# Patient Record
Sex: Male | Born: 1980 | Race: White | Hispanic: Refuse to answer | Marital: Married | Smoking: Light tobacco smoker
Health system: Southern US, Community
[De-identification: ages and names within clinical notes are randomized; demographics above are authoritative.]

## PROBLEM LIST (undated history)

## (undated) DIAGNOSIS — M545 Low back pain, unspecified: Secondary | ICD-10-CM

## (undated) DIAGNOSIS — R42 Dizziness and giddiness: Secondary | ICD-10-CM

## (undated) DIAGNOSIS — G4733 Obstructive sleep apnea (adult) (pediatric): Secondary | ICD-10-CM

## (undated) DIAGNOSIS — G8929 Other chronic pain: Secondary | ICD-10-CM

## (undated) DIAGNOSIS — R270 Ataxia, unspecified: Secondary | ICD-10-CM

## (undated) DIAGNOSIS — Z87442 Personal history of urinary calculi: Secondary | ICD-10-CM

## (undated) HISTORY — PX: KNEE ARTHROSCOPY: SHX127

## (undated) HISTORY — DX: Ataxia, unspecified: R27.0

---

## 2001-05-17 ENCOUNTER — Emergency Department (HOSPITAL_COMMUNITY): Admission: EM | Admit: 2001-05-17 | Discharge: 2001-05-17 | Payer: Self-pay

## 2001-05-17 ENCOUNTER — Encounter: Payer: Self-pay | Admitting: *Deleted

## 2001-11-02 ENCOUNTER — Encounter: Payer: Self-pay | Admitting: Emergency Medicine

## 2001-11-02 ENCOUNTER — Emergency Department (HOSPITAL_COMMUNITY): Admission: EM | Admit: 2001-11-02 | Discharge: 2001-11-02 | Payer: Self-pay | Admitting: Emergency Medicine

## 2005-03-06 ENCOUNTER — Emergency Department (HOSPITAL_COMMUNITY): Admission: EM | Admit: 2005-03-06 | Discharge: 2005-03-07 | Payer: Self-pay | Admitting: Emergency Medicine

## 2006-06-16 ENCOUNTER — Emergency Department (HOSPITAL_COMMUNITY): Admission: EM | Admit: 2006-06-16 | Discharge: 2006-06-17 | Payer: Self-pay | Admitting: Emergency Medicine

## 2010-05-26 ENCOUNTER — Ambulatory Visit (HOSPITAL_COMMUNITY)
Admission: RE | Admit: 2010-05-26 | Discharge: 2010-05-26 | Disposition: A | Discharge status: Home / Self Care | Payer: Worker's Compensation | Source: Ambulatory Visit | Attending: Emergency Medicine | Admitting: Emergency Medicine

## 2010-05-26 DIAGNOSIS — M545 Low back pain, unspecified: Secondary | ICD-10-CM | POA: Insufficient documentation

## 2010-05-26 DIAGNOSIS — M6281 Muscle weakness (generalized): Secondary | ICD-10-CM | POA: Insufficient documentation

## 2010-05-26 DIAGNOSIS — IMO0001 Reserved for inherently not codable concepts without codable children: Secondary | ICD-10-CM | POA: Insufficient documentation

## 2010-05-28 ENCOUNTER — Ambulatory Visit (HOSPITAL_COMMUNITY)
Admission: RE | Admit: 2010-05-28 | Discharge: 2010-05-28 | Disposition: A | Discharge status: Home / Self Care | Payer: Worker's Compensation | Source: Ambulatory Visit | Attending: *Deleted | Admitting: *Deleted

## 2010-06-02 ENCOUNTER — Ambulatory Visit (HOSPITAL_COMMUNITY)
Admission: RE | Admit: 2010-06-02 | Discharge: 2010-06-02 | Disposition: A | Discharge status: Home / Self Care | Payer: Worker's Compensation | Source: Ambulatory Visit | Attending: Emergency Medicine | Admitting: Emergency Medicine

## 2010-06-02 DIAGNOSIS — IMO0001 Reserved for inherently not codable concepts without codable children: Secondary | ICD-10-CM | POA: Insufficient documentation

## 2010-06-02 DIAGNOSIS — M545 Low back pain, unspecified: Secondary | ICD-10-CM | POA: Insufficient documentation

## 2010-06-02 DIAGNOSIS — M6281 Muscle weakness (generalized): Secondary | ICD-10-CM | POA: Insufficient documentation

## 2010-06-04 ENCOUNTER — Ambulatory Visit (HOSPITAL_COMMUNITY)
Admission: RE | Admit: 2010-06-04 | Discharge: 2010-06-04 | Disposition: A | Discharge status: Home / Self Care | Payer: Worker's Compensation | Source: Ambulatory Visit | Attending: Emergency Medicine | Admitting: Emergency Medicine

## 2010-06-04 DIAGNOSIS — M545 Low back pain, unspecified: Secondary | ICD-10-CM | POA: Insufficient documentation

## 2010-06-04 DIAGNOSIS — M6281 Muscle weakness (generalized): Secondary | ICD-10-CM | POA: Insufficient documentation

## 2010-06-04 DIAGNOSIS — IMO0001 Reserved for inherently not codable concepts without codable children: Secondary | ICD-10-CM | POA: Insufficient documentation

## 2010-06-09 ENCOUNTER — Ambulatory Visit (HOSPITAL_COMMUNITY)
Admission: RE | Admit: 2010-06-09 | Discharge: 2010-06-09 | Disposition: A | Discharge status: Home / Self Care | Payer: Worker's Compensation | Source: Ambulatory Visit | Attending: Emergency Medicine | Admitting: Emergency Medicine

## 2010-06-09 DIAGNOSIS — IMO0001 Reserved for inherently not codable concepts without codable children: Secondary | ICD-10-CM | POA: Insufficient documentation

## 2010-06-09 DIAGNOSIS — M6281 Muscle weakness (generalized): Secondary | ICD-10-CM | POA: Insufficient documentation

## 2010-06-09 DIAGNOSIS — M545 Low back pain, unspecified: Secondary | ICD-10-CM | POA: Insufficient documentation

## 2010-06-11 ENCOUNTER — Ambulatory Visit (HOSPITAL_COMMUNITY)
Admission: RE | Admit: 2010-06-11 | Discharge: 2010-06-11 | Disposition: A | Discharge status: Home / Self Care | Payer: Worker's Compensation | Source: Ambulatory Visit | Attending: *Deleted | Admitting: *Deleted

## 2010-06-17 ENCOUNTER — Ambulatory Visit (HOSPITAL_COMMUNITY)
Admission: RE | Admit: 2010-06-17 | Discharge: 2010-06-17 | Disposition: A | Discharge status: Home / Self Care | Payer: Worker's Compensation | Source: Ambulatory Visit | Attending: *Deleted | Admitting: *Deleted

## 2010-06-18 ENCOUNTER — Ambulatory Visit (HOSPITAL_COMMUNITY)
Admission: RE | Admit: 2010-06-18 | Discharge: 2010-06-18 | Disposition: A | Discharge status: Home / Self Care | Payer: Worker's Compensation | Source: Ambulatory Visit | Attending: *Deleted | Admitting: *Deleted

## 2010-06-23 ENCOUNTER — Ambulatory Visit (HOSPITAL_COMMUNITY)
Admission: RE | Admit: 2010-06-23 | Discharge: 2010-06-23 | Disposition: A | Discharge status: Home / Self Care | Payer: Worker's Compensation | Source: Ambulatory Visit | Attending: *Deleted | Admitting: *Deleted

## 2010-06-25 ENCOUNTER — Ambulatory Visit (HOSPITAL_COMMUNITY)
Admission: RE | Admit: 2010-06-25 | Discharge: 2010-06-25 | Disposition: A | Discharge status: Home / Self Care | Payer: Worker's Compensation | Source: Ambulatory Visit | Attending: *Deleted | Admitting: *Deleted

## 2010-06-30 ENCOUNTER — Ambulatory Visit (HOSPITAL_COMMUNITY)
Admission: RE | Admit: 2010-06-30 | Discharge: 2010-06-30 | Disposition: A | Discharge status: Home / Self Care | Payer: Worker's Compensation | Source: Ambulatory Visit | Attending: *Deleted | Admitting: *Deleted

## 2010-07-02 ENCOUNTER — Ambulatory Visit (HOSPITAL_COMMUNITY)
Admission: RE | Admit: 2010-07-02 | Discharge: 2010-07-02 | Disposition: A | Discharge status: Home / Self Care | Payer: Worker's Compensation | Source: Ambulatory Visit | Attending: *Deleted | Admitting: *Deleted

## 2010-07-07 ENCOUNTER — Ambulatory Visit (HOSPITAL_COMMUNITY)
Admission: RE | Admit: 2010-07-07 | Discharge: 2010-07-07 | Disposition: A | Discharge status: Home / Self Care | Payer: Worker's Compensation | Source: Ambulatory Visit | Attending: Physical Therapy | Admitting: Physical Therapy

## 2010-07-07 DIAGNOSIS — M545 Low back pain, unspecified: Secondary | ICD-10-CM | POA: Insufficient documentation

## 2010-07-07 DIAGNOSIS — IMO0001 Reserved for inherently not codable concepts without codable children: Secondary | ICD-10-CM | POA: Insufficient documentation

## 2010-07-07 DIAGNOSIS — M6281 Muscle weakness (generalized): Secondary | ICD-10-CM | POA: Insufficient documentation

## 2010-07-09 ENCOUNTER — Ambulatory Visit (HOSPITAL_COMMUNITY)
Admission: RE | Admit: 2010-07-09 | Discharge: 2010-07-09 | Disposition: A | Discharge status: Home / Self Care | Payer: Worker's Compensation | Source: Ambulatory Visit | Attending: Physical Therapy | Admitting: Physical Therapy

## 2010-07-09 DIAGNOSIS — M6281 Muscle weakness (generalized): Secondary | ICD-10-CM | POA: Insufficient documentation

## 2010-07-09 DIAGNOSIS — M545 Low back pain, unspecified: Secondary | ICD-10-CM | POA: Insufficient documentation

## 2010-07-09 DIAGNOSIS — IMO0001 Reserved for inherently not codable concepts without codable children: Secondary | ICD-10-CM | POA: Insufficient documentation

## 2010-07-14 ENCOUNTER — Ambulatory Visit (HOSPITAL_COMMUNITY): Payer: Self-pay

## 2010-07-16 ENCOUNTER — Ambulatory Visit (HOSPITAL_COMMUNITY)
Admission: RE | Admit: 2010-07-16 | Discharge: 2010-07-16 | Disposition: A | Discharge status: Home / Self Care | Payer: Worker's Compensation | Source: Ambulatory Visit | Attending: *Deleted | Admitting: *Deleted

## 2010-07-20 ENCOUNTER — Ambulatory Visit (HOSPITAL_COMMUNITY): Payer: Worker's Compensation | Admitting: Physical Therapy

## 2010-07-22 ENCOUNTER — Ambulatory Visit (HOSPITAL_COMMUNITY)
Admission: RE | Admit: 2010-07-22 | Discharge: 2010-07-22 | Disposition: A | Discharge status: Home / Self Care | Payer: Worker's Compensation | Source: Ambulatory Visit | Attending: Physical Therapy | Admitting: Physical Therapy

## 2010-07-22 DIAGNOSIS — IMO0001 Reserved for inherently not codable concepts without codable children: Secondary | ICD-10-CM | POA: Insufficient documentation

## 2010-07-22 DIAGNOSIS — M6281 Muscle weakness (generalized): Secondary | ICD-10-CM | POA: Insufficient documentation

## 2010-07-22 DIAGNOSIS — M545 Low back pain, unspecified: Secondary | ICD-10-CM | POA: Insufficient documentation

## 2010-08-21 NOTE — Consult Note (Signed)
NAMEVIRGLE, Clayton Jensen                ACCOUNT NO.:  [id number]   MEDICAL RECORD NO.:  [id number]          PATIENT TYPE:  EMS   LOCATION:  MAJO                         FACILITY:  [facility]   PHYSICIAN:  Dionne Ano. Gramig III, M.D.DATE OF BIRTH:  [birth year]   DATE OF CONSULTATION:  06/17/2006  DATE OF DISCHARGE:  06/17/2006                                 CONSULTATION   The patient is a 30 year old male who sustained an injury to his right  index finger dorsal aspect with a fine coil June 15, 2006 while at  work.  The coil was removed.  Following this, the patient then underwent  a day where he did not have significant problems but noted intermittent  pain.  Today, he was concerned about this and presented to [facility].  He was seen and noted to be afebrile, and there was a  question of possible joint space infection.  I have discussed this with  him at length and reviewed his notes.   The coil-type piece is a refrigerator coil.  He had two punctures on the  dorsal aspect of his hands, and his tetanus shot is up to date as he had  a tetanus shot one year ago.   PAST MEDICAL HISTORY:  Past medical history includes low back pain.   PAST SURGICAL HISTORY:  No major surgeries.   ALLERGIES:  DARVOCET AND PERCOCET ARE DIFFICULT FOR HIM TO TOLERATE;  HOWEVER, VICODIN DOES WELL.   SOCIAL HISTORY:  Smokes one pack per day.  He does not drink.  His has a  fiance who is with him today.   REVIEW OF SYSTEMS:  His 14-point review of systems is negative.   PHYSICAL EXAMINATION:  This patient has a weight of 295 pounds.  He had  stable vital signs.  He is afebrile today.  He has no signs of infection deeply.  He does have active range of  motion.  His flexor apparatus is completely nontender with palpation and  provocative testing.  MCP and DIP joints are nontender.  The PIP has a  slight bit of tenderness with palpation where the two dorsal punctures  occurred distal to that PIP  region of the hand.  The patient does have  some very mild pain with palpation.  Passive range of motion testing is  not particularly tender.  There is no signs of gross instability or  excessive tendon injury.  I have reviewed this at length and the  findings.  His x-rays are reviewed in detail.  These came from  [facility] and I have reviewed them.   IMPRESSION:  Puncture wound dorsal aspect right index finger with pain  and no evidence of advanced joint sepsis at the present time.   PLAN:  I discussed the patient's findings.  At the present time I would  recommend Rocephin 2 g IV which was given today.  Following this, the  patient will be started on Augmentin 875 mg 1 p.o. b.i.d.  I have given  him Vicodin for pain dispensed number 45.  I have asked the  patient to  notify me if there is any swelling, erythema, cellulitis, or worsening.  At the present time, he has no cellulitis, no Kanavel's findings, nor  does he have any puncture wound volarly.  There is no abscess to drain.  It is only a puncture wound  which we  will cover with antibiotics if  there is  no redness or evidence of advanced infectious state.  I have discussed  these issues with him at length and will proceed accordingly.  He  understands to notify me immediately if he has any problems.  Otherwise,  he will return to see me in the office early next week.           ______________________________  Dionne Ano. Everlene Other, M.D.     Nash Mantis  D:  06/17/2006  T:  06/17/2006  Job:  [id number]

## 2014-04-03 ENCOUNTER — Telehealth: Payer: Self-pay | Admitting: Family Medicine

## 2014-04-03 NOTE — Telephone Encounter (Signed)
Pt states he does not get his ear drops through us but he is running out He is currently out an wants to know if we can just send him in a new  Script to [facility],   Neomycin polynyxyn  hydrocoritizone OTIC solution (??) unsure on the name  (steroid ear solution)  Nothing listed in Epic, has NOT been here since 11/13  Advised pt he would most likely need to have an appt He states he is working an is unable to come

## 2014-04-03 NOTE — Telephone Encounter (Signed)
Patient does not have an ear doctor and was transferred to the front desk to schedule appointment.

## 2014-04-03 NOTE — Telephone Encounter (Signed)
Needs to contact his ear doc

## 2014-04-04 ENCOUNTER — Ambulatory Visit: Payer: Self-pay | Admitting: Family Medicine

## 2014-10-17 ENCOUNTER — Emergency Department (HOSPITAL_COMMUNITY): Payer: Managed Care, Other (non HMO)

## 2014-10-17 ENCOUNTER — Emergency Department (HOSPITAL_COMMUNITY)
Admission: EM | Admit: 2014-10-17 | Discharge: 2014-10-17 | Disposition: A | Discharge status: Home / Self Care | Payer: Managed Care, Other (non HMO) | Attending: Emergency Medicine | Admitting: Emergency Medicine

## 2014-10-17 ENCOUNTER — Encounter (HOSPITAL_COMMUNITY): Payer: Self-pay

## 2014-10-17 DIAGNOSIS — N508 Other specified disorders of male genital organs: Secondary | ICD-10-CM | POA: Diagnosis present

## 2014-10-17 DIAGNOSIS — Z720 Tobacco use: Secondary | ICD-10-CM | POA: Diagnosis not present

## 2014-10-17 DIAGNOSIS — N200 Calculus of kidney: Secondary | ICD-10-CM | POA: Insufficient documentation

## 2014-10-17 DIAGNOSIS — R109 Unspecified abdominal pain: Secondary | ICD-10-CM

## 2014-10-17 DIAGNOSIS — N50812 Left testicular pain: Secondary | ICD-10-CM

## 2014-10-17 LAB — BASIC METABOLIC PANEL
Anion gap: 8 (ref 5–15)
BUN: 15 mg/dL (ref 6–20)
CO2: 28 mmol/L (ref 22–32)
Calcium: 8.9 mg/dL (ref 8.9–10.3)
Chloride: 106 mmol/L (ref 101–111)
Creatinine, Ser: 1.24 mg/dL (ref 0.61–1.24)
GLUCOSE: 112 mg/dL — AB (ref 65–99)
POTASSIUM: 3.9 mmol/L (ref 3.5–5.1)
Sodium: 142 mmol/L (ref 135–145)

## 2014-10-17 LAB — URINE MICROSCOPIC-ADD ON

## 2014-10-17 LAB — URINALYSIS, ROUTINE W REFLEX MICROSCOPIC
Bilirubin Urine: NEGATIVE
GLUCOSE, UA: NEGATIVE mg/dL
Ketones, ur: NEGATIVE mg/dL
Leukocytes, UA: NEGATIVE
Nitrite: NEGATIVE
PROTEIN: 30 mg/dL — AB
Specific Gravity, Urine: >1.03 — ABNORMAL HIGH (ref 1.005–1.030)
Urobilinogen, UA: 0.2 mg/dL (ref 0.0–1.0)
pH: 6 (ref 5.0–8.0)

## 2014-10-17 LAB — CBC WITH DIFFERENTIAL/PLATELET
BASOS ABS: 0.1 10*3/uL (ref 0.0–0.1)
BASOS PCT: 0 % (ref 0–1)
EOS PCT: 3 % (ref 0–5)
Eosinophils Absolute: 0.3 10*3/uL (ref 0.0–0.7)
HCT: 41.2 % (ref 39.0–52.0)
Hemoglobin: 14.4 g/dL (ref 13.0–17.0)
LYMPHS ABS: 3.3 10*3/uL (ref 0.7–4.0)
LYMPHS PCT: 29 % (ref 12–46)
MCH: 29 pg (ref 26.0–34.0)
MCHC: 35 g/dL (ref 30.0–36.0)
MCV: 82.9 fL (ref 78.0–100.0)
MONO ABS: 0.9 10*3/uL (ref 0.1–1.0)
Monocytes Relative: 8 % (ref 3–12)
Neutro Abs: 6.9 10*3/uL (ref 1.7–7.7)
Neutrophils Relative %: 60 % (ref 43–77)
Platelets: 194 10*3/uL (ref 150–400)
RBC: 4.97 MIL/uL (ref 4.22–5.81)
RDW: 13.4 % (ref 11.5–15.5)
WBC: 11.5 10*3/uL — ABNORMAL HIGH (ref 4.0–10.5)

## 2014-10-17 MED ORDER — IBUPROFEN 800 MG PO TABS: 800.0000 mg | ORAL_TABLET | Freq: Three times a day (TID) | ORAL | Status: DC | PRN

## 2014-10-17 MED ORDER — SODIUM CHLORIDE 0.9 % IV SOLN
INTRAVENOUS | Status: DC
  Administered 2014-10-17: 03:00:00 via INTRAVENOUS

## 2014-10-17 MED ORDER — OXYCODONE-ACETAMINOPHEN 5-325 MG PO TABS: 1.0000 | ORAL_TABLET | Freq: Four times a day (QID) | ORAL | Status: DC | PRN

## 2014-10-17 MED ORDER — ONDANSETRON 4 MG PO TBDP: 4.0000 mg | ORAL_TABLET | Freq: Three times a day (TID) | ORAL | Status: DC | PRN

## 2014-10-17 MED ORDER — ONDANSETRON HCL 4 MG/2ML IJ SOLN
4.0000 mg | Freq: Once | INTRAMUSCULAR | Status: AC
  Administered 2014-10-17: 4 mg via INTRAVENOUS
  Filled 2014-10-17: qty 2

## 2014-10-17 MED ORDER — MORPHINE SULFATE 4 MG/ML IJ SOLN
4.0000 mg | Freq: Once | INTRAMUSCULAR | Status: AC
  Administered 2014-10-17: 4 mg via INTRAVENOUS
  Filled 2014-10-17: qty 1

## 2014-10-17 MED ORDER — TAMSULOSIN HCL 0.4 MG PO CAPS: 0.4000 mg | ORAL_CAPSULE | Freq: Every day | ORAL | Status: DC

## 2014-10-17 MED ORDER — HYDROMORPHONE HCL 1 MG/ML IJ SOLN
1.0000 mg | Freq: Once | INTRAMUSCULAR | Status: AC
  Administered 2014-10-17: 1 mg via INTRAVENOUS
  Filled 2014-10-17: qty 1

## 2014-10-17 MED ORDER — KETOROLAC TROMETHAMINE 30 MG/ML IJ SOLN
30.0000 mg | Freq: Once | INTRAMUSCULAR | Status: AC
  Administered 2014-10-17: 30 mg via INTRAVENOUS
  Filled 2014-10-17: qty 1

## 2014-10-17 NOTE — ED Notes (Signed)
Pt states he awoke with pain to the left testicle, states he had torsion to the right testicle last year and it self-resolved.

## 2014-10-17 NOTE — Discharge Instructions (Signed)

## 2014-10-17 NOTE — ED Provider Notes (Signed)
TIME SEEN: 2:40 AM   CHIEF COMPLAINT: Left testicle pain  HPI: Pt is a 34 y.o. male with previous history of right testicular torsion that he reports resolved during ultrasound but never followed up with a urologist who presents to the emergency department with sudden onset severe left-sided sharp testicular pain that started just prior to arrival. States this feels similar to his prior torsion. He does have some pain in his lower abdomen as well. No history of prior kidney stone. No dysuria or hematuria. States he was recently treated for urinary tract infection - was on anti-medics for 5 days and finished them yesterday. Patient and wife do not recall the name of this medication. No history of STDs. Is sexually active. Denies penile discharge. Denies gross hematuria. States when he had his urinary tract infection he had fever, elevated white blood cell count and blood in his urine.  ROS: See HPI Constitutional: no fever  Eyes: no drainage  ENT: no runny nose   Cardiovascular:  no chest pain  Resp: no SOB  GI: no vomiting GU: no dysuria Integumentary: no rash  Allergy: no hives  Musculoskeletal: no leg swelling  Neurological: no slurred speech ROS otherwise negative  PAST MEDICAL HISTORY/PAST SURGICAL HISTORY:  History reviewed. No pertinent past medical history.  MEDICATIONS:  Prior to Admission medications   Not on File    ALLERGIES:  No Known Allergies  SOCIAL HISTORY:  History  Substance Use Topics  . Smoking status: Current Every Day Smoker  . Smokeless tobacco: Not on file  . Alcohol Use: Yes    FAMILY HISTORY: No family history on file.  EXAM: BP 147/91 mmHg  Pulse 70  Temp(Src) 97.6 F (36.4 C)  Resp 20  SpO2 100% CONSTITUTIONAL: Alert and oriented and responds appropriately to questions. Appears uncomfortable, afebrile, nontoxic HEAD: Normocephalic EYES: Conjunctivae clear, PERRL ENT: normal nose; no rhinorrhea; moist mucous membranes; pharynx without  lesions noted NECK: Supple, no meningismus, no LAD  CARD: RRR; S1 and S2 appreciated; no murmurs, no clicks, no rubs, no gallops RESP: Normal chest excursion without splinting or tachypnea; breath sounds clear and equal bilaterally; no wheezes, no rhonchi, no rales, no hypoxia or respiratory distress, speaking full sentences ABD/GI: Normal bowel sounds; non-distended; soft, minimally tender in the left lower quadrant, no rebound, no guarding, no peritoneal signs GU:  Normal external genitalia, circumcised male, no blood or discharge at the urethral meatus, right testicle is nontender and appears normal, no scrotal masses, left testicle is tender and appears to be high riding with decreased cremasteric reflex bilaterally, no testicular mass appreciated BACK:  The back appears normal and is non-tender to palpation, there is no CVA tenderness EXT: Normal ROM in all joints; non-tender to palpation; no edema; normal capillary refill; no cyanosis, no calf tenderness or swelling    SKIN: Normal color for age and race; warm NEURO: Moves all extremities equally, sensation to light touch intact diffusely, cranial nerves II through XII intact PSYCH: The patient's mood and manner are appropriate. Grooming and personal hygiene are appropriate.  MEDICAL DECISION MAKING: 2:45 AM  Patient here with concern for testicular torsion. Patient seen immediately upon arrival to the emergency department. Symptoms started just prior to arrival. Patient reports he attempted to reduce a torsion at home now relief. Will give pain medicine and have a called ultrasound technician in emergently for scrotal Doppler. We'll also obtain urinalysis, urine gonorrhea and chlamydia tests. Will obtain labs, give IV pain and nausea medicine.  ED  PROGRESS: 3:40 AM  US performed. Awaiting official radiology read but he does have bilateral blood flow. Labs unremarkable. Urine pending.   4:00 AM  Pt's testicular ultrasound is normal with normal  blood flow. He is still having significant pain after morphine and Dilaudid. We'll obtain CT of his abdomen pelvis to evaluate for possible stone, pyelonephritis. Urinalysis is pending. We'll give Toradol.  4:15 AM  Pt's urine shows hematuria and many bacteria with no other sign of infection. There are also  few squamous cells. Culture pending.  5:00 AM  Pt reports pain significantly improved after Toradol. CT scan shows a distal 3.5 mm left ureteral stone with moderate proximal obstruction. We'll discharge patient home with urine strainer, pain and nausea medication and urology follow-up information. Discussed return precautions. He verbalizes understanding and is comfortable with this plan.    Layla MawKristen N Ward, DO 10/17/14 [phone]

## 2014-10-18 LAB — URINE CULTURE: CULTURE: NO GROWTH

## 2014-10-18 LAB — GC/CHLAMYDIA PROBE AMP (~~LOC~~) NOT AT ARMC
Chlamydia: NEGATIVE
Neisseria Gonorrhea: NEGATIVE

## 2014-10-23 ENCOUNTER — Other Ambulatory Visit: Payer: Self-pay | Admitting: Nurse Practitioner

## 2014-10-23 MED ORDER — SCOPOLAMINE 1 MG/3DAYS TD PT72: 1.0000 | MEDICATED_PATCH | TRANSDERMAL | Status: DC

## 2014-10-23 MED ORDER — AZITHROMYCIN 250 MG PO TABS: ORAL_TABLET | ORAL | Status: DC

## 2015-05-07 ENCOUNTER — Ambulatory Visit (INDEPENDENT_AMBULATORY_CARE_PROVIDER_SITE_OTHER): Payer: Managed Care, Other (non HMO) | Admitting: Nurse Practitioner

## 2015-05-07 ENCOUNTER — Encounter: Payer: Self-pay | Admitting: Nurse Practitioner

## 2015-05-07 VITALS — BP 130/78 | Temp 99.0°F | Wt 367.8 lb

## 2015-05-07 DIAGNOSIS — M1000 Idiopathic gout, unspecified site: Secondary | ICD-10-CM

## 2015-05-07 DIAGNOSIS — H60392 Other infective otitis externa, left ear: Secondary | ICD-10-CM | POA: Diagnosis not present

## 2015-05-07 DIAGNOSIS — M79672 Pain in left foot: Secondary | ICD-10-CM | POA: Diagnosis not present

## 2015-05-07 DIAGNOSIS — M109 Gout, unspecified: Secondary | ICD-10-CM

## 2015-05-07 MED ORDER — NEOMYCIN-POLYMYXIN-HC 3.5-10000-1 OT SUSP: 4.0000 [drp] | Freq: Three times a day (TID) | OTIC | Status: DC

## 2015-05-08 ENCOUNTER — Encounter: Payer: Self-pay | Admitting: Nurse Practitioner

## 2015-05-08 LAB — SEDIMENTATION RATE: Sed Rate: 3 mm/hr (ref 0–15)

## 2015-05-08 LAB — URIC ACID: Uric Acid: 7 mg/dL (ref 3.7–8.6)

## 2015-05-08 NOTE — Progress Notes (Signed)
Subjective:   Presents for complaints of a flareup of his left otitis externa. States this occurs about twice a year. Uses Cortisporin otic drops which helps. No fever. No cough runny nose headache or sore throat. At the very end of the visit as patient was leaving mentioned that he has been having some pain at the base of his left great toe in the "ball of his foot". No specific history of injury. Has been going on for about 9 days. No changes in his footwear. Patient concerned because he has an extreme tolerance to pain , states were some people have 10, his pain would likely be two.  Objective:   BP 130/78 mmHg  Temp(Src) 99 F (37.2 C) (Oral)  Wt 367 lb 12.8 oz (166.833 kg)  NAD. Alert, oriented. Right ear canal erythema noted about halfway down the canal, no drainage. Left ear canal nonerythematous with mild edema. TMs can be visualized bilateral , mild clear effusion, no erythema. Left foot toes warm with good capillary refill. Strong DP pulse.  Mild tenderness noted at the base of the left great toe at the bottom of the foot , mild tenderness along the lateral part , no bunion noted. Tenderness with movement of the left great toe.  Assessment: Otitis, externa, infective, left  Podagra - Plan: Uric acid, Sedimentation rate  Pain in left foot  Plan:  Meds ordered this encounter  Medications  . neomycin-polymyxin-hydrocortisone (CORTISPORIN) 3.5-10000-1 otic suspension    Sig: Place 4 drops into both ears 3 (three) times daily.    Dispense:  10 mL    Refill:  0    Order Specific Question:  Supervising Provider    Answer:  Merlyn Albert [[id number]]    as a precaution, screen for gout. If this test is negative, recommend x-ray and/or referral to podiatry. Call back if ear pain persist.

## 2015-05-12 ENCOUNTER — Other Ambulatory Visit: Payer: Self-pay | Admitting: Nurse Practitioner

## 2015-05-12 ENCOUNTER — Telehealth: Payer: Self-pay | Admitting: Nurse Practitioner

## 2015-05-12 MED ORDER — AMOXICILLIN 500 MG PO CAPS: 500.0000 mg | ORAL_CAPSULE | Freq: Three times a day (TID) | ORAL | Status: DC

## 2015-05-12 NOTE — Telephone Encounter (Signed)
Patient says he was given neomycin-polymyxin-hydrocortisone (CORTISPORIN) 3.5-10000-1 otic suspension on 05/07/15 for an ear infection.  He says he still is having a lot of pain and wants to know if there is something else we can try him on?    [facility]

## 2015-05-12 NOTE — Telephone Encounter (Signed)
I will send in oral antibiotics. Call back by end of week if no better. Also, recommend OTC Nasonex AQ daily as directed for ear pressure and fluid.

## 2015-05-12 NOTE — Telephone Encounter (Signed)
Called patient and informed her per Nathaneil Canary- will send in oral antibiotics. Call back by end of week if no better. Also, recommend OTC Nasonex AQ daily as directed for ear pressure and fluid. Patient verbalized understanding.

## 2015-10-20 ENCOUNTER — Telehealth: Payer: Self-pay

## 2015-10-20 NOTE — Telephone Encounter (Signed)
Patient's wife called and stated that patient just had a screwdriver go through his hand. He is up to date on his tetanus shot and wanted to know if antibiotics could be called in. Per Dr. Lorin PicketScott- patient needs to come in today to be seen for his injuries and antibiotic therapy. Patient stated that he could not come in today because he is working. Advised patient to go to the nearest ER for immediate treatment. Patient verbalized understanding.

## 2015-12-15 ENCOUNTER — Other Ambulatory Visit: Payer: Self-pay | Admitting: Nurse Practitioner

## 2016-09-01 ENCOUNTER — Encounter: Payer: Self-pay | Admitting: Family Medicine

## 2016-09-01 ENCOUNTER — Ambulatory Visit (INDEPENDENT_AMBULATORY_CARE_PROVIDER_SITE_OTHER): Payer: Managed Care, Other (non HMO) | Admitting: Family Medicine

## 2016-09-01 VITALS — BP 136/84 | Ht 72.0 in | Wt 380.7 lb

## 2016-09-01 DIAGNOSIS — I878 Other specified disorders of veins: Secondary | ICD-10-CM | POA: Diagnosis not present

## 2016-09-01 DIAGNOSIS — R5383 Other fatigue: Secondary | ICD-10-CM

## 2016-09-01 DIAGNOSIS — Z13220 Encounter for screening for lipoid disorders: Secondary | ICD-10-CM

## 2016-09-01 DIAGNOSIS — Z131 Encounter for screening for diabetes mellitus: Secondary | ICD-10-CM | POA: Diagnosis not present

## 2016-09-01 MED ORDER — TRIAMTERENE-HCTZ 37.5-25 MG PO CAPS
ORAL_CAPSULE | ORAL | 2 refills | Status: DC
Start: 2016-09-01 — End: 2017-04-08

## 2016-09-01 NOTE — Progress Notes (Signed)
   Subjective:    Patient ID: Clayton HarmanMichael H Jensen, male    DOB: May 04, 1980, 36 y.o.   MRN: [id number]  HPI Patient arrives with c/o swelling in legs for years- worse for 1.5 weeks.  At work currently stationary  Too much standing or sitting cuss ankles to swell  Ankles uncomfortable last wk   uncomfortable  No shortness of breath no chest pain or leg swelling tendency has increased patient's obesity has increased in recent years.  Not a big salt person   Review of Systems No headache, no major weight loss or weight gain, no chest pain no back pain abdominal pain no change in bowel habits complete ROS otherwise negative     Objective:   Physical Exam  Morbid obesity present  Alert vitals stable, NAD. Blood pressure good on repeat. HEENT normal. Lungs clear. Heart regular rate and rhythm. Bilateral ankle edema pulses strong sensation intact      Assessment & Plan:  Impression venous stasis secondary to morbid obesity symptomatic and painful at times plan Dyazide no more than 3 tablets per week symptom care discussed recommend screening blood work plus wellness exam

## 2016-09-30 ENCOUNTER — Encounter: Payer: Managed Care, Other (non HMO) | Admitting: Family Medicine

## 2017-04-04 ENCOUNTER — Emergency Department (HOSPITAL_COMMUNITY): Payer: 59

## 2017-04-04 ENCOUNTER — Encounter (HOSPITAL_COMMUNITY): Payer: Self-pay | Admitting: Emergency Medicine

## 2017-04-04 ENCOUNTER — Other Ambulatory Visit: Payer: Self-pay

## 2017-04-04 ENCOUNTER — Inpatient Hospital Stay (HOSPITAL_COMMUNITY)
Admission: EM | Admit: 2017-04-04 | Discharge: 2017-04-08 | DRG: 098 | Disposition: A | Discharge status: Home / Self Care | Payer: 59 | Attending: Infectious Disease | Admitting: Infectious Disease

## 2017-04-04 ENCOUNTER — Observation Stay (HOSPITAL_COMMUNITY): Payer: 59

## 2017-04-04 DIAGNOSIS — R4182 Altered mental status, unspecified: Secondary | ICD-10-CM

## 2017-04-04 DIAGNOSIS — F17210 Nicotine dependence, cigarettes, uncomplicated: Secondary | ICD-10-CM | POA: Diagnosis present

## 2017-04-04 DIAGNOSIS — R9401 Abnormal electroencephalogram [EEG]: Secondary | ICD-10-CM | POA: Diagnosis present

## 2017-04-04 DIAGNOSIS — K7689 Other specified diseases of liver: Secondary | ICD-10-CM | POA: Diagnosis not present

## 2017-04-04 DIAGNOSIS — Z825 Family history of asthma and other chronic lower respiratory diseases: Secondary | ICD-10-CM

## 2017-04-04 DIAGNOSIS — R739 Hyperglycemia, unspecified: Secondary | ICD-10-CM | POA: Diagnosis present

## 2017-04-04 DIAGNOSIS — Z8669 Personal history of other diseases of the nervous system and sense organs: Secondary | ICD-10-CM

## 2017-04-04 DIAGNOSIS — Z8661 Personal history of infections of the central nervous system: Secondary | ICD-10-CM | POA: Diagnosis present

## 2017-04-04 DIAGNOSIS — Z87442 Personal history of urinary calculi: Secondary | ICD-10-CM

## 2017-04-04 DIAGNOSIS — A86 Unspecified viral encephalitis: Principal | ICD-10-CM | POA: Diagnosis present

## 2017-04-04 DIAGNOSIS — F064 Anxiety disorder due to known physiological condition: Secondary | ICD-10-CM | POA: Diagnosis present

## 2017-04-04 DIAGNOSIS — R270 Ataxia, unspecified: Secondary | ICD-10-CM | POA: Diagnosis not present

## 2017-04-04 DIAGNOSIS — G4733 Obstructive sleep apnea (adult) (pediatric): Secondary | ICD-10-CM | POA: Diagnosis present

## 2017-04-04 DIAGNOSIS — R945 Abnormal results of liver function studies: Secondary | ICD-10-CM

## 2017-04-04 DIAGNOSIS — R740 Nonspecific elevation of levels of transaminase and lactic acid dehydrogenase [LDH]: Secondary | ICD-10-CM | POA: Diagnosis present

## 2017-04-04 DIAGNOSIS — Z833 Family history of diabetes mellitus: Secondary | ICD-10-CM

## 2017-04-04 DIAGNOSIS — G0490 Encephalitis and encephalomyelitis, unspecified: Secondary | ICD-10-CM | POA: Diagnosis present

## 2017-04-04 DIAGNOSIS — Z6841 Body Mass Index (BMI) 40.0 and over, adult: Secondary | ICD-10-CM

## 2017-04-04 DIAGNOSIS — R160 Hepatomegaly, not elsewhere classified: Secondary | ICD-10-CM | POA: Diagnosis present

## 2017-04-04 DIAGNOSIS — R2681 Unsteadiness on feet: Secondary | ICD-10-CM | POA: Diagnosis present

## 2017-04-04 DIAGNOSIS — R7401 Elevation of levels of liver transaminase levels: Secondary | ICD-10-CM

## 2017-04-04 DIAGNOSIS — G9340 Encephalopathy, unspecified: Secondary | ICD-10-CM | POA: Diagnosis present

## 2017-04-04 DIAGNOSIS — G250 Essential tremor: Secondary | ICD-10-CM

## 2017-04-04 HISTORY — DX: Obstructive sleep apnea (adult) (pediatric): G47.33

## 2017-04-04 HISTORY — DX: Altered mental status, unspecified: R41.82

## 2017-04-04 HISTORY — DX: Other chronic pain: G89.29

## 2017-04-04 HISTORY — DX: Low back pain, unspecified: M54.50

## 2017-04-04 HISTORY — DX: Personal history of urinary calculi: Z87.442

## 2017-04-04 HISTORY — DX: Low back pain: M54.5

## 2017-04-04 LAB — COMPREHENSIVE METABOLIC PANEL
ALT: 133 U/L — AB (ref 17–63)
AST: 91 U/L — AB (ref 15–41)
Albumin: 3.4 g/dL — ABNORMAL LOW (ref 3.5–5.0)
Alkaline Phosphatase: 70 U/L (ref 38–126)
Anion gap: 10 (ref 5–15)
BUN: 11 mg/dL (ref 6–20)
CHLORIDE: 104 mmol/L (ref 101–111)
CO2: 25 mmol/L (ref 22–32)
CREATININE: 0.77 mg/dL (ref 0.61–1.24)
Calcium: 8.9 mg/dL (ref 8.9–10.3)
GFR calc non Af Amer: >60 mL/min (ref >60)
Glucose, Bld: 107 mg/dL — ABNORMAL HIGH (ref 65–99)
POTASSIUM: 3.7 mmol/L (ref 3.5–5.1)
SODIUM: 139 mmol/L (ref 135–145)
Total Bilirubin: 0.9 mg/dL (ref 0.3–1.2)
Total Protein: 7.1 g/dL (ref 6.5–8.1)

## 2017-04-04 LAB — AMMONIA: AMMONIA: 38 umol/L — AB (ref 9–35)

## 2017-04-04 LAB — CBC WITH DIFFERENTIAL/PLATELET
Basophils Absolute: 0 10*3/uL (ref 0.0–0.1)
Basophils Relative: 0 %
EOS ABS: 0.1 10*3/uL (ref 0.0–0.7)
Eosinophils Relative: 1 %
HCT: 44 % (ref 39.0–52.0)
HEMOGLOBIN: 15 g/dL (ref 13.0–17.0)
LYMPHS ABS: 2.8 10*3/uL (ref 0.7–4.0)
LYMPHS PCT: 31 %
MCH: 29 pg (ref 26.0–34.0)
MCHC: 34.1 g/dL (ref 30.0–36.0)
MCV: 85.1 fL (ref 78.0–100.0)
MONOS PCT: 6 %
Monocytes Absolute: 0.6 10*3/uL (ref 0.1–1.0)
NEUTROS PCT: 62 %
Neutro Abs: 5.6 10*3/uL (ref 1.7–7.7)
Platelets: 160 10*3/uL (ref 150–400)
RBC: 5.17 MIL/uL (ref 4.22–5.81)
RDW: 14.1 % (ref 11.5–15.5)
WBC: 9.2 10*3/uL (ref 4.0–10.5)

## 2017-04-04 LAB — URINALYSIS, ROUTINE W REFLEX MICROSCOPIC
BACTERIA UA: NONE SEEN
BILIRUBIN URINE: NEGATIVE
Glucose, UA: NEGATIVE mg/dL
KETONES UR: NEGATIVE mg/dL
Leukocytes, UA: NEGATIVE
Nitrite: NEGATIVE
PROTEIN: 100 mg/dL — AB
Specific Gravity, Urine: 1.017 (ref 1.005–1.030)
pH: 6 (ref 5.0–8.0)

## 2017-04-04 LAB — CBG MONITORING, ED: GLUCOSE-CAPILLARY: 102 mg/dL — AB (ref 65–99)

## 2017-04-04 LAB — RAPID URINE DRUG SCREEN, HOSP PERFORMED
Amphetamines: NOT DETECTED
BENZODIAZEPINES: NOT DETECTED
Barbiturates: NOT DETECTED
COCAINE: NOT DETECTED
OPIATES: NOT DETECTED
Tetrahydrocannabinol: NOT DETECTED

## 2017-04-04 LAB — MAGNESIUM: MAGNESIUM: 1.6 mg/dL — AB (ref 1.7–2.4)

## 2017-04-04 LAB — SEDIMENTATION RATE: Sed Rate: 6 mm/hr (ref 0–16)

## 2017-04-04 LAB — TSH: TSH: 3.859 u[IU]/mL (ref 0.350–4.500)

## 2017-04-04 LAB — VITAMIN B12: Vitamin B-12: 646 pg/mL (ref 180–914)

## 2017-04-04 LAB — ETHANOL: Alcohol, Ethyl (B): <10 mg/dL (ref <10)

## 2017-04-04 MED ORDER — SODIUM CHLORIDE 0.9 % IV BOLUS (SEPSIS)
1000.0000 mL | Freq: Once | INTRAVENOUS | Status: AC
  Administered 2017-04-04: 1000 mL via INTRAVENOUS

## 2017-04-04 MED ORDER — LORAZEPAM 2 MG/ML IJ SOLN: 2.0000 mg | Freq: Once | INTRAMUSCULAR | Status: DC

## 2017-04-04 MED ORDER — MECLIZINE HCL 12.5 MG PO TABS
25.0000 mg | ORAL_TABLET | Freq: Once | ORAL | Status: AC
  Administered 2017-04-04: 25 mg via ORAL
  Filled 2017-04-04: qty 2

## 2017-04-04 MED ORDER — SODIUM CHLORIDE 0.9 % IV SOLN
INTRAVENOUS | Status: DC
  Administered 2017-04-04 – 2017-04-08 (×6): via INTRAVENOUS

## 2017-04-04 NOTE — ED Notes (Addendum)
[facility] [place],RN at this time. Pt left Private vehicle transfer to [facility]

## 2017-04-04 NOTE — ED Notes (Signed)
Patient transported to MRI 

## 2017-04-04 NOTE — ED Triage Notes (Signed)
Pt sent here from [facility] as transfer for MRI. Family reports pt being fatigued and not acting himself since Sunday morning. Appears in no acute distress at this time.

## 2017-04-04 NOTE — ED Notes (Signed)
MRI called concerning patient having extreme claustrophobia.  Patient wants to talk with provider before having MRI done again.  Patient will be brought back to see PA before having MRI done.

## 2017-04-04 NOTE — H&P (Signed)
TRH H&P   Patient Demographics:    Clayton Jensen, is a 36 y.o. male  MRN: [id number]   DOB - 1980-09-21  Admit Date - 04/04/2017  Outpatient Primary MD for the patient is Wolfgang Phoenix Grace Bushy, MD  Referring MD/NP/PA:   Margarita Mail  Outpatient Specialists:     Patient coming from: home  Chief Complaint  Patient presents with  . Dizziness      HPI:    Clayton Jensen  is a 36 y.o. male, started feeling tired and head felt swimmy on Saturday nite.  Sunday stayed home , slept most of the day. This am his wife woke him up and he got up groggy and something happened in the shower,  He hurt his left bumping on the bathtub.  His wife went to check on him and he was putting on 5 shirts. Went to [employer] due to confusion per family.  He apparently thought it was February earlier.  axox2 (person, place).    In ED,  MRI brain=> no acute process Urinalysis negative  Na 139, K 3.7 Ast 91, Alt 133 Alb 3.4   Pt will be admitted for observation of altered mental status.  ?     Review of systems:    In addition to the HPI above,  No Fever-chills, No Headache, No changes with Vision or hearing, No problems swallowing food or Liquids, No Chest pain, Cough or Shortness of Breath, No Abdominal pain, No Nausea or Vommitting, Bowel movements are regular, No Blood in stool or Urine, No dysuria, No new skin rashes or bruises, No new joints pains-aches,  No new weakness, tingling, numbness in any extremity, No recent weight gain or loss, No polyuria, polydypsia or polyphagia, No significant Mental Stressors.  A full 10 point Review of Systems was done, except as stated above, all other Review of Systems were negative.   With Past History of the following :    Past Medical History:  Diagnosis Date  . OSA (obstructive sleep apnea)    clinical diagnosis, not officially diagnosed  per family      Past Surgical History:  Procedure Laterality Date  . KNEE SURGERY Left       Social History:     Social History   Tobacco Use  . Smoking status: Current Every Day Smoker  . Smokeless tobacco: Never Used  Substance Use Topics  . Alcohol use: No    Frequency: Never     Lives - at home with wife  Mobility - walks by  Self.     Family History :     Family History  Problem Relation Age of Onset  . COPD Mother   . Atrial fibrillation Mother   . Diabetes Mother   . Diabetes Father       Home Medications:   Prior to Admission medications   Medication Sig Start  Date End Date Taking? Authorizing Provider  triamterene-hydrochlorothiazide (DYAZIDE) 37.5-25 MG capsule One tablet up to 3 times per week as needed for swelling 09/01/16  Yes Mikey Kirschner, MD     Allergies:    No Known Allergies   Physical Exam:   Vitals  Blood pressure 138/74, pulse 68, temperature 98 F (36.7 C), temperature source Oral, resp. rate 16, height 6' 1"  (1.854 m), weight (!) 154.2 kg (340 lb), SpO2 95 %.   1. General lying in bed in NAD,    2. Normal affect and insight, Not Suicidal or Homicidal, Awake Alert, Oriented X 2  3. No F.N deficits, ALL C.Nerves Intact, Strength 5/5 all 4 extremities, Sensation intact all 4 extremities, Plantars down going.  4. Ears and Eyes appear Normal, Conjunctivae clear, PERRLA. Moist Oral Mucosa.  5. Supple Neck, No JVD, No cervical lymphadenopathy appriciated, No Carotid Bruits.  6. Symmetrical Chest wall movement, Good air movement bilaterally, CTAB.  7. RRR, No Gallops, Rubs or Murmurs, No Parasternal Heave.  8. Positive Bowel Sounds, Abdomen Soft, No tenderness, No organomegaly appriciated,No rebound -guarding or rigidity.  9.  No Cyanosis, Normal Skin Turgor, No Skin Rash or Bruise.  10. Good muscle tone,  joints appear normal , no effusions, Normal ROM.  11. No Palpable Lymph Nodes in Neck or Axillae     Data  Review:    CBC Recent Labs  Lab 04/04/17 0759  WBC 9.2  HGB 15.0  HCT 44.0  PLT 160  MCV 85.1  MCH 29.0  MCHC 34.1  RDW 14.1  LYMPHSABS 2.8  MONOABS 0.6  EOSABS 0.1  BASOSABS 0.0   ------------------------------------------------------------------------------------------------------------------  Chemistries  Recent Labs  Lab 04/04/17 0759  NA 139  K 3.7  CL 104  CO2 25  GLUCOSE 107*  BUN 11  CREATININE 0.77  CALCIUM 8.9  AST 91*  ALT 133*  ALKPHOS 70  BILITOT 0.9   ------------------------------------------------------------------------------------------------------------------ estimated creatinine clearance is 194.1 mL/min (by C-G formula based on SCr of 0.77 mg/dL). ------------------------------------------------------------------------------------------------------------------ No results for input(s): TSH, T4TOTAL, T3FREE, THYROIDAB in the last 72 hours.  Invalid input(s): FREET3  Coagulation profile No results for input(s): INR, PROTIME in the last 168 hours. ------------------------------------------------------------------------------------------------------------------- No results for input(s): DDIMER in the last 72 hours. -------------------------------------------------------------------------------------------------------------------  Cardiac Enzymes No results for input(s): CKMB, TROPONINI, MYOGLOBIN in the last 168 hours.  Invalid input(s): CK ------------------------------------------------------------------------------------------------------------------ No results found for: BNP   ---------------------------------------------------------------------------------------------------------------  Urinalysis    Component Value Date/Time   COLORURINE YELLOW 04/04/2017 [place] 04/04/2017 0832   LABSPEC 1.017 04/04/2017 0832   PHURINE 6.0 04/04/2017 0832   GLUCOSEU NEGATIVE 04/04/2017 0832   HGBUR SMALL (A) 04/04/2017 0832     BILIRUBINUR NEGATIVE 04/04/2017 0832   KETONESUR NEGATIVE 04/04/2017 0832   PROTEINUR 100 (A) 04/04/2017 0832   UROBILINOGEN 0.2 10/17/2014 0350   NITRITE NEGATIVE 04/04/2017 0832   LEUKOCYTESUR NEGATIVE 04/04/2017 0832    ----------------------------------------------------------------------------------------------------------------   Imaging Results:    Dg Chest 2 View  Result Date: 04/04/2017 CLINICAL DATA:  Altered mental status EXAM: CHEST  2 VIEW COMPARISON:  None. FINDINGS: Lungs are clear. Heart size and pulmonary vascularity are normal. No adenopathy. No bone lesions. IMPRESSION: No edema or consolidation. Electronically Signed   By: Lowella Grip III M.D.   On: 04/04/2017 09:08   Ct Head Wo Contrast  Result Date: 04/04/2017 CLINICAL DATA:  36 year old male with headache since waking yesterday. Nausea and malaise. Odd behavior reported by family.  Bilateral lower extremity swelling. EXAM: CT HEAD WITHOUT CONTRAST TECHNIQUE: Contiguous axial images were obtained from the base of the skull through the vertex without intravenous contrast. COMPARISON:  Skull radiographs 03/28/2007. FINDINGS: Brain: Cerebral volume is within normal limits. No midline shift, ventriculomegaly, mass effect, evidence of mass lesion, intracranial hemorrhage or evidence of cortically based acute infarction. Gray-white matter differentiation is within normal limits throughout the brain. Vascular: No suspicious intracranial vascular hyperdensity. Skull: No osseous abnormality identified. Sinuses/Orbits: Only minor paranasal sinus mucosal thickening. Tympanic cavities and mastoids are clear. Other: Visualized orbits and scalp soft tissues are within normal limits. IMPRESSION: Negative head CT.  Normal noncontrast CT appearance of the brain. Electronically Signed   By: Genevie Ann M.D.   On: 04/04/2017 08:40   Mr Brain Wo Contrast  Result Date: 04/04/2017 CLINICAL DATA:  Initial evaluation for acute altered  mental status. EXAM: MRI HEAD WITHOUT CONTRAST TECHNIQUE: Multiplanar, multiecho pulse sequences of the brain and surrounding structures were obtained without intravenous contrast. COMPARISON:  [facility] CT from earlier the same day. FINDINGS: Brain: Study is severely limited as the patient was unable to tolerate the exam due to severe claustrophobia. Ulna knee axial diffusion-weighted sequence was performed. Diffusion-weighted imaging demonstrates no evidence for acute or subacute ischemia. Gray-white matter differentiation grossly maintained. No obvious mass lesion. No midline shift or mass effect. No hydrocephalus. No appreciable extra-axial fluid collection. Vascular: Not evaluated on this limited exam. Skull and upper cervical spine: No appreciable scalp edema or other abnormality. Sinuses/Orbits: Globes orbital soft tissues as well as the paranasal sinuses not well evaluated on this limited exam. Other: None. IMPRESSION: 1. Limited study with only axial diffusion-weighted sequence performed. 2. No acute intracranial infarct. No other definite intracranial abnormality identified on this limited exam. Electronically Signed   By: Jeannine Boga M.D.   On: 04/04/2017 20:23     Assessment & Plan:    Principal Problem:   Altered mental status    AMS Check b12, folate, esr, ana, rpr, tsh, ammonia Neurology consult appreciated  Abnormal liver function Check acute hepatitis panel RUQ ultrasound  Hyperglycemia Check hga1c   DVT Prophylaxis  Lovenox - SCDs   AM Labs Ordered, also please review Full Orders  Family Communication: Admission, patients condition and plan of care including tests being ordered have been discussed with the patient  who indicate understanding and agree with the plan and Code Status.  Code Status FULL CODE  Likely DC to  home  Condition GUARDED    Consults called: neurology per ED  Admission status: observation  Time spent in minutes : 45   Jani Gravel M.D  on 04/04/2017 at 8:54 PM  Between 7am to 7pm - Pager - [phone]   After 7pm go to [email or web address] - password [facility]  [facility] - Office  [phone]

## 2017-04-04 NOTE — ED Provider Notes (Signed)
[facility] EMERGENCY DEPARTMENT Provider Note   CSN: [id number] Arrival date & time: 04/04/17  7829     History   Chief Complaint Chief Complaint  Patient presents with  . Dizziness    HPI Clayton Jensen is a 36 y.o. male.  Pt presents to the ED today with altered mental status.  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  They have been traveling and just got back last night.  When he got home, he said he did not feel well and went to sleep.  He did not eat much today.  They have a lot to do today, so the wife got him up and told him to get dressed.  When she came back to the room, he had just put on 5 shirts.  She said he could not remember how to get dressed.  The pt does not remember this event.  He said he feels dizzy and has a h/a, but denies any other pain.      History reviewed. No pertinent past medical history.  There are no active problems to display for this patient.   History reviewed. No pertinent surgical history.     Home Medications    Prior to Admission medications   Medication Sig Start Date End Date Taking? Authorizing Provider  triamterene-hydrochlorothiazide (DYAZIDE) 37.5-25 MG capsule One tablet up to 3 times per week as needed for swelling 09/01/16  Yes Merlyn Albert, MD    Family History History reviewed. No pertinent family history.  Social History Social History   Tobacco Use  . Smoking status: Current Every Day Smoker  . Smokeless tobacco: Never Used  Substance Use Topics  . Alcohol use: Yes  . Drug use: No     Allergies   Patient has no known allergies.   Review of Systems Review of Systems  Neurological: Positive for dizziness and headaches.  All other systems reviewed and are negative.    Physical Exam Updated Vital Signs BP 122/71   Pulse 67   Temp 98 F (36.7 C) (Oral)   Resp 19   Ht 6\' 1"  (1.854 m)   Wt (!) 154.2 kg  (340 lb)   SpO2 96%   BMI 44.86 kg/m   Physical Exam  Constitutional: He is oriented to person, place, and time. He appears well-developed and well-nourished.  HENT:  Head: Normocephalic and atraumatic.  Right Ear: External ear normal.  Left Ear: External ear normal.  Nose: Nose normal.  Mouth/Throat: Oropharynx is clear and moist.  Eyes: Conjunctivae and EOM are normal. Pupils are equal, round, and reactive to light.  Neck: Normal range of motion. Neck supple.  Cardiovascular: Normal rate, regular rhythm, normal heart sounds and intact distal pulses.  Pulmonary/Chest: Effort normal and breath sounds normal.  Abdominal: Soft. Bowel sounds are normal.  Musculoskeletal: Normal range of motion.  Neurological: He is alert and oriented to person, place, and time.  Skin: Skin is warm and dry. Capillary refill takes less than 2 seconds.  Psychiatric: He has a normal mood and affect. His behavior is normal. Judgment and thought content normal.  Nursing note and vitals reviewed.    ED Treatments / Results  Labs (all labs ordered are listed, but only abnormal results are displayed) Labs Reviewed  COMPREHENSIVE METABOLIC PANEL - Abnormal; Notable for the following components:      Result Value   Glucose, Bld  107 (*)    Albumin 3.4 (*)    AST 91 (*)    ALT 133 (*)    All other components within normal limits  URINALYSIS, ROUTINE W REFLEX MICROSCOPIC - Abnormal; Notable for the following components:   Hgb urine dipstick SMALL (*)    Protein, ur 100 (*)    Squamous Epithelial / LPF 0-5 (*)    All other components within normal limits  CBG MONITORING, ED - Abnormal; Notable for the following components:   Glucose-Capillary 102 (*)    All other components within normal limits  CBC WITH DIFFERENTIAL/PLATELET  RAPID URINE DRUG SCREEN, HOSP PERFORMED    EKG  EKG Interpretation  Date/Time:  Monday April 04 2017 07:59:27 EST Ventricular Rate:  75 PR Interval:    QRS  Duration: 101 QT Interval:  390 QTC Calculation: 436 R Axis:   35 Text Interpretation:  Sinus rhythm Probable anteroseptal infarct, old Confirmed by Jacalyn LefevreHaviland, Mei Suits [phone]) on 04/04/2017 9:12:04 AM       Radiology Dg Chest 2 View  Result Date: 04/04/2017 CLINICAL DATA:  Altered mental status EXAM: CHEST  2 VIEW COMPARISON:  None. FINDINGS: Lungs are clear. Heart size and pulmonary vascularity are normal. No adenopathy. No bone lesions. IMPRESSION: No edema or consolidation. Electronically Signed   By: Bretta BangWilliam  Woodruff III M.D.   On: 04/04/2017 09:08   Ct Head Wo Contrast  Result Date: 04/04/2017 CLINICAL DATA:  36 year old male with headache since waking yesterday. Nausea and malaise. Odd behavior reported by family. Bilateral lower extremity swelling. EXAM: CT HEAD WITHOUT CONTRAST TECHNIQUE: Contiguous axial images were obtained from the base of the skull through the vertex without intravenous contrast. COMPARISON:  Skull radiographs 03/28/2007. FINDINGS: Brain: Cerebral volume is within normal limits. No midline shift, ventriculomegaly, mass effect, evidence of mass lesion, intracranial hemorrhage or evidence of cortically based acute infarction. Gray-white matter differentiation is within normal limits throughout the brain. Vascular: No suspicious intracranial vascular hyperdensity. Skull: No osseous abnormality identified. Sinuses/Orbits: Only minor paranasal sinus mucosal thickening. Tympanic cavities and mastoids are clear. Other: Visualized orbits and scalp soft tissues are within normal limits. IMPRESSION: Negative head CT.  Normal noncontrast CT appearance of the brain. Electronically Signed   By: Odessa FlemingH  Hall M.D.   On: 04/04/2017 08:40    Procedures Procedures (including critical care time)  Medications Ordered in ED Medications  sodium chloride 0.9 % bolus 1,000 mL (0 mLs Intravenous Stopped 04/04/17 1008)    And  0.9 %  sodium chloride infusion ( Intravenous New Bag/Given  04/04/17 0820)  sodium chloride 0.9 % bolus 1,000 mL (0 mLs Intravenous Stopped 04/04/17 1120)  meclizine (ANTIVERT) tablet 25 mg (25 mg Oral Given 04/04/17 1009)     Initial Impression / Assessment and Plan / ED Course  I have reviewed the triage vital signs and the nursing notes.  Pertinent labs & imaging results that were available during my care of the patient were reviewed by me and considered in my medical decision making (see chart for details).     Pt able to ambulate, but he is unable to give correct phone numbers.  He is unable to work his phone.  Due to this, I ordered a MRI, but he was unable to fit.  He was d/w Dr. Erin HearingMessner [facility].  Final Clinical Impressions(s) / ED Diagnoses   Final diagnoses:  Altered mental status, unspecified altered mental status type    ED Discharge Orders  None       Jacalyn LefevreHaviland, Shamarcus Hoheisel, MD 04/04/17 1210

## 2017-04-04 NOTE — ED Notes (Signed)
Pt advised MRI states they were sending a tech.

## 2017-04-04 NOTE — ED Notes (Signed)
Pt ambulated in hall without difficulty. Pt states dizziness is better. Pt answering questions correctly but laughing inappropriately.

## 2017-04-04 NOTE — ED Notes (Signed)
F/u with MRI, pt posted for previously. Awaiting transport to take pt to scan. Pt and family aware of delay. Pt and family visibly irritated. Apologized for delay and explained the length MRI takes.

## 2017-04-04 NOTE — ED Notes (Signed)
EDP at bedside  

## 2017-04-04 NOTE — ED Notes (Signed)
Per pt family, pt severely anxious regarding MRI. This RN called MRI and confirmed there is no larger machine and pt was placed in the largest machine available. Per Cammy CopaAbigail, PA enough of pt's MRI was completed while pt was in the machine so pt does not have to have MRI repeated. This RN assured pt and family he would not have to be scanned a second time and pt encouraged to stay. Pt calm and cooperative at this time. Respiration even and unlabored. Admitting at the bedside

## 2017-04-04 NOTE — ED Notes (Signed)
Pt asking about wait time. 

## 2017-04-04 NOTE — ED Triage Notes (Signed)
Pt reports headache since waking up yesterday. Pt denies headache today but reports "I dont feel right." pt reports general malaise and nausea for last several days. Pt family reports "odd behavior" this am while dressing and reports BLE swelling. Pt alert and answers triage questions appropriately.

## 2017-04-04 NOTE — Consult Note (Signed)
NEURO HOSPITALIST CONSULT NOTE   Requestig physician: Dr. Selena BattenKim  Reason for Consult: Confusion  History obtained from:  Patient, Family and Chart     HPI:                                                                                                                                          Clayton Jensen is an 36 y.o. male presenting with confusion. 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 He also works full time and has spent his vacation on the road - he has been up about 18 hours per day with most recent bedtime at 4 AM rather than his usual 11 PM bedtime. He has been chronically fatigued per family. He started to feel unwell this weekend. Family noted him to be walking as though drunk, but he had not consumed alcohol or other illicit substance. Was confused on Sunday night, which worsened on Monday morning when his wife noted him to have put on 6 shirts total while not wearing any bottom clothing and with one shoe on. At least one of the shirts was on backwards. When she spoke to the patient, he was not dysarthric and speech was fluent, but he was confused and having trouble putting thoughts together. He was brought to the ED for further assessment.   He snores and gasps at night and this has been going on for awhile. He is morbidly obese. His wife suspects he may have OSA, but he has not had a formal sleep study. He often is confused on awakening, but he was significantly more confused with the above episode, which was also unusual in that it persisted. LFTs were mildly elevated on initial ED lab assessment. Additional lab work up for encephalopathy has been ordered by hospitalist service.   The patient and family deny any jerking or twitching movements.   Past Medical History:  Diagnosis Date  . OSA (obstructive sleep apnea)    clinical diagnosis, not officially  diagnosed per family    Past Surgical History:  Procedure Laterality Date  . KNEE SURGERY Left     Family History  Problem Relation Age of Onset  . COPD Mother   . Atrial fibrillation Mother   . Diabetes Mother   . Diabetes Father     Social History:  reports that he has been smoking.  he has never used smokeless tobacco. He reports that he does not drink alcohol or use drugs.  No Known Allergies  HOME MEDICATIONS:  Dyazide  ROS:                                                                                                                                       Positive for mild headache. No neck pain or stiffness. No vision changes, dysarthria, facial droop, limb weakness or limb numbness.    Blood pressure (!) 145/81, pulse 71, temperature 98 F (36.7 C), temperature source Oral, resp. rate (!) 30, height 6\' 1"  (1.854 m), weight (!) 154.2 kg (340 lb), SpO2 96 %.   General Examination:                                                                                                      General: Morbidly obese HEENT-  [place]/AT    Lungs- Respirations unlabored Extremities- Subtle pigmentary changes to skin below knees in conjunction with mild pretibial edema  Neurological Examination Mental Status: Alert. Pleasant and cooperative. Speech fluent without dysarthria. Naming and repetition intact. Able to follow all commands. Able to add and multiply normally, but became confused with 12 x 12. Oriented to self, situation, city and state. But not month, day of the week or year.  Cranial Nerves: II: Visual fields intact. No extinction to DSS. PERRL.   III,IV, VI: Ptosis not present, EOMI without nystagmus V,VII: Smile symmetric, facial temp sensation equal bilaterally VIII: hearing intact to conversation IX,X: No hypophonia or hoarseness XI: Symmetric shoulder  shrug XII: Midline tongue extension Motor: Right : Upper extremity   5/5    Left:     Upper extremity   5/5  Lower extremity   5/5     Lower extremity   5/5 Tone and bulk:normal tone throughout; no atrophy noted No pronator drift Equivocal/subtle asterixis left wrist Sensory: Patchy decreased sensation to temperature LUE and RLE. FT intact x 4 without extinction.  Deep Tendon Reflexes: 2+ and symmetric throughout Plantars: Right: downgoing   Left: downgoing Cerebellar: Mild ataxia with FNF bilaterally. Mild ataxia with right heel-shin.  Gait: Deferred  Lab Results: Basic Metabolic Panel: Recent Labs  Lab 04/04/17 0759  NA 139  K 3.7  CL 104  CO2 25  GLUCOSE 107*  BUN 11  CREATININE 0.77  CALCIUM 8.9    Liver Function Tests: Recent Labs  Lab 04/04/17 0759  AST 91*  ALT 133*  ALKPHOS 70  BILITOT 0.9  PROT 7.1  ALBUMIN 3.4*   No results for input(s): LIPASE, AMYLASE in the last 168 hours. Recent Labs  Lab 04/04/17 2100  AMMONIA 38*    CBC: Recent Labs  Lab 04/04/17 0759  WBC 9.2  NEUTROABS 5.6  HGB 15.0  HCT 44.0  MCV 85.1  PLT 160    Cardiac Enzymes: No results for input(s): CKTOTAL, CKMB, CKMBINDEX, TROPONINI in the last 168 hours.  Lipid Panel: No results for input(s): CHOL, TRIG, HDL, CHOLHDL, VLDL, LDLCALC in the last 168 hours.  CBG: Recent Labs  Lab 04/04/17 0807  GLUCAP 102*    Microbiology: Results for orders placed or performed during the hospital encounter of 10/17/14  Urine culture     Status: None   Collection Time: 10/17/14  3:50 AM  Result Value Ref Range Status   Specimen Description URINE, CLEAN CATCH  Final   Special Requests NONE  Final   Culture   Final    NO GROWTH 1 DAY Performed at [facility]    Report Status 10/18/2014 FINAL  Final    Coagulation Studies: No results for input(s): LABPROT, INR in the last 72 hours.  Imaging: Dg Chest 2 View  Result Date: 04/04/2017 CLINICAL DATA:  Altered mental  status EXAM: CHEST  2 VIEW COMPARISON:  None. FINDINGS: Lungs are clear. Heart size and pulmonary vascularity are normal. No adenopathy. No bone lesions. IMPRESSION: No edema or consolidation. Electronically Signed   By: Bretta Bang III M.D.   On: 04/04/2017 09:08   Ct Head Wo Contrast  Result Date: 04/04/2017 CLINICAL DATA:  36 year old male with headache since waking yesterday. Nausea and malaise. Odd behavior reported by family. Bilateral lower extremity swelling. EXAM: CT HEAD WITHOUT CONTRAST TECHNIQUE: Contiguous axial images were obtained from the base of the skull through the vertex without intravenous contrast. COMPARISON:  Skull radiographs 03/28/2007. FINDINGS: Brain: Cerebral volume is within normal limits. No midline shift, ventriculomegaly, mass effect, evidence of mass lesion, intracranial hemorrhage or evidence of cortically based acute infarction. Gray-white matter differentiation is within normal limits throughout the brain. Vascular: No suspicious intracranial vascular hyperdensity. Skull: No osseous abnormality identified. Sinuses/Orbits: Only minor paranasal sinus mucosal thickening. Tympanic cavities and mastoids are clear. Other: Visualized orbits and scalp soft tissues are within normal limits. IMPRESSION: Negative head CT.  Normal noncontrast CT appearance of the brain. Electronically Signed   By: Odessa Fleming M.D.   On: 04/04/2017 08:40   Mr Brain Wo Contrast  Result Date: 04/04/2017 CLINICAL DATA:  Initial evaluation for acute altered mental status. EXAM: MRI HEAD WITHOUT CONTRAST TECHNIQUE: Multiplanar, multiecho pulse sequences of the brain and surrounding structures were obtained without intravenous contrast. COMPARISON:  [facility] CT from earlier the same day. FINDINGS: Brain: Study is severely limited as the patient was unable to tolerate the exam due to severe claustrophobia. Ulna knee axial diffusion-weighted sequence was performed. Diffusion-weighted imaging demonstrates no  evidence for acute or subacute ischemia. Gray-white matter differentiation grossly maintained. No obvious mass lesion. No midline shift or mass effect. No hydrocephalus. No appreciable extra-axial fluid collection. Vascular: Not evaluated on this limited exam. Skull and upper cervical spine: No appreciable scalp edema or other abnormality. Sinuses/Orbits: Globes orbital soft tissues as well as the paranasal sinuses not well evaluated on this limited exam. Other: None. IMPRESSION: 1. Limited study with only axial diffusion-weighted sequence performed. 2. No acute intracranial infarct. No other definite intracranial abnormality identified on this limited exam. Electronically Signed   By: Rise Mu M.D.   On: 04/04/2017 20:23    Assessment: 36 year old male with confusion 1. Exam nonlateralizing. Speech fluent  with no findings to suggest an aphasia. Patchy sensory changes noted.  2. CT head negative. Limited MRI brain shows no acute infarction.  3. Mild hyperammonemia and elevated LFTs. Suggestive of hepatic encephalopathy.  4. Possible OSA  Recommendations: 1. Toxic/metabolic work up to include structural imaging of liver 2. EEG.  3. Will need outpatient sleep study    Electronically signed: Dr. Caryl PinaEric Sharissa Brierley 04/04/2017, 10:48 PM

## 2017-04-04 NOTE — ED Provider Notes (Signed)
[facility] seen for ha/dizziness and altered mental status by Dr. Particia NearingHaviland.  Needs MRI brain, however [facility] and was sent here for MRI.  Physical Exam  BP 123/72 (BP Location: Right Arm)   Pulse 76   Temp 98 F (36.7 C) (Oral)   Resp 18   Ht 6\' 1"  (1.854 m)   Wt (!) 154.2 kg (340 lb)   SpO2 98%   BMI 44.86 kg/m   Physical Exam Physical Exam  Nursing note and vitals reviewed. Constitutional: He appears well-developed and well-nourished. No distress.  HENT:  Head: Normocephalic and atraumatic.  Eyes: Conjunctivae normal are normal. No scleral icterus.  Neck: Normal range of motion. Neck supple.  Cardiovascular: Normal rate, regular rhythm and normal heart sounds.   Pulmonary/Chest: Effort normal and breath sounds normal. No respiratory distress.  Abdominal: Soft. There is no tenderness.  Musculoskeletal: He exhibits no edema.  Neurological: He is alert.  +ataxia, no nystagmus. States it's the end of February. Unable to recall the date even with prompting. Skin: Skin is warm and dry. He is not diaphoretic.  Psychiatric: His behavior is normal.    ED Course/Procedures     Procedures  MDM  Patient MRI negative. Will admit for AMS.        Arthor CaptainHarris, Fusae Florio, PA-C 04/04/17 2303    Nira Connardama, Pedro Eduardo, MD 04/05/17 [phone]

## 2017-04-05 ENCOUNTER — Observation Stay (HOSPITAL_COMMUNITY): Payer: 59

## 2017-04-05 ENCOUNTER — Encounter (HOSPITAL_COMMUNITY): Payer: Self-pay | Admitting: General Practice

## 2017-04-05 DIAGNOSIS — G0490 Encephalitis and encephalomyelitis, unspecified: Secondary | ICD-10-CM | POA: Diagnosis not present

## 2017-04-05 DIAGNOSIS — R4182 Altered mental status, unspecified: Secondary | ICD-10-CM

## 2017-04-05 DIAGNOSIS — R40241 Glasgow coma scale score 13-15, unspecified time: Secondary | ICD-10-CM

## 2017-04-05 DIAGNOSIS — R270 Ataxia, unspecified: Secondary | ICD-10-CM | POA: Insufficient documentation

## 2017-04-05 LAB — COMPREHENSIVE METABOLIC PANEL
ALT: 134 U/L — ABNORMAL HIGH (ref 17–63)
ANION GAP: 10 (ref 5–15)
AST: 94 U/L — AB (ref 15–41)
Albumin: 3.3 g/dL — ABNORMAL LOW (ref 3.5–5.0)
Alkaline Phosphatase: 70 U/L (ref 38–126)
BILIRUBIN TOTAL: 0.7 mg/dL (ref 0.3–1.2)
BUN: 9 mg/dL (ref 6–20)
CHLORIDE: 105 mmol/L (ref 101–111)
CO2: 24 mmol/L (ref 22–32)
Calcium: 8.9 mg/dL (ref 8.9–10.3)
Creatinine, Ser: 0.82 mg/dL (ref 0.61–1.24)
Glucose, Bld: 95 mg/dL (ref 65–99)
POTASSIUM: 3.8 mmol/L (ref 3.5–5.1)
Sodium: 139 mmol/L (ref 135–145)
TOTAL PROTEIN: 6.8 g/dL (ref 6.5–8.1)

## 2017-04-05 LAB — CBC
HEMATOCRIT: 42.2 % (ref 39.0–52.0)
HEMOGLOBIN: 14.5 g/dL (ref 13.0–17.0)
MCH: 29.3 pg (ref 26.0–34.0)
MCHC: 34.4 g/dL (ref 30.0–36.0)
MCV: 85.3 fL (ref 78.0–100.0)
Platelets: 154 10*3/uL (ref 150–400)
RBC: 4.95 MIL/uL (ref 4.22–5.81)
RDW: 14.4 % (ref 11.5–15.5)
WBC: 8.7 10*3/uL (ref 4.0–10.5)

## 2017-04-05 LAB — HEMOGLOBIN A1C
Hgb A1c MFr Bld: 5.5 % (ref 4.8–5.6)
MEAN PLASMA GLUCOSE: 111.15 mg/dL

## 2017-04-05 MED ORDER — NICOTINE 21 MG/24HR TD PT24
21.0000 mg | MEDICATED_PATCH | Freq: Every day | TRANSDERMAL | Status: DC
  Filled 2017-04-05: qty 1

## 2017-04-05 MED ORDER — SODIUM CHLORIDE 0.9% FLUSH
3.0000 mL | Freq: Two times a day (BID) | INTRAVENOUS | Status: DC
  Administered 2017-04-05 – 2017-04-06 (×4): 3 mL via INTRAVENOUS

## 2017-04-05 MED ORDER — ALPRAZOLAM 0.5 MG PO TABS
1.0000 mg | ORAL_TABLET | Freq: Three times a day (TID) | ORAL | Status: DC | PRN
  Administered 2017-04-05 – 2017-04-07 (×2): 1 mg via ORAL
  Filled 2017-04-05 (×2): qty 2

## 2017-04-05 MED ORDER — LACTULOSE 10 GM/15ML PO SOLN
10.0000 g | Freq: Once | ORAL | Status: AC
  Administered 2017-04-05: 10 g via ORAL
  Filled 2017-04-05: qty 15

## 2017-04-05 MED ORDER — MAGNESIUM SULFATE 50 % IJ SOLN
2.0000 g | Freq: Once | INTRAMUSCULAR | Status: DC
  Filled 2017-04-05: qty 4

## 2017-04-05 MED ORDER — HALOPERIDOL LACTATE 5 MG/ML IJ SOLN
2.0000 mg | Freq: Once | INTRAMUSCULAR | Status: AC
  Administered 2017-04-05: 2 mg via INTRAVENOUS
  Filled 2017-04-05: qty 1

## 2017-04-05 MED ORDER — BISACODYL 5 MG PO TBEC
10.0000 mg | DELAYED_RELEASE_TABLET | Freq: Once | ORAL | Status: AC
  Administered 2017-04-05: 10 mg via ORAL
  Filled 2017-04-05: qty 2

## 2017-04-05 MED ORDER — SODIUM CHLORIDE 0.9 % IV SOLN: 250.0000 mL | INTRAVENOUS | Status: DC | PRN

## 2017-04-05 MED ORDER — ENOXAPARIN SODIUM 80 MG/0.8ML ~~LOC~~ SOLN
75.0000 mg | SUBCUTANEOUS | Status: DC
  Filled 2017-04-05 (×2): qty 0.8

## 2017-04-05 MED ORDER — MAGNESIUM SULFATE IN D5W 1-5 GM/100ML-% IV SOLN
1.0000 g | Freq: Once | INTRAVENOUS | Status: AC
  Administered 2017-04-05: 1 g via INTRAVENOUS
  Filled 2017-04-05: qty 100

## 2017-04-05 MED ORDER — ALPRAZOLAM 0.5 MG PO TABS
1.0000 mg | ORAL_TABLET | Freq: Once | ORAL | Status: AC
  Administered 2017-04-06: 1 mg via ORAL
  Filled 2017-04-05: qty 2

## 2017-04-05 MED ORDER — ENOXAPARIN SODIUM 80 MG/0.8ML ~~LOC~~ SOLN
80.0000 mg | SUBCUTANEOUS | Status: DC
  Administered 2017-04-06 – 2017-04-07 (×2): 80 mg via SUBCUTANEOUS
  Filled 2017-04-05 (×3): qty 0.8

## 2017-04-05 MED ORDER — SODIUM CHLORIDE 0.9% FLUSH: 3.0000 mL | INTRAVENOUS | Status: DC | PRN

## 2017-04-05 NOTE — Progress Notes (Signed)
Patient admitted to room 315 from emergency dept. [facility] was at [facility]. Patient has family with him. SR on tele monitor. Vital signs stable. Patient alert and denies pain.

## 2017-04-05 NOTE — ED Notes (Signed)
Pt walking around room and nurses station, stating he has to shower, get dressed and make a business phone call. Redirected patient to room, attempt to reorient patient to situation and need for rest. Pt presenting increasingly confused and agitated. MD paged.

## 2017-04-05 NOTE — ED Notes (Signed)
EEG at bedside.

## 2017-04-05 NOTE — Progress Notes (Signed)
Family member came to nsg station and asked this Clinical research associatewriter for something to be done because pt. Is becoming belligerent and aggressive. Pt. Wanting to leave per family member. Dr. Susie CassetteAbrol paged [facility]. Updated Britta MccreedyBarbara on family members concerns.

## 2017-04-05 NOTE — ED Notes (Signed)
Pt to nurses station stating he wants to go home. States he is bored. Reassured pt that physician would round today, advised pt I would bring medications to room and breakfast is on the way.

## 2017-04-05 NOTE — Procedures (Signed)
  ELECTROENCEPHALOGRAM REPORT  Date of Study: 04/05/17  Patient's Name: Clayton Jensen Cardarelli MRN: [id number] Date of Birth: 08/20/1980  Referring Provider: Caryl PinaEric Lindzen, MD  Clinical History: Clayton Jensen Petree is a 37 y.o.  male [facility] for evaluation of altered mental status, or behavior. Patient has been disoriented to time and place. Answering questions inappropriately. 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 Was confused on Sunday night, which worsened on Monday morning when his wife noted him to have put on 6 shirts total while not wearing any bottom clothing and with one shoe on.   Note mild hyperammonemia and question of hepatic encephalopathy.   Medications: Scheduled Meds: . [START ON 04/06/2017] enoxaparin (LOVENOX) injection  80 mg Subcutaneous Q24H  . sodium chloride flush  3 mL Intravenous Q12H   Continuous Infusions: . sodium chloride Stopped (04/04/17 1219)  . sodium chloride     PRN Meds:.sodium chloride, sodium chloride flush            Technical Summary: This is a standard 16 channel EEG recording performed according to the international 10-20 electrode system.  AP bipolar, transverse bipolar, and referential montages were obtained, and digitally reformatted as necessary.  Duration of tracing: 22:18  Description: Pt is noted to be intermittently drowsy/disoriented during the tracing.  Background is of increased amplitude (10-20 uV), mildly disorganized, with frequent movement/muscle artifact.  There is predominately a 6-7Hz  theta rhythm that is posterior predominant, but occasional 8Hz  alpha is noted.  There is no well defined drowsiness or stage 2 sleep, although pt noted to be snoring at times.  A single leg jerk is identified, but no epileptic changes were noted.  Neither HV or photic were performed.  EKG was SR at 60 bpm.  No definite  triphasic waves or epileptiform changes noted.  Impression: This is an abnormal EEG due to mild background slowing and disorganization seen throughout the tracing.  This is a non-specific finding that can be seen with toxic, metabolic, diffuse, or multifocal structural processes.  No definite triphasic waves or epileptiform changes were noted.   A single EEG without epileptiform changes does not exclude the diagnosis of epilepsy. Clinical correlation advised.   Beryle Beamsobert Farhana Fellows, M.D. Neurology Cell [phone]

## 2017-04-05 NOTE — ED Notes (Signed)
Pt ambulate to bathroom 

## 2017-04-05 NOTE — Progress Notes (Signed)
Reason for consult: AMS  Subjective: Seen patient this morning, was confused. Could not state name of hospital, got year incorrectly. Seemed agitated and confused.   ZOX:WRUEAVROS:Unable to obtain due to poor mental status  Examination  Vital signs in last 24 hours: Temp:  [97.7 F (36.5 C)-98.2 F (36.8 C)] 98.2 F (36.8 C) (01/01 1818) Pulse Rate:  [61-83] 61 (01/01 1818) Resp:  [18-32] 18 (01/01 1818) BP: (103-160)/(61-90) 160/90 (01/01 1818) SpO2:  [91 %-100 %] 97 % (01/01 1818) Weight:  [163.9 kg (361 lb 5.3 oz)] 163.9 kg (361 lb 5.3 oz) (01/01 1522)  General: Not in distress, cooperative CVS: pulse-normal rate and rhythm RS: breathing comfortably Extremities: normal   Neuro: MS: Alert confused, agitated,  follows commands CN: pupils equal and reactive,  EOMI, face symmetric, tongue midline, normal sensation over face, Motor: 5/5 strength in all 4 extremities Reflexes: 2+ bilaterally over patella, biceps, plantars: flexor Coordination: normal Gait: normal  Basic Metabolic Panel: Recent Labs  Lab 04/04/17 0759 04/04/17 2112 04/05/17 0411  NA 139  --  139  K 3.7  --  3.8  CL 104  --  105  CO2 25  --  24  GLUCOSE 107*  --  95  BUN 11  --  9  CREATININE 0.77  --  0.82  CALCIUM 8.9  --  8.9  MG  --  1.6*  --     CBC: Recent Labs  Lab 04/04/17 0759 04/05/17 0411  WBC 9.2 8.7  NEUTROABS 5.6  --   HGB 15.0 14.5  HCT 44.0 42.2  MCV 85.1 85.3  PLT 160 154     Coagulation Studies: No results for input(s): LABPROT, INR in the last 72 hours.  Imaging Reviewed:     ASSESSMENT AND PLAN  37 year old male with PMH obesity, insomnia presents with confusion.  Acute encephalopathy   MRI brain wo contrast negative for any abnormality Metabolic workup reveals mild hyperammonemia and elevated LFTs. Patient not any on sedating meds, benzo at home. Family denies h/o illicit drug abuse, supplements or excessive alcohol use.    Recommendations: 1. EEG shows-   diffuse encephalopathy, no epileptiform discharges 2. outpatient sleep study 3. Recommend LP to r/o viral encephalitis       Sushanth Aroor [facility] Pager Number [phone] For questions after 7pm please refer to AMION to reach the Neurologist on call

## 2017-04-05 NOTE — ED Notes (Signed)
Notified pharmacy for missing meds 

## 2017-04-05 NOTE — Progress Notes (Signed)
EEG completed, results pending. 

## 2017-04-05 NOTE — ED Notes (Signed)
Pt stating he feels very anxious about waiting for EEG. Offered pt haldol per MD order. Pt agreeable to try.

## 2017-04-05 NOTE — Progress Notes (Signed)
[facility] PROGRESS NOTE  Clayton Jensen:[id number] DOB: [birth year] DOA: 04/04/2017   PCP: Mikey Kirschner, MD  Brief history of present illness 37 year old male transferred from [facility] for evaluation of altered mental status, or behavior. Patient has been disoriented to time and place. Answering questions inappropriately. Transferred from [facility] for MRI and further evaluation   Assessment/Plan: Principal Problem:   Altered mental status    AMS B-12, folate, esr, , tsh, ammonia all within normal limits Urine drug screen negative UA negative Chest x-ray negative MRI of the brain within normal limits, no acute abnormality EEG Neurology consult appreciated-is suspected that the patient may have underlying obstructive sleep apnea, awaiting further neurology recommendations  Hypomagnesemia-repleted  Abnormal liver function Check acute hepatitis panel RUQ ultrasound-mild hepatomegaly, no biliary obstruction or space-occupying lesion  Hyperglycemia Check hga1c   DVT Prophylaxis  Lovenox - SCDs      Code Status:   Full code   Family Communication: Discussed in detail with the patient, all imaging results, lab results explained to the patient   Disposition Plan:   Pending further workup       Consultants:  Neurology  Procedures:  None  Antibiotics: Anti-infectives (From admission, onward)   None         HPI/Subjective: Patient intermittently confused and agitated, unable to remember the year, but able to recall the name of his children, their ages, his home address, his date of birth  Objective: Vitals:   04/04/17 2130 04/05/17 0100 04/05/17 0130 04/05/17 0645  BP:  133/70 116/68 123/61  Pulse: 71 72 76 74  Resp: (!) 30 (!) 32 (!) 26   Temp:      TempSrc:      SpO2: 96% 96% 96% 96%  Weight:      Height:        Intake/Output Summary (Last 24 hours) at 04/05/2017 0745 Last data filed at 04/04/2017  1219 Gross per 24 hour  Intake 1497.92 ml  Output -  Net 1497.92 ml    Exam:  Examination:  General exam: Appears calm and comfortable  Respiratory system: Clear to auscultation. Respiratory effort normal. Cardiovascular system: S1 & S2 heard, RRR. No JVD, murmurs, rubs, gallops or clicks. No pedal edema. Gastrointestinal system: Abdomen is nondistended, soft and nontender. No organomegaly or masses felt. Normal bowel sounds heard. Central nervous system: Alert and oriented to self. No focal neurological deficits. Extremities: Symmetric 5 x 5 power. Skin: No rashes, lesions or ulcers Psychiatry:   impaired judgment and insight     Data Reviewed: I have personally reviewed following labs and imaging studies  Micro Results No results found for this or any previous visit (from the past 240 hour(s)).  Radiology Reports Dg Chest 2 View  Result Date: 04/04/2017 CLINICAL DATA:  Altered mental status EXAM: CHEST  2 VIEW COMPARISON:  None. FINDINGS: Lungs are clear. Heart size and pulmonary vascularity are normal. No adenopathy. No bone lesions. IMPRESSION: No edema or consolidation. Electronically Signed   By: Lowella Grip III M.D.   On: 04/04/2017 09:08   Ct Head Wo Contrast  Result Date: 04/04/2017 CLINICAL DATA:  37 year old male with headache since waking yesterday. Nausea and malaise. Odd behavior reported by family. Bilateral lower extremity swelling. EXAM: CT HEAD WITHOUT CONTRAST TECHNIQUE: Contiguous axial images were obtained from the base of the skull through the vertex without intravenous contrast. COMPARISON:  Skull radiographs 03/28/2007. FINDINGS: Brain: Cerebral volume is within normal  limits. No midline shift, ventriculomegaly, mass effect, evidence of mass lesion, intracranial hemorrhage or evidence of cortically based acute infarction. Gray-white matter differentiation is within normal limits throughout the brain. Vascular: No suspicious intracranial vascular  hyperdensity. Skull: No osseous abnormality identified. Sinuses/Orbits: Only minor paranasal sinus mucosal thickening. Tympanic cavities and mastoids are clear. Other: Visualized orbits and scalp soft tissues are within normal limits. IMPRESSION: Negative head CT.  Normal noncontrast CT appearance of the brain. Electronically Signed   By: Genevie Ann M.D.   On: 04/04/2017 08:40   Mr Brain Wo Contrast  Result Date: 04/04/2017 CLINICAL DATA:  Initial evaluation for acute altered mental status. EXAM: MRI HEAD WITHOUT CONTRAST TECHNIQUE: Multiplanar, multiecho pulse sequences of the brain and surrounding structures were obtained without intravenous contrast. COMPARISON:  Priors CT from earlier the same day. FINDINGS: Brain: Study is severely limited as the patient was unable to tolerate the exam due to severe claustrophobia. Ulna knee axial diffusion-weighted sequence was performed. Diffusion-weighted imaging demonstrates no evidence for acute or subacute ischemia. Gray-white matter differentiation grossly maintained. No obvious mass lesion. No midline shift or mass effect. No hydrocephalus. No appreciable extra-axial fluid collection. Vascular: Not evaluated on this limited exam. Skull and upper cervical spine: No appreciable scalp edema or other abnormality. Sinuses/Orbits: Globes orbital soft tissues as well as the paranasal sinuses not well evaluated on this limited exam. Other: None. IMPRESSION: 1. Limited study with only axial diffusion-weighted sequence performed. 2. No acute intracranial infarct. No other definite intracranial abnormality identified on this limited exam. Electronically Signed   By: Jeannine Boga M.D.   On: 04/04/2017 20:23   US Abdomen Limited Ruq  Result Date: 04/04/2017 CLINICAL DATA:  Abdominal pain x2 days. Abnormal liver function tests. EXAM: ULTRASOUND ABDOMEN LIMITED RIGHT UPPER QUADRANT COMPARISON:  CT 10/17/2014 FINDINGS: Gallbladder: No gallstones or wall thickening  visualized. No sonographic Murphy sign noted by sonographer. Common bile duct: Diameter: Normal at 5.1 mm.  No choledocholithiasis. Liver: No focal lesion identified. Mild hepatomegaly with sagittal span measuring 18.7 cm. Within normal limits in parenchymal echogenicity. Portal vein is patent on color Doppler imaging with normal direction of blood flow towards the liver. IMPRESSION: Mild hepatomegaly. No biliary obstruction or space-occupying mass of the liver. Electronically Signed   By: Ashley Royalty M.D.   On: 04/04/2017 23:01     CBC Recent Labs  Lab 04/04/17 0759 04/05/17 0411  WBC 9.2 8.7  HGB 15.0 14.5  HCT 44.0 42.2  PLT 160 154  MCV 85.1 85.3  MCH 29.0 29.3  MCHC 34.1 34.4  RDW 14.1 14.4  LYMPHSABS 2.8  --   MONOABS 0.6  --   EOSABS 0.1  --   BASOSABS 0.0  --     Chemistries  Recent Labs  Lab 04/04/17 0759 04/04/17 2112 04/05/17 0411  NA 139  --  139  K 3.7  --  3.8  CL 104  --  105  CO2 25  --  24  GLUCOSE 107*  --  95  BUN 11  --  9  CREATININE 0.77  --  0.82  CALCIUM 8.9  --  8.9  MG  --  1.6*  --   AST 91*  --  94*  ALT 133*  --  134*  ALKPHOS 70  --  70  BILITOT 0.9  --  0.7   ------------------------------------------------------------------------------------------------------------------ estimated creatinine clearance is 189.3 mL/min (by C-G formula based on SCr of 0.82 mg/dL). ------------------------------------------------------------------------------------------------------------------ No results for input(s): HGBA1C  in the last 72 hours. ------------------------------------------------------------------------------------------------------------------ No results for input(s): CHOL, HDL, LDLCALC, TRIG, CHOLHDL, LDLDIRECT in the last 72 hours. ------------------------------------------------------------------------------------------------------------------ Recent Labs    04/04/17 2112  TSH 3.859    ------------------------------------------------------------------------------------------------------------------ Recent Labs    04/04/17 2112  VITAMINB12 646    Coagulation profile No results for input(s): INR, PROTIME in the last 168 hours.  No results for input(s): DDIMER in the last 72 hours.  Cardiac Enzymes No results for input(s): CKMB, TROPONINI, MYOGLOBIN in the last 168 hours.  Invalid input(s): CK ------------------------------------------------------------------------------------------------------------------ Invalid input(s): POCBNP   CBG: Recent Labs  Lab 04/04/17 0807  GLUCAP 102*       Studies: Dg Chest 2 View  Result Date: 04/04/2017 CLINICAL DATA:  Altered mental status EXAM: CHEST  2 VIEW COMPARISON:  None. FINDINGS: Lungs are clear. Heart size and pulmonary vascularity are normal. No adenopathy. No bone lesions. IMPRESSION: No edema or consolidation. Electronically Signed   By: Lowella Grip III M.D.   On: 04/04/2017 09:08   Ct Head Wo Contrast  Result Date: 04/04/2017 CLINICAL DATA:  37 year old male with headache since waking yesterday. Nausea and malaise. Odd behavior reported by family. Bilateral lower extremity swelling. EXAM: CT HEAD WITHOUT CONTRAST TECHNIQUE: Contiguous axial images were obtained from the base of the skull through the vertex without intravenous contrast. COMPARISON:  Skull radiographs 03/28/2007. FINDINGS: Brain: Cerebral volume is within normal limits. No midline shift, ventriculomegaly, mass effect, evidence of mass lesion, intracranial hemorrhage or evidence of cortically based acute infarction. Gray-white matter differentiation is within normal limits throughout the brain. Vascular: No suspicious intracranial vascular hyperdensity. Skull: No osseous abnormality identified. Sinuses/Orbits: Only minor paranasal sinus mucosal thickening. Tympanic cavities and mastoids are clear. Other: Visualized orbits and scalp soft  tissues are within normal limits. IMPRESSION: Negative head CT.  Normal noncontrast CT appearance of the brain. Electronically Signed   By: Genevie Ann M.D.   On: 04/04/2017 08:40   Mr Brain Wo Contrast  Result Date: 04/04/2017 CLINICAL DATA:  Initial evaluation for acute altered mental status. EXAM: MRI HEAD WITHOUT CONTRAST TECHNIQUE: Multiplanar, multiecho pulse sequences of the brain and surrounding structures were obtained without intravenous contrast. COMPARISON:  Priors CT from earlier the same day. FINDINGS: Brain: Study is severely limited as the patient was unable to tolerate the exam due to severe claustrophobia. Ulna knee axial diffusion-weighted sequence was performed. Diffusion-weighted imaging demonstrates no evidence for acute or subacute ischemia. Gray-white matter differentiation grossly maintained. No obvious mass lesion. No midline shift or mass effect. No hydrocephalus. No appreciable extra-axial fluid collection. Vascular: Not evaluated on this limited exam. Skull and upper cervical spine: No appreciable scalp edema or other abnormality. Sinuses/Orbits: Globes orbital soft tissues as well as the paranasal sinuses not well evaluated on this limited exam. Other: None. IMPRESSION: 1. Limited study with only axial diffusion-weighted sequence performed. 2. No acute intracranial infarct. No other definite intracranial abnormality identified on this limited exam. Electronically Signed   By: Jeannine Boga M.D.   On: 04/04/2017 20:23   US Abdomen Limited Ruq  Result Date: 04/04/2017 CLINICAL DATA:  Abdominal pain x2 days. Abnormal liver function tests. EXAM: ULTRASOUND ABDOMEN LIMITED RIGHT UPPER QUADRANT COMPARISON:  CT 10/17/2014 FINDINGS: Gallbladder: No gallstones or wall thickening visualized. No sonographic Murphy sign noted by sonographer. Common bile duct: Diameter: Normal at 5.1 mm.  No choledocholithiasis. Liver: No focal lesion identified. Mild hepatomegaly with sagittal span  measuring 18.7 cm. Within normal limits in parenchymal echogenicity. Portal vein is patent  on color Doppler imaging with normal direction of blood flow towards the liver. IMPRESSION: Mild hepatomegaly. No biliary obstruction or space-occupying mass of the liver. Electronically Signed   By: Ashley Royalty M.D.   On: 04/04/2017 23:01      No results found for: HGBA1C Lab Results  Component Value Date   CREATININE 0.82 04/05/2017       Scheduled Meds: . enoxaparin (LOVENOX) injection  75 mg Subcutaneous Q24H  . magnesium sulfate  2 g Intravenous Once  . sodium chloride flush  3 mL Intravenous Q12H   Continuous Infusions: . sodium chloride Stopped (04/04/17 1219)  . sodium chloride       LOS: 0 days    Time spent: >30 MINS    Reyne Dumas  [facility] Pager [phone]. If 7PM-7AM, please contact night-coverage at [email or web address], password [facility] 04/05/2017, 7:45 AM  LOS: 0 days

## 2017-04-06 ENCOUNTER — Observation Stay (HOSPITAL_COMMUNITY): Payer: 59

## 2017-04-06 DIAGNOSIS — G250 Essential tremor: Secondary | ICD-10-CM | POA: Diagnosis present

## 2017-04-06 DIAGNOSIS — G0490 Encephalitis and encephalomyelitis, unspecified: Secondary | ICD-10-CM | POA: Diagnosis not present

## 2017-04-06 DIAGNOSIS — R40241 Glasgow coma scale score 13-15, unspecified time: Secondary | ICD-10-CM | POA: Diagnosis not present

## 2017-04-06 DIAGNOSIS — R160 Hepatomegaly, not elsewhere classified: Secondary | ICD-10-CM | POA: Diagnosis present

## 2017-04-06 DIAGNOSIS — R7401 Elevation of levels of liver transaminase levels: Secondary | ICD-10-CM

## 2017-04-06 DIAGNOSIS — F064 Anxiety disorder due to known physiological condition: Secondary | ICD-10-CM | POA: Diagnosis present

## 2017-04-06 DIAGNOSIS — Z87442 Personal history of urinary calculi: Secondary | ICD-10-CM | POA: Diagnosis not present

## 2017-04-06 DIAGNOSIS — Z825 Family history of asthma and other chronic lower respiratory diseases: Secondary | ICD-10-CM | POA: Diagnosis not present

## 2017-04-06 DIAGNOSIS — F17210 Nicotine dependence, cigarettes, uncomplicated: Secondary | ICD-10-CM | POA: Diagnosis present

## 2017-04-06 DIAGNOSIS — R2681 Unsteadiness on feet: Secondary | ICD-10-CM | POA: Diagnosis present

## 2017-04-06 DIAGNOSIS — R945 Abnormal results of liver function studies: Secondary | ICD-10-CM

## 2017-04-06 DIAGNOSIS — R740 Nonspecific elevation of levels of transaminase and lactic acid dehydrogenase [LDH]: Secondary | ICD-10-CM

## 2017-04-06 DIAGNOSIS — R9401 Abnormal electroencephalogram [EEG]: Secondary | ICD-10-CM | POA: Diagnosis present

## 2017-04-06 DIAGNOSIS — G9340 Encephalopathy, unspecified: Secondary | ICD-10-CM | POA: Diagnosis not present

## 2017-04-06 DIAGNOSIS — Z8661 Personal history of infections of the central nervous system: Secondary | ICD-10-CM | POA: Diagnosis present

## 2017-04-06 DIAGNOSIS — K7689 Other specified diseases of liver: Secondary | ICD-10-CM

## 2017-04-06 DIAGNOSIS — A86 Unspecified viral encephalitis: Secondary | ICD-10-CM | POA: Diagnosis present

## 2017-04-06 DIAGNOSIS — Z6841 Body Mass Index (BMI) 40.0 and over, adult: Secondary | ICD-10-CM | POA: Diagnosis not present

## 2017-04-06 DIAGNOSIS — R270 Ataxia, unspecified: Secondary | ICD-10-CM | POA: Diagnosis present

## 2017-04-06 DIAGNOSIS — G4733 Obstructive sleep apnea (adult) (pediatric): Secondary | ICD-10-CM | POA: Diagnosis present

## 2017-04-06 DIAGNOSIS — R4182 Altered mental status, unspecified: Secondary | ICD-10-CM | POA: Diagnosis not present

## 2017-04-06 DIAGNOSIS — R739 Hyperglycemia, unspecified: Secondary | ICD-10-CM | POA: Diagnosis present

## 2017-04-06 DIAGNOSIS — Z833 Family history of diabetes mellitus: Secondary | ICD-10-CM | POA: Diagnosis not present

## 2017-04-06 LAB — COMPREHENSIVE METABOLIC PANEL
ALBUMIN: 3.3 g/dL — AB (ref 3.5–5.0)
ALK PHOS: 64 U/L (ref 38–126)
ALT: 135 U/L — ABNORMAL HIGH (ref 17–63)
ANION GAP: 9 (ref 5–15)
AST: 106 U/L — AB (ref 15–41)
BUN: 10 mg/dL (ref 6–20)
CO2: 20 mmol/L — AB (ref 22–32)
Calcium: 8.6 mg/dL — ABNORMAL LOW (ref 8.9–10.3)
Chloride: 108 mmol/L (ref 101–111)
Creatinine, Ser: 0.84 mg/dL (ref 0.61–1.24)
GFR calc Af Amer: >60 mL/min (ref >60)
GFR calc non Af Amer: >60 mL/min (ref >60)
GLUCOSE: 97 mg/dL (ref 65–99)
POTASSIUM: 4.7 mmol/L (ref 3.5–5.1)
SODIUM: 137 mmol/L (ref 135–145)
Total Bilirubin: 1.3 mg/dL — ABNORMAL HIGH (ref 0.3–1.2)
Total Protein: 6.3 g/dL — ABNORMAL LOW (ref 6.5–8.1)

## 2017-04-06 LAB — ANTINUCLEAR ANTIBODIES, IFA: ANA Ab, IFA: NEGATIVE

## 2017-04-06 LAB — CSF CELL COUNT WITH DIFFERENTIAL
Eosinophils, CSF: 1 % (ref 0–1)
Lymphs, CSF: 91 % — ABNORMAL HIGH (ref 40–80)
Monocyte-Macrophage-Spinal Fluid: 8 % — ABNORMAL LOW (ref 15–45)
RBC COUNT CSF: 2 /mm3 — AB
TUBE #: 3
WBC, CSF: 510 /mm3 (ref 0–5)

## 2017-04-06 LAB — CK: CK TOTAL: 114 U/L (ref 49–397)

## 2017-04-06 LAB — PROTEIN, CSF: Total  Protein, CSF: 250 mg/dL — ABNORMAL HIGH (ref 15–45)

## 2017-04-06 LAB — HEPATITIS PANEL, ACUTE
HEP A IGM: NEGATIVE
HEP B C IGM: NEGATIVE
HEP B S AG: NEGATIVE

## 2017-04-06 LAB — HIV ANTIBODY (ROUTINE TESTING W REFLEX)
HIV SCREEN 4TH GENERATION: NONREACTIVE
HIV Screen 4th Generation wRfx: NONREACTIVE

## 2017-04-06 LAB — GLUCOSE, CSF: Glucose, CSF: 56 mg/dL (ref 40–70)

## 2017-04-06 LAB — AMMONIA: Ammonia: 45 umol/L — ABNORMAL HIGH (ref 9–35)

## 2017-04-06 MED ORDER — GADOBENATE DIMEGLUMINE 529 MG/ML IV SOLN
20.0000 mL | Freq: Once | INTRAVENOUS | Status: AC
  Administered 2017-04-06: 20 mL via INTRAVENOUS

## 2017-04-06 MED ORDER — LIDOCAINE HCL (PF) 1 % IJ SOLN
5.0000 mL | Freq: Once | INTRAMUSCULAR | Status: AC
  Administered 2017-04-06: 5 mL via INTRADERMAL

## 2017-04-06 MED ORDER — DEXTROSE 5 % IV SOLN
10.0000 mg/kg | Freq: Three times a day (TID) | INTRAVENOUS | Status: DC
  Administered 2017-04-06 – 2017-04-08 (×6): 1135 mg via INTRAVENOUS
  Filled 2017-04-06: qty 20
  Filled 2017-04-06: qty 10
  Filled 2017-04-06: qty 22.7
  Filled 2017-04-06: qty 10
  Filled 2017-04-06 (×3): qty 20

## 2017-04-06 NOTE — Progress Notes (Signed)
[facility] PROGRESS NOTE  Clayton Jensen DVV:[id number] DOB: [birth year] DOA: 04/04/2017   PCP: Mikey Kirschner, MD  Brief history of present illness 37 year old male transferred from [facility] for evaluation of altered mental status, or behavior. Patient has been disoriented to time and place. Answering questions inappropriately. Transferred from [facility] for MRI and further evaluation. MRI negative but LP suggestive of viral meningitis   Assessment/Plan: Principal Problem:   Altered mental status    AMS, likely secondary to viral meningitis B-12, folate, esr, , tsh, ammonia all within normal limits Urine drug screen negative UA negative Chest x-ray negative MRI of the brain within normal limits, no acute abnormality EEG -slowing with nonspecific encephalopathy Neurology consult appreciated-is suspected that the patient may have underlying obstructive sleep apnea, awaiting further neurology recommendations LP showed 510 white blood cells with 91% lymphocytes, suggestive of viral meningitis, patient started on acyclovir and infectious disease Dr. Megan Salon was consulted  Hypomagnesemia-repleted  Abnormal liver function Pending acute hepatitis panel RUQ ultrasound-mild hepatomegaly, no biliary obstruction or space-occupying lesion Check CK  Hyperglycemia Hemoglobin A1c 5.5  Agitation/anxiety Patient has been started on Xanax PRN    DVT Prophylaxis  Lovenox - SCDs      Code Status:   Full code   Family Communication: Discussed in detail with the patient and extended family in the room, all imaging results, lab results explained to the patient   Disposition Plan:   Pending further workup       Consultants:  Neurology  Procedures:  None  Antibiotics: Anti-infectives (From admission, onward)   Start     Dose/Rate Route Frequency Ordered Stop   04/06/17 1400  acyclovir (ZOVIRAX) 1,135 mg in dextrose 5 % 250 mL IVPB      10 mg/kg  113.5 kg (Adjusted) 272.7 mL/hr over 60 Minutes Intravenous Every 8 hours 04/06/17 1303           HPI/Subjective:  Patient is much more calm today, status post LP, family in the room, extensive discussion with me and neurology, trying to explain to them symptomatology and disease course of viral meningitis  Objective: Vitals:   04/05/17 2105 04/05/17 2120 04/06/17 0437 04/06/17 1017  BP: (!) 129/115 114/66 127/64 114/64  Pulse: (!) 104  75 72  Resp: _0 Temp: 98.2 F (36.8 C)  97.9 F (36.6 C) 97.6 F (36.4 C)  TempSrc: Oral  Oral Oral  SpO2: 96%  98% 95%  Weight:   (!) 164 kg (361 lb 8.9 oz)   Height:        Intake/Output Summary (Last 24 hours) at 04/06/2017 1304 Last data filed at 04/06/2017 1139 Gross per 24 hour  Intake 360 ml  Output 500 ml  Net -140 ml    Exam:  Examination:  General exam: Appears calm and comfortable  Respiratory system: Clear to auscultation. Respiratory effort normal. Cardiovascular system: S1 & S2 heard, RRR. No JVD, murmurs, rubs, gallops or clicks. No pedal edema. Gastrointestinal system: Abdomen is nondistended, soft and nontender. No organomegaly or masses felt. Normal bowel sounds heard. Central nervous system: Alert and oriented to self. No focal neurological deficits. Extremities: Symmetric 5 x 5 power. Skin: No rashes, lesions or ulcers Psychiatry:   impaired judgment and insight     Data Reviewed: I have personally reviewed following labs and imaging studies  Micro Results Recent Results (from the past 240 hour(s))  CSF culture     Status:  None (Preliminary result)   Collection Time: 04/06/17  9:43 AM  Result Value Ref Range Status   Specimen Description CSF  Final   Special Requests NONE  Final   Gram Stain   Final    CYTOSPIN SMEAR MONONUCLEAR CELLS NO ORGANISMS SEEN    Culture PENDING  Incomplete   Report Status PENDING  Incomplete    Radiology Reports Dg Chest 2 View  Result Date:  04/04/2017 CLINICAL DATA:  Altered mental status EXAM: CHEST  2 VIEW COMPARISON:  None. FINDINGS: Lungs are clear. Heart size and pulmonary vascularity are normal. No adenopathy. No bone lesions. IMPRESSION: No edema or consolidation. Electronically Signed   By: Lowella Grip III M.D.   On: 04/04/2017 09:08   Ct Head Wo Contrast  Result Date: 04/04/2017 CLINICAL DATA:  37 year old male with headache since waking yesterday. Nausea and malaise. Odd behavior reported by family. Bilateral lower extremity swelling. EXAM: CT HEAD WITHOUT CONTRAST TECHNIQUE: Contiguous axial images were obtained from the base of the skull through the vertex without intravenous contrast. COMPARISON:  Skull radiographs 03/28/2007. FINDINGS: Brain: Cerebral volume is within normal limits. No midline shift, ventriculomegaly, mass effect, evidence of mass lesion, intracranial hemorrhage or evidence of cortically based acute infarction. Gray-white matter differentiation is within normal limits throughout the brain. Vascular: No suspicious intracranial vascular hyperdensity. Skull: No osseous abnormality identified. Sinuses/Orbits: Only minor paranasal sinus mucosal thickening. Tympanic cavities and mastoids are clear. Other: Visualized orbits and scalp soft tissues are within normal limits. IMPRESSION: Negative head CT.  Normal noncontrast CT appearance of the brain. Electronically Signed   By: Genevie Ann M.D.   On: 04/04/2017 08:40   Mr Brain Wo Contrast  Result Date: 04/04/2017 CLINICAL DATA:  Initial evaluation for acute altered mental status. EXAM: MRI HEAD WITHOUT CONTRAST TECHNIQUE: Multiplanar, multiecho pulse sequences of the brain and surrounding structures were obtained without intravenous contrast. COMPARISON:  [facility] CT from earlier the same day. FINDINGS: Brain: Study is severely limited as the patient was unable to tolerate the exam due to severe claustrophobia. Ulna knee axial diffusion-weighted sequence was performed.  Diffusion-weighted imaging demonstrates no evidence for acute or subacute ischemia. Gray-white matter differentiation grossly maintained. No obvious mass lesion. No midline shift or mass effect. No hydrocephalus. No appreciable extra-axial fluid collection. Vascular: Not evaluated on this limited exam. Skull and upper cervical spine: No appreciable scalp edema or other abnormality. Sinuses/Orbits: Globes orbital soft tissues as well as the paranasal sinuses not well evaluated on this limited exam. Other: None. IMPRESSION: 1. Limited study with only axial diffusion-weighted sequence performed. 2. No acute intracranial infarct. No other definite intracranial abnormality identified on this limited exam. Electronically Signed   By: Jeannine Boga M.D.   On: 04/04/2017 20:23   Mr Jeri Cos MW Contrast  Result Date: 04/06/2017 CLINICAL DATA:  Encephalopathy EXAM: MRI HEAD WITHOUT AND WITH CONTRAST TECHNIQUE: Multiplanar, multiecho pulse sequences of the brain and surrounding structures were obtained without and with intravenous contrast. CONTRAST:  33m MULTIHANCE GADOBENATE DIMEGLUMINE 529 MG/ML IV SOLN COMPARISON:  MRI and CT 04/04/2017 FINDINGS: Brain: No acute infarction, hemorrhage, hydrocephalus, extra-axial collection or mass lesion. Normal enhancement postcontrast infusion Vascular: Normal arterial flow voids. Skull and upper cervical spine: Negative Sinuses/Orbits: Mild mucosal edema paranasal sinuses.  Normal orbit. Other: None IMPRESSION: Negative MRI head with contrast. Electronically Signed   By: CFranchot GalloM.D.   On: 04/06/2017 08:26   Dg Fluoro Guided Needle Plc Aspiration/injection Loc  Result Date: 04/06/2017 CLINICAL DATA:  Mental status changes. EXAM: DIAGNOSTIC LUMBAR PUNCTURE UNDER FLUOROSCOPIC GUIDANCE FLUOROSCOPY TIME:  Fluoroscopy Time:  3 minutes and 12 seconds Radiation Exposure Index (if provided by the fluoroscopic device): Not applicable. Number of Acquired Spot Images: 1  PROCEDURE: Informed consent was obtained from the the patient's wife, via phone conversation, prior to the procedure, including potential complications of allergy, and pain. With the patient prone, the lower back was prepped with Betadine. 1% Lidocaine was used for local anesthesia. Lumbar puncture was performed at the L3/4 level using a 20 gauge needle with return of clear CSF with an opening pressure of 16 cm water. 10 ml of CSF were obtained for laboratory studies. The patient tolerated the procedure well and there were no apparent complications. IMPRESSION: Non complicated lumbar puncture, as detailed above. Electronically Signed   By: Abigail Miyamoto M.D.   On: 04/06/2017 09:57   US Abdomen Limited Ruq  Result Date: 04/04/2017 CLINICAL DATA:  Abdominal pain x2 days. Abnormal liver function tests. EXAM: ULTRASOUND ABDOMEN LIMITED RIGHT UPPER QUADRANT COMPARISON:  CT 10/17/2014 FINDINGS: Gallbladder: No gallstones or wall thickening visualized. No sonographic Murphy sign noted by sonographer. Common bile duct: Diameter: Normal at 5.1 mm.  No choledocholithiasis. Liver: No focal lesion identified. Mild hepatomegaly with sagittal span measuring 18.7 cm. Within normal limits in parenchymal echogenicity. Portal vein is patent on color Doppler imaging with normal direction of blood flow towards the liver. IMPRESSION: Mild hepatomegaly. No biliary obstruction or space-occupying mass of the liver. Electronically Signed   By: Ashley Royalty M.D.   On: 04/04/2017 23:01     CBC Recent Labs  Lab 04/04/17 0759 04/05/17 0411  WBC 9.2 8.7  HGB 15.0 14.5  HCT 44.0 42.2  PLT 160 154  MCV 85.1 85.3  MCH 29.0 29.3  MCHC 34.1 34.4  RDW 14.1 14.4  LYMPHSABS 2.8  --   MONOABS 0.6  --   EOSABS 0.1  --   BASOSABS 0.0  --     Chemistries  Recent Labs  Lab 04/04/17 0759 04/04/17 2112 04/05/17 0411 04/06/17 1029  NA 139  --  139 137  K 3.7  --  3.8 4.7  CL 104  --  105 108  CO2 25  --  24 20*  GLUCOSE 107*   --  95 97  BUN 11  --  9 10  CREATININE 0.77  --  0.82 0.84  CALCIUM 8.9  --  8.9 8.6*  MG  --  1.6*  --   --   AST 91*  --  94* 106*  ALT 133*  --  134* 135*  ALKPHOS 70  --  70 64  BILITOT 0.9  --  0.7 1.3*   ------------------------------------------------------------------------------------------------------------------ estimated creatinine clearance is 191.4 mL/min (by C-G formula based on SCr of 0.84 mg/dL). ------------------------------------------------------------------------------------------------------------------ Recent Labs    04/05/17 0411  HGBA1C 5.5   ------------------------------------------------------------------------------------------------------------------ No results for input(s): CHOL, HDL, LDLCALC, TRIG, CHOLHDL, LDLDIRECT in the last 72 hours. ------------------------------------------------------------------------------------------------------------------ Recent Labs    04/04/17 2112  TSH 3.859   ------------------------------------------------------------------------------------------------------------------ Recent Labs    04/04/17 2112  VITAMINB12 646    Coagulation profile No results for input(s): INR, PROTIME in the last 168 hours.  No results for input(s): DDIMER in the last 72 hours.  Cardiac Enzymes No results for input(s): CKMB, TROPONINI, MYOGLOBIN in the last 168 hours.  Invalid input(s): CK ------------------------------------------------------------------------------------------------------------------ Invalid input(s): POCBNP   CBG: Recent Labs  Lab 04/04/17 0807  GLUCAP 102*  Studies: Mr Brain 29 Contrast  Result Date: 04/04/2017 CLINICAL DATA:  Initial evaluation for acute altered mental status. EXAM: MRI HEAD WITHOUT CONTRAST TECHNIQUE: Multiplanar, multiecho pulse sequences of the brain and surrounding structures were obtained without intravenous contrast. COMPARISON:  [facility] CT from earlier the same day.  FINDINGS: Brain: Study is severely limited as the patient was unable to tolerate the exam due to severe claustrophobia. Ulna knee axial diffusion-weighted sequence was performed. Diffusion-weighted imaging demonstrates no evidence for acute or subacute ischemia. Gray-white matter differentiation grossly maintained. No obvious mass lesion. No midline shift or mass effect. No hydrocephalus. No appreciable extra-axial fluid collection. Vascular: Not evaluated on this limited exam. Skull and upper cervical spine: No appreciable scalp edema or other abnormality. Sinuses/Orbits: Globes orbital soft tissues as well as the paranasal sinuses not well evaluated on this limited exam. Other: None. IMPRESSION: 1. Limited study with only axial diffusion-weighted sequence performed. 2. No acute intracranial infarct. No other definite intracranial abnormality identified on this limited exam. Electronically Signed   By: Jeannine Boga M.D.   On: 04/04/2017 20:23   Mr Jeri Cos HM Contrast  Result Date: 04/06/2017 CLINICAL DATA:  Encephalopathy EXAM: MRI HEAD WITHOUT AND WITH CONTRAST TECHNIQUE: Multiplanar, multiecho pulse sequences of the brain and surrounding structures were obtained without and with intravenous contrast. CONTRAST:  12m MULTIHANCE GADOBENATE DIMEGLUMINE 529 MG/ML IV SOLN COMPARISON:  MRI and CT 04/04/2017 FINDINGS: Brain: No acute infarction, hemorrhage, hydrocephalus, extra-axial collection or mass lesion. Normal enhancement postcontrast infusion Vascular: Normal arterial flow voids. Skull and upper cervical spine: Negative Sinuses/Orbits: Mild mucosal edema paranasal sinuses.  Normal orbit. Other: None IMPRESSION: Negative MRI head with contrast. Electronically Signed   By: CFranchot GalloM.D.   On: 04/06/2017 08:26   Dg Fluoro Guided Needle Plc Aspiration/injection Loc  Result Date: 04/06/2017 CLINICAL DATA:  Mental status changes. EXAM: DIAGNOSTIC LUMBAR PUNCTURE UNDER FLUOROSCOPIC GUIDANCE  FLUOROSCOPY TIME:  Fluoroscopy Time:  3 minutes and 12 seconds Radiation Exposure Index (if provided by the fluoroscopic device): Not applicable. Number of Acquired Spot Images: 1 PROCEDURE: Informed consent was obtained from the the patient's wife, via phone conversation, prior to the procedure, including potential complications of allergy, and pain. With the patient prone, the lower back was prepped with Betadine. 1% Lidocaine was used for local anesthesia. Lumbar puncture was performed at the L3/4 level using a 20 gauge needle with return of clear CSF with an opening pressure of 16 cm water. 10 ml of CSF were obtained for laboratory studies. The patient tolerated the procedure well and there were no apparent complications. IMPRESSION: Non complicated lumbar puncture, as detailed above. Electronically Signed   By: KAbigail MiyamotoM.D.   On: 04/06/2017 09:57   [place]Abdomen Limited Ruq  Result Date: 04/04/2017 CLINICAL DATA:  Abdominal pain x2 days. Abnormal liver function tests. EXAM: ULTRASOUND ABDOMEN LIMITED RIGHT UPPER QUADRANT COMPARISON:  CT 10/17/2014 FINDINGS: Gallbladder: No gallstones or wall thickening visualized. No sonographic Murphy sign noted by sonographer. Common bile duct: Diameter: Normal at 5.1 mm.  No choledocholithiasis. Liver: No focal lesion identified. Mild hepatomegaly with sagittal span measuring 18.7 cm. Within normal limits in parenchymal echogenicity. Portal vein is patent on color Doppler imaging with normal direction of blood flow towards the liver. IMPRESSION: Mild hepatomegaly. No biliary obstruction or space-occupying mass of the liver. Electronically Signed   By: DAshley RoyaltyM.D.   On: 04/04/2017 23:01      Lab Results  Component Value Date   HGBA1C  5.5 04/05/2017   Lab Results  Component Value Date   CREATININE 0.84 04/06/2017       Scheduled Meds: . enoxaparin (LOVENOX) injection  80 mg Subcutaneous Q24H  . nicotine  21 mg Transdermal Daily  . sodium chloride  flush  3 mL Intravenous Q12H   Continuous Infusions: . sodium chloride 125 mL/hr at 04/06/17 1213  . sodium chloride    . acyclovir       LOS: 0 days    Time spent: >30 MINS    Reyne Dumas  [facility] Pager [phone]. If 7PM-7AM, please contact night-coverage at [email or web address], password [facility] 04/06/2017, 1:04 PM  LOS: 0 days

## 2017-04-06 NOTE — Progress Notes (Addendum)
Pharmacy Antibiotic Note  Clayton HarmanMichael H Jensen is a 37 y.o. male admitted on 04/04/2017 with suspected viral meningitis.  Pharmacy has been consulted for acyclovir dosing. LP with significantly high WBC, predominantly lymphocytes. HSV pcr ip. CrCl>100.  Plan: Acyclovir 1135mg  (~10mg /kg Adj BW) IV q8h Monitor clinical progress, c/s, renal function F/u HSV pcr, LOT   Height: 6\' 1"  (185.4 cm) Weight: (!) 361 lb 8.9 oz (164 kg) IBW/kg (Calculated) : 79.9  Temp (24hrs), Avg:97.9 F (36.6 C), Min:97.6 F (36.4 C), Max:98.2 F (36.8 C)  Recent Labs  Lab 04/04/17 0759 04/05/17 0411 04/06/17 1029  WBC 9.2 8.7  --   CREATININE 0.77 0.82 0.84    Estimated Creatinine Clearance: 191.4 mL/min (by C-G formula based on SCr of 0.84 mg/dL).    No Known Allergies  Babs BertinHaley Jahmari Esbenshade, PharmD, BCPS Clinical Pharmacist Clinical phone for 04/06/2017 until 3:30pm: [phone] If after 3:30pm, please call main pharmacy at: [phone] 04/06/2017 1:10 PM

## 2017-04-06 NOTE — Consult Note (Signed)
[facility]    Date of Admission:  04/04/2017     Total days of antibiotics 1  Acyclovir 04/06/17               Reason for Consult: abnormal LP in setting of altered mental status/confusion   Referring Provider: Dr. Susie CassetteAbrol    Assessment: 37 y.o. with confusion, altered mental status, lethargy and headache admitted to hospital. 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 Also found to have transaminitis with work up with normal work up aside from hepatomegaly on US. Unlikely this is tick/vector related considering this has suddenly occurred over winter months.   Plan: 1. Continue Acyclovir for presumptive HSV involvement. LP patterns not c/w bacterial cause.  2. Watch and wait for current tests - discussion with family and patient today that often these viral causes are not identified and are likely to resolve over time with supportive care. Hopefully we will have more definitive information once we get testing back and more importantly hopefully he will start to show some improvement clinically.  3. Regarding liver enzymes, I believe this is to a degree a chronic problem that may not be related to current process impacting his mental state. Would trend again in a few days.  4. HIV negative and does not appear to have risk factors that would point to acute infection. Could consider re-checking antibody in a few weeks or PCR now but again this seems unlikely. Also RPR still pending - will follow up this to ensure no syphilitic involvement.    HPI: Clayton Jensen is a 37 y.o. male admitted [facility] with Principal Problem:   Meningoencephalitis Active Problems:   Altered mental status   Transaminitis  History primarily obtained from his wife, step-mother and chart review due to patient's mental status. 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 Recently he has  been helping his wife with her entertainment/event planning business putting in long hours and with minimal sleep in addition to caring for their young 647 month old daughter and working his normal schedule. Symptoms of fatigue and headache started on Saturday 12/30 while they were working in [place], [place] at an event. He slept most of the day on Sunday only waking for a short few hours to which he attempted to eat a little and went back to bed. Presented to the ED acutely confused on 04/04/17 after his wife noticed changes to how he was walking, talking and actions (appearance as if he was 'drunk') after he was found by his wife attempting to put on 5-6 shirts in the bathroom without pants on. He had fluent speech at this time per his wife's account but had trouble 'putting his words together' and just 'didn't seem to know what he was trying to do.'  In the last 3 months Clayton Jensen reports only one new medication - triamterene-HCTZ that was prescribed to him for leg swelling and leg pain in May of this year. Struggled with cough/sinusitus and URI symptoms for 3 weeks in [place]. Last antibiotic was azithromycin that was prescribed in late October 2018. Travel history includes only to [place], [place] and [place] area along with his regular job in [place], [place]. Does have history of tick exposure without rash/fevers or acute symptoms at that time in the summer of 2018. No new pets - they have 3 dogs that are primarily indoor  only. He smokes cigarettes daily. No illicit drugs and only minimal alcohol per his wife's report. No rashes, lesions in mouth/genitals. No new sexual partners per his wife's account.   Hospital Course: Drug screen negative. Hepatitis panel negative. LP obtained today showing hazy CSF appearance, normal glucose @ 56, 510 WBCs 91% lymphs, elevated protein @ 250, 2 RBCs. LFTs are also elevated with AST 106 / ALT 135, TBili 1.3. Further CSF studies including enterovirus, VDRL, HSV PCRs  pending. 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 MRI of head negative x 2. RUQ ultrasound with mild hepatomegaly, normal echogenicity and patent portal vein.   Since being in the hospital his family reports no improvement in symptoms. He cannot accurately tell what year it is (reported 2001 recently) and does not recall the ages of his children; however there are certain things he can recall quickly per his wife's account. Frequently becomes agitated and requesting to leave. He is currently on bedrest s/p his LP procedure and unable to comfortably eat. No fevers or other specific/constitutional symptoms outside of the headaches and extreme fatigue/grogginess and confusion. Did have diarrhea this morning however this was attributed to stool softeners he received d/t constipation. Has been complaining of headaches off and on today.    . enoxaparin (LOVENOX) injection  80 mg Subcutaneous Q24H  . nicotine  21 mg Transdermal Daily  . sodium chloride flush  3 mL Intravenous Q12H   Review of Systems: Review of Systems  Unable to perform ROS: Mental status change    Past Medical History:  Diagnosis Date  . Chronic lower back pain    "fell off roof in ~ 2003; disc fused itself"  . History of kidney stones   . OSA (obstructive sleep apnea)    clinical diagnosis, not officially diagnosed per family    Social History   Tobacco Use  . Smoking status: Current Every Day Smoker    Packs/day: 0.75    Years: 23.00    Pack years: 17.25    Types: Cigarettes  . Smokeless tobacco: Never Used  Substance Use Topics  . Alcohol use: Yes    Frequency: Never    Comment: 04/05/2017 "might have a beer q 2 months"  . Drug use: No    Family History  Problem Relation Age of Onset  . COPD Mother   . Atrial fibrillation Mother   . Diabetes Mother   . Diabetes Father    No Known Allergies  OBJECTIVE: Blood pressure 115/63, pulse 65, temperature 97.6 F (36.4 C),  temperature source Oral, resp. rate 19, height 6\' 1"  (1.854 m), weight (!) 361 lb 8.9 oz (164 kg), SpO2 96 %.  Physical Exam  Constitutional:  Obese young male lying in bed. Frequently appears to fall asleep and stares up at ceiling.   HENT:  Head: Normocephalic.  Mouth/Throat: Oropharynx is clear and moist. No oropharyngeal exudate.  Eyes: Conjunctivae and EOM are normal. Pupils are equal, round, and reactive to light. No scleral icterus.  Neck: Normal range of motion. No muscular tenderness present. No neck rigidity. Normal range of motion present. No Brudzinski's sign noted.  Cardiovascular: Normal rate, regular rhythm and normal heart sounds.  No murmur heard. Pulmonary/Chest: Effort normal and breath sounds normal. No respiratory distress. He has no wheezes. He has no rales.  Abdominal: Soft. Bowel sounds are normal. He exhibits no distension and no fluid wave. There is no hepatomegaly. There is no tenderness.  Neurological: He has normal strength. He  is disoriented. He is not agitated. He displays no tremor and normal speech.  Lethargic. Difficult to perform sensation tests as he falls asleep.     Lab Results Lab Results  Component Value Date   WBC 8.7 04/05/2017   HGB 14.5 04/05/2017   HCT 42.2 04/05/2017   MCV 85.3 04/05/2017   PLT 154 04/05/2017    Lab Results  Component Value Date   CREATININE 0.84 04/06/2017   BUN 10 04/06/2017   NA 137 04/06/2017   K 4.7 04/06/2017   CL 108 04/06/2017   CO2 20 (L) 04/06/2017    Lab Results  Component Value Date   ALT 135 (H) 04/06/2017   AST 106 (H) 04/06/2017   ALKPHOS 64 04/06/2017   BILITOT 1.3 (H) 04/06/2017     Microbiology: CSF Cx 04/06/16 >> pending  HSV CSF PCR >> pending VDRL CSF PCR >> pending Enterovirus PCR >> pending Serum RPR >> pending   Recent Results (from the past 240 hour(s))  CSF culture     Status: None (Preliminary result)   Collection Time: 04/06/17  9:43 AM  Result Value Ref Range Status    Specimen Description CSF  Final   Special Requests NONE  Final   Gram Stain   Final    CYTOSPIN SMEAR MONONUCLEAR CELLS NO ORGANISMS SEEN    Culture PENDING  Incomplete   Report Status PENDING  Incomplete   Rexene Alberts, MSN, NP-C [facility] Cell: [phone] Pager: [phone]  04/06/2017 5:06 PM

## 2017-04-06 NOTE — Progress Notes (Signed)
CRITICAL VALUE ALERT  Critical Value:  LP WBC 510  Date & Time Notied:  04/06/16 1205  Provider Notified: Dr. Susie CassetteAbrol, PA Onalee Huaavid and Dr. Laurence SlateAroor.

## 2017-04-06 NOTE — Procedures (Signed)
CLCLINICAL DATA: [Mental status changes.]  EXAM:  DIAGNOSTIC LUMBAR PUNCTURE UNDER FLUOROSCOPIC GUIDANCE  FLUOROSCOPY TIME: Fluoroscopy Time:  [3 minutes and 12 seconds]  Radiation Exposure Index (if provided by the fluoroscopic device):  [Not applicable.  ]  Number of Acquired Spot Images: [1]  PROCEDURE:  Informed consent was obtained from the the patient's wife, via phone conversation, prior to the procedure, including potential complications of allergy, and pain.  With the patient prone, the lower back was prepped with Betadine.  1% Lidocaine was used for local anesthesia.  Lumbar puncture was performed at the [L3/4] level using a [20] gauge needle with return of [clear] CSF with an opening pressure of [16] cm water.  [10] ml of CSF were obtained for laboratory studies.  The patient tolerated the procedure well and there were no apparent complications.    IMPRESSION: [Non complicated lumbar puncture, as detailed above.]

## 2017-04-06 NOTE — Progress Notes (Signed)
Subjective: No significant change in exam.  Patient lethargic.  Patient will wake up when stimulated patient knows that he is in the hospital and follows commands.  Wife is at bedside and extremely distraught worried that her husband is not going to come back to normal.  Multiple questions were asked during this follow-up.  Exam: Vitals:   04/06/17 0437 04/06/17 1017  BP: 127/64 114/64  Pulse: 75 72  Resp: 18 18  Temp: 97.9 F (36.6 C) 97.6 F (36.4 C)  SpO2: 98% 95%    HEENT-  Normocephalic, no lesions, without obvious abnormality.  Normal external eye and conjunctiva.  Normal TM's bilaterally.  Normal auditory canals and external ears. Normal external nose, mucus membranes and septum.  Normal pharynx. Cardiovascular- S1, S2 normal, pulses palpable throughout   Lungs- chest clear, no wheezing, rales, normal symmetric air entry Abdomen- normal findings: bowel sounds normal Extremities- no edema Lymph-no adenopathy palpable    Neuro:  CN: Pupils are equal and round. They are symmetrically reactive from 3-->2 mm. EOMI without nystagmus. Facial sensation is intact to light touch. Face is symmetric at rest with normal strength and mobility. Hearing is intact to conversational voice. Palate elevates symmetrically and uvula is midline. Voice is normal in tone, pitch and quality. Bilateral SCM and trapezii are 5/5. Tongue is midline with normal bulk and mobility.  Motor:  Moving all extremities antigravity Sensation: Intact to light touch.  DTRs: 2+, symmetric  Toes downgoing bilaterally. No pathologic reflexes.  Coordination: Finger-to-nose and heel-to-shin are without dysmetria   Medications:  Scheduled: . enoxaparin (LOVENOX) injection  80 mg Subcutaneous Q24H  . nicotine  21 mg Transdermal Daily  . sodium chloride flush  3 mL Intravenous Q12H   Continuous: . sodium chloride 125 mL/hr at 04/05/17 2056  . sodium chloride     ZOX:WRUEAVPRN:sodium chloride, ALPRAZolam, sodium chloride  flush  Pertinent Labs/Diagnostics:   Ref. Range 04/06/2017 09:43  Appearance, CSF Latest Ref Range: CLEAR  HAZY (A)  Glucose, CSF Latest Ref Range: 40 - 70 mg/dL 56  RBC Count, CSF Latest Ref Range: 0 /cu mm 2 (H)  Lymphs, CSF Latest Ref Range: 40 - 80 % 91 (H)  Monocyte-Macrophage-Spinal Fluid Latest Ref Range: 15 - 45 % 8 (L)  Eosinophils, CSF Latest Ref Range: 0 - 1 % 1  Color, CSF Latest Ref Range: COLORLESS  COLORLESS  Supernatant Unknown NOT INDICATED  Total  Protein, CSF Latest Ref Range: 15 - 45 mg/dL 409250 (H)  Tube # Unknown 3  WBC, CSF Latest Ref Range: 0 - 5 /cu mm 510 [facility](HH)    Mr Brain Wo Contrast  Result Date: 04/04/2017 CLINICAL DATA:  Initial evaluation for acute altered mental status. EXAM: MRI HEAD WITHOUT CONTRAST TECHNIQUE: Multiplanar, multiecho pulse sequences of the brain and surrounding structures were obtained without intravenous contrast. COMPARISON:  [facility] CT from earlier the same day. FINDINGS: Brain: Study is severely limited as the patient was unable to tolerate the exam due to severe claustrophobia. Ulna knee axial diffusion-weighted sequence was performed. Diffusion-weighted imaging demonstrates no evidence for acute or subacute ischemia. Gray-white matter differentiation grossly maintained. No obvious mass lesion. No midline shift or mass effect. No hydrocephalus. No appreciable extra-axial fluid collection. Vascular: Not evaluated on this limited exam. Skull and upper cervical spine: No appreciable scalp edema or other abnormality. Sinuses/Orbits: Globes orbital soft tissues as well as the paranasal sinuses not well evaluated on this limited exam. Other: None. IMPRESSION: 1. Limited study with only axial diffusion-weighted  sequence performed. 2. No acute intracranial infarct. No other definite intracranial abnormality identified on this limited exam. Electronically Signed   By: Rise Mu M.D.   On: 04/04/2017 20:23   Mr Laqueta Jean ZO Contrast  Result  Date: 04/06/2017 CLINICAL DATA:  Encephalopathy EXAM: MRI HEAD WITHOUT AND WITH CONTRAST TECHNIQUE: Multiplanar, multiecho pulse sequences of the brain and surrounding structures were obtained without and with intravenous contrast. CONTRAST:  20mL MULTIHANCE GADOBENATE DIMEGLUMINE 529 MG/ML IV SOLN COMPARISON:  MRI and CT 04/04/2017 FINDINGS: Brain: No acute infarction, hemorrhage, hydrocephalus, extra-axial collection or mass lesion. Normal enhancement postcontrast infusion Vascular: Normal arterial flow voids. Skull and upper cervical spine: Negative Sinuses/Orbits: Mild mucosal edema paranasal sinuses.  Normal orbit. Other: None IMPRESSION: Negative MRI head with contrast. Electronically Signed   By: Marlan Palau M.D.   On: 04/06/2017 08:26   Dg Fluoro Guided Needle Plc Aspiration/injection Loc  Result Date: 04/06/2017 CLINICAL DATA:  Mental status changes. EXAM: DIAGNOSTIC LUMBAR PUNCTURE UNDER FLUOROSCOPIC GUIDANCE FLUOROSCOPY TIME:  Fluoroscopy Time:  3 minutes and 12 seconds Radiation Exposure Index (if provided by the fluoroscopic device): Not applicable. Number of Acquired Spot Images: 1 PROCEDURE: Informed consent was obtained from the the patient's wife, via phone conversation, prior to the procedure, including potential complications of allergy, and pain. With the patient prone, the lower back was prepped with Betadine. 1% Lidocaine was used for local anesthesia. Lumbar puncture was performed at the L3/4 level using a 20 gauge needle with return of clear CSF with an opening pressure of 16 cm water. 10 ml of CSF were obtained for laboratory studies. The patient tolerated the procedure well and there were no apparent complications. IMPRESSION: Non complicated lumbar puncture, as detailed above. Electronically Signed   By: Jeronimo Greaves M.D.   On: 04/06/2017 09:57   US Abdomen Limited Ruq  Result Date: 04/04/2017 CLINICAL DATA:  Abdominal pain x2 days. Abnormal liver function tests. EXAM: ULTRASOUND  ABDOMEN LIMITED RIGHT UPPER QUADRANT COMPARISON:  CT 10/17/2014 FINDINGS: Gallbladder: No gallstones or wall thickening visualized. No sonographic Murphy sign noted by sonographer. Common bile duct: Diameter: Normal at 5.1 mm.  No choledocholithiasis. Liver: No focal lesion identified. Mild hepatomegaly with sagittal span measuring 18.7 cm. Within normal limits in parenchymal echogenicity. Portal vein is patent on color Doppler imaging with normal direction of blood flow towards the liver. IMPRESSION: Mild hepatomegaly. No biliary obstruction or space-occupying mass of the liver. Electronically Signed   By: Tollie Eth M.D.   On: 04/04/2017 23:01     Felicie Morn PA-C [facility] [phone]  Long discussion of greater than 30 minutes was had with family about diagnosis.     NEUROHOSPITALIST ADDENDUM Seen and examined the patient today. Formulated plan as documented above by PAC/Resident. Discussion below.   Impression: 37 year old male presenting with lethargy and confusion.  LP was obtained and showed significantly high white blood cell count of 510 predominantly lymphocytes.    Prophylactic Acyclovir has been started with HSV is pending. I have lower suspicion it is HSV as this is usually associated more fulminant presentation, and patient has normal MRI Brain, no epileptiform discharges/PLEDS on EEG and afebrile on admission.  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 ID consulted.   No recent weight loss and acute presentation makes paraneoplastic syndrome less likely.  No international travel, tick bite.   Recommendations: 1) awaiting multiple labs including HSV, HIV, enterovirus, VDRL, fungal stain, CSF culture, cytology, oligoclonal bands 2) acyclovir has been  started per pharmacy dosing 3) ID consult, follow recommendations 4) Treat agitatation and psychosis as needed   Georgiana Spinner Aroor MD [facility] [phone]  If 7pm to 7am, please  call on call as listed on AMION.

## 2017-04-07 DIAGNOSIS — R740 Nonspecific elevation of levels of transaminase and lactic acid dehydrogenase [LDH]: Secondary | ICD-10-CM

## 2017-04-07 DIAGNOSIS — G9340 Encephalopathy, unspecified: Secondary | ICD-10-CM

## 2017-04-07 DIAGNOSIS — Z8669 Personal history of other diseases of the nervous system and sense organs: Secondary | ICD-10-CM

## 2017-04-07 LAB — PATHOLOGIST SMEAR REVIEW: PATH REVIEW: INCREASED

## 2017-04-07 LAB — RPR: RPR Ser Ql: NONREACTIVE

## 2017-04-07 NOTE — Care Management Note (Signed)
Case Management Note  Patient Details  Name: Arlyce HarmanMichael H Pociask MRN: [id number] Date of Birth: 1980/10/07  Subjective/Objective:    Pt in to r/o meningioencephalitis. He is from home with his spouse.                Action/Plan: Pt currently on IV acyclovir. Plan is for home when medically stable. CM following for d/c needs, physician orders.  Expected Discharge Date:  04/07/17               Expected Discharge Plan:  Home/Self Care  In-House Referral:     Discharge planning Services     Post Acute Care Choice:    Choice offered to:     DME Arranged:    DME Agency:     HH Arranged:    HH Agency:     Status of Service:  In process, will continue to follow  If discussed at Long Length of Stay Meetings, dates discussed:    Additional Comments:  Kermit BaloKelli F Leonid Manus, RN 04/07/2017, 10:40 AM

## 2017-04-07 NOTE — Progress Notes (Signed)
Subjective: Patient is feeling much better today.  He is sitting up in bed fully alert and oriented.  He is able to name objects, follow my commands, tell me there is 4 quarters in a dollar, he is oriented to date, year, month.  He does not have a significant recollection of yesterday however.  Exam: Vitals:   04/07/17 0046 04/07/17 0608  BP: (!) 147/60 (!) 147/75  Pulse:  67  Resp:  18  Temp:  98.2 F (36.8 C)  SpO2: 98% 97%    HEENT-  Normocephalic, no lesions, without obvious abnormality.  Normal external eye and conjunctiva.  Normal TM's bilaterally.  Normal auditory canals and external ears. Normal external nose, mucus membranes and septum.  Normal pharynx. Cardiovascular- S1, S2 normal, pulses palpable throughout   Lungs- chest clear, no wheezing, rales, normal symmetric air entry Abdomen- normal findings: bowel sounds normal Extremities- no edema Lymph-no adenopathy palpable Musculoskeletal-no joint tenderness, deformity or swelling Skin-warm and dry, no hyperpigmentation, vitiligo, or suspicious lesions   Neuro:  CN: Pupils are equal and round. They are symmetrically reactive from 3-->2 mm. EOMI without nystagmus. Facial sensation is intact to light touch. Face is symmetric at rest with normal strength and mobility. Hearing is intact to conversational voice. Palate elevates symmetrically and uvula is midline. Voice is normal in tone, pitch and quality. Bilateral SCM and trapezii are 5/5. Tongue is midline with normal bulk and mobility.  Motor: Normal bulk, tone, and strength. 5/5 throughout. No drift. --Essential tremor which he has had for a prolonged period of time Sensation: Intact to light touch.  DTRs: 2+, symmetric  Toes downgoing bilaterally. No pathologic reflexes.  Coordination: Finger-to-nose and heel-to-shin are without dysmetria   Medications:  Scheduled: . enoxaparin (LOVENOX) injection  80 mg Subcutaneous Q24H  . nicotine  21 mg Transdermal Daily  .  sodium chloride flush  3 mL Intravenous Q12H   Continuous: . sodium chloride 125 mL/hr at 04/07/17 0602  . sodium chloride    . acyclovir Stopped (04/07/17 0727)   BMW:UXLKGMPRN:sodium chloride, ALPRAZolam, sodium chloride flush  Pertinent Labs/Diagnostics: HSV PCR still pending. HIV antibody is negative   Mr Laqueta JeanBrain W Wo Contrast  Result Date: 04/06/2017 CLINICAL DATA:  Encephalopathy EXAM: MRI HEAD WITHOUT AND WITH CONTRAST TECHNIQUE: Multiplanar, multiecho pulse sequences of the brain and surrounding structures were obtained without and with intravenous contrast. CONTRAST:  20mL MULTIHANCE GADOBENATE DIMEGLUMINE 529 MG/ML IV SOLN COMPARISON:  MRI and CT 04/04/2017 FINDINGS: Brain: No acute infarction, hemorrhage, hydrocephalus, extra-axial collection or mass lesion. Normal enhancement postcontrast infusion Vascular: Normal arterial flow voids. Skull and upper cervical spine: Negative Sinuses/Orbits: Mild mucosal edema paranasal sinuses.  Normal orbit. Other: None IMPRESSION: Negative MRI head with contrast. Electronically Signed   By: Marlan Palauharles  Clark M.D.   On: 04/06/2017 08:26   Dg Fluoro Guided Needle Plc Aspiration/injection Loc  Result Date: 04/06/2017 CLINICAL DATA:  Mental status changes. EXAM: DIAGNOSTIC LUMBAR PUNCTURE UNDER FLUOROSCOPIC GUIDANCE FLUOROSCOPY TIME:  Fluoroscopy Time:  3 minutes and 12 seconds Radiation Exposure Index (if provided by the fluoroscopic device): Not applicable. Number of Acquired Spot Images: 1 PROCEDURE: Informed consent was obtained from the the patient's wife, via phone conversation, prior to the procedure, including potential complications of allergy, and pain. With the patient prone, the lower back was prepped with Betadine. 1% Lidocaine was used for local anesthesia. Lumbar puncture was performed at the L3/4 level using a 20 gauge needle with return of clear  CSF with an opening pressure of 16 cm water. 10 ml of CSF were obtained for laboratory studies. The patient  tolerated the procedure well and there were no apparent complications. IMPRESSION: Non complicated lumbar puncture, as detailed above. Electronically Signed   By: Jeronimo Greaves M.D.   On: 04/06/2017 09:57     Felicie Morn PA-C [facility] [phone]  Impression:  37 year old male presenting with lethargy and confusion.  LP was obtained and showed significantly high white blood cell count of 510 predominantly lymphocytes.   Today patient is significantly improved sitting up in his bed and answering questions appropriately.  He is alert oriented and following commands  Prophylactic Acyclovir has been started with HSV is pending.  At this point still lower suspicion it is HSV as this is usually associated more fulminant presentation, and patient has normal MRI Brain, no epileptiform discharges/PLEDS on EEG and afebrile on admission.  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 ID consulted.   No recent weight loss and acute presentation makes paraneoplastic syndrome less likely.  No international travel, tick bite.   Recommendations:--No significant change in recommendations 1) awaiting multiple labs including HSV, , enterovirus, VDRL, fungal stain, CSF culture, cytology, oligoclonal bands 2) acyclovir has been started per pharmacy dosing 3) ID consult, follow recommendations 4) Treat agitatation and psychosis as needed        04/07/2017, 10:03 AM    NEUROHOSPITALIST ADDENDUM Seen and examined the patient today. Formulated plan as documented above by PAC/Resident. I agree with recommendations as above.  Patient appears to be less agitated today, sitting up in bed. Afebrile, still awaiting on HSV PCR.  Georgiana Spinner Elinore Shults MD [facility] [phone]  If 7pm to 7am, please call on call as listed on AMION.

## 2017-04-07 NOTE — Progress Notes (Signed)
[facility]  [facility] will follow pt with ID team to support if needed at DC to home.  If patient discharges after hours, please call [phone].   Sedalia Mutaamela S Chandler 04/07/2017, 8:38 AM

## 2017-04-07 NOTE — Progress Notes (Signed)
[facility]  Date of Admission:  04/04/2017     Total days of antibiotics 2  Acyclovir 04/07/17   Patient ID: 37 yo male with        Principal Problem:   Meningoencephalitis Active Problems:   Altered mental status   Transaminitis   Abnormal liver function   SUBJECTIVE: Feeling better today. His cousin (whom is a Engineer, civil (consulting) at [facility] ED) is at his bedside today helping with his care. Clayton Jensen can tell me what year and month it is accurately in addition to his children's names and 2/3 of their ages. He can tell me his address correctly. He continues to have trouble with short term memory/recall. He would like to go home. No further headaches and does not feel as tired today.   His cousin reported to me today that Clayton Jensen has a baseline tremor but what she can see today it is still more severe than baseline. Unsteady gait and required some assistance washing up/ADLs.   Review of Systems: Review of Systems  Constitutional: Positive for malaise/fatigue. Negative for chills and fever.  HENT: Negative for congestion and sore throat.   Eyes: Negative for blurred vision and photophobia.  Respiratory: Negative for cough and shortness of breath.   Cardiovascular: Negative for chest pain and leg swelling.  Gastrointestinal: Negative for abdominal pain and diarrhea.  Genitourinary: Negative for dysuria and frequency. Flank pain: chronic.  Musculoskeletal: Positive for back pain. Negative for myalgias.  Skin: Negative for rash.  Neurological: Positive for tremors and weakness. Negative for dizziness and seizures.  Psychiatric/Behavioral: Positive for memory loss.    No Known Allergies  OBJECTIVE: Vitals:   04/07/17 0040 04/07/17 0046 04/07/17 0608 04/07/17 1041  BP: (!) 78/56 (!) 147/60 (!) 147/75 (!) 141/72  Pulse: 69  67 80  Resp: 18  18 18   Temp: 97.8 F (36.6 C)  98.2 F (36.8 C) 98 F (36.7 C)  TempSrc: Oral  Oral Oral  SpO2: 98% 98% 97% 95%   Weight:      Height:       Body mass index is 47.7 kg/m.  Physical Exam  Constitutional: He is well-developed, well-nourished, and in no distress.  HENT:  Mouth/Throat: Oropharynx is clear and moist.  Eyes: Conjunctivae are normal. Pupils are equal, round, and reactive to light. No scleral icterus.  Neck: Normal range of motion. No neck rigidity. Normal range of motion present. No Brudzinski's sign and no Kernig's sign noted.  Cardiovascular: Normal rate, regular rhythm, normal heart sounds and normal pulses.  Pulmonary/Chest: Effort normal and breath sounds normal. He has no wheezes.  Abdominal: Soft. Bowel sounds are normal. There is no tenderness.  Lymphadenopathy:    He has no cervical adenopathy.  Neurological: He is alert. He has normal sensation and normal strength. He is not agitated. He displays tremor. He displays no weakness and normal stance. No cranial nerve deficit. He exhibits normal muscle tone. He has an abnormal Romberg Test. Coordination and gait normal.  Balance is unsteady - requires the use of wall/objects to steady himself walking in the room. Otherwise normal gait. Slow, coordinated movements    Skin: Skin is warm and dry.  Psychiatric: His mood appears anxious. He exhibits disordered thought content.   Lab Results Lab Results  Component Value Date   WBC 8.7 04/05/2017   HGB 14.5 04/05/2017   HCT 42.2 04/05/2017   MCV 85.3 04/05/2017   PLT 154 04/05/2017  Lab Results  Component Value Date   CREATININE 0.84 04/06/2017   BUN 10 04/06/2017   NA 137 04/06/2017   K 4.7 04/06/2017   CL 108 04/06/2017   CO2 20 (L) 04/06/2017    Lab Results  Component Value Date   ALT 135 (H) 04/06/2017   AST 106 (H) 04/06/2017   ALKPHOS 64 04/06/2017   BILITOT 1.3 (H) 04/06/2017     Microbiology: Recent Results (from the past 240 hour(s))  CSF culture     Status: None (Preliminary result)   Collection Time: 04/06/17  9:43 AM  Result Value Ref Range Status    Specimen Description CSF  Final   Special Requests NONE  Final   Gram Stain   Final    CYTOSPIN SMEAR WBC PRESENT, PREDOMINANTLY MONONUCLEAR NO ORGANISMS SEEN    Culture NO GROWTH 1 DAY  Final   Report Status PENDING  Incomplete   ASSESSMENT/PLAN:  1. Meningoencephalitis = slow improvements noted today on neurologic exam regarding alertness, conversation efforts, headaches as well as gross and coordinated muscle movements improved. CSF Cx without growth. RPR negative.    HSV PCR on CSF pending. Continue empiric treatment with Acyclovir.   If this is negative - there is a few cc's of CSF in the lab left from initial sample. I would consider sending this off for further viral testing as he and his family I believe would value this reassurance of best attempts at a definitive diagnosis. His wife is very concerned about recovery and long term prognosis.   I would consider addition of PT/OT for him while he is here to better assess readiness for discharge needs.   Rexene AlbertsStephanie Anaid Haney, MSN, NP-C [facility] Pager: [phone]  04/07/2017  11:30 AM

## 2017-04-07 NOTE — Progress Notes (Signed)
[facility] PROGRESS NOTE  Clayton Jensen PVV:[id number] DOB: Aug 20, 1980 DOA: 04/04/2017   PCP: Mikey Kirschner, MD  Brief history of present illness 37 year old male [facility] for evaluation of altered mental status, or behavior. Patient has been disoriented to time and place. Answering questions inappropriately. Transferred from [facility] for MRI and further evaluation. MRI negative but LP suggestive of viral meningitis   Assessment/Plan: Principal Problem:   Meningoencephalitis Active Problems:   Altered mental status   Transaminitis   Abnormal liver function    AMS, likely secondary to viral meningitis B-12, folate, esr, , tsh, ammonia all within normal limits Urine drug screen negative UA negative Chest x-ray negative MRI of the brain within normal limits, no acute abnormality EEG -slowing with nonspecific encephalopathy Neurology consult appreciated-is suspected that the patient may have underlying obstructive sleep apnea LP showed 510 white blood cells with 91% lymphocytes, suggestive of viral meningitis, patient started on acyclovir and infectious disease Dr. Megan Salon was consulted. He has been seen by infectious disease.  Appreciate help.  HSV PCR is still pending at this time.  For now continue treatment with IV acyclovir.  Hypomagnesemia-repleted  Abnormal liver function Acute hepatitis panel negative RUQ ultrasound-mild hepatomegaly, no biliary obstruction or space-occupying lesion CK 114  Hyperglycemia Hemoglobin A1c 5.5  Agitation/anxiety Patient has been started on Xanax PRN    DVT Prophylaxis  Lovenox - SCDs   Code Status:   Full code   Family Communication: Discussed in detail with the patient and extended family in the room, all imaging results, lab results explained to the patient   Disposition Plan:   Pending further workup    Consultants:  Neurology  Procedures:  None  Antibiotics: Anti-infectives (From admission, onward)   Start     Dose/Rate Route Frequency Ordered Stop   04/06/17 1400  acyclovir (ZOVIRAX) 1,135 mg in dextrose 5 % 250 mL IVPB     10 mg/kg  113.5 kg (Adjusted) 272.7 mL/hr over 60 Minutes Intravenous Every 8 hours 04/06/17 1303        HPI/Subjective: No acute events overnight.  Patient alert and oriented.  He denies having any headache or any other acute symptoms at this time.  Objective: Vitals:   04/07/17 1041 04/07/17 1247 04/07/17 1255 04/07/17 1537  BP: (!) 141/72 106/60 (!) 176/81 (!) 109/55  Pulse: 80 74 69 72  Resp: _0 Temp: 98 F (36.7 C) 98 F (36.7 C) 98.1 F (36.7 C) 98.2 F (36.8 C)  TempSrc: Oral Oral Oral Oral  SpO2: 95% 95% 98% 97%  Weight:      Height:        Intake/Output Summary (Last 24 hours) at 04/07/2017 1558 Last data filed at 04/07/2017 1500 Gross per 24 hour  Intake 5891.03 ml  Output 400 ml  Net 5491.03 ml    Exam:  Examination:  General exam: Appears calm and comfortable  Respiratory system: Clear to auscultation. Respiratory effort normal. Cardiovascular system: S1 & S2 heard, RRR. No JVD, murmurs, rubs, gallops or clicks. No pedal edema. Gastrointestinal system: Abdomen is nondistended, soft and nontender. No organomegaly or masses felt. Normal bowel sounds heard. Central nervous system: Alert and oriented to self. No focal neurological deficits. Extremities: Symmetric 5 x 5 power. Skin: No rashes, lesions or ulcers Psychiatry:   impaired judgment and insight     Data Reviewed: I have personally reviewed following labs and imaging studies  Micro Results  Recent Results (from the past 240 hour(s))  CSF culture     Status: None (Preliminary result)   Collection Time: 04/06/17  9:43 AM  Result Value Ref Range Status   Specimen Description CSF  Final   Special Requests NONE  Final   Gram Stain   Final    CYTOSPIN  SMEAR WBC PRESENT, PREDOMINANTLY MONONUCLEAR NO ORGANISMS SEEN    Culture NO GROWTH 1 DAY  Final   Report Status PENDING  Incomplete    Radiology Reports Dg Chest 2 View  Result Date: 04/04/2017 CLINICAL DATA:  Altered mental status EXAM: CHEST  2 VIEW COMPARISON:  None. FINDINGS: Lungs are clear. Heart size and pulmonary vascularity are normal. No adenopathy. No bone lesions. IMPRESSION: No edema or consolidation. Electronically Signed   By: Lowella Grip III M.D.   On: 04/04/2017 09:08   Ct Head Wo Contrast  Result Date: 04/04/2017 CLINICAL DATA:  37 year old male with headache since waking yesterday. Nausea and malaise. Odd behavior reported by family. Bilateral lower extremity swelling. EXAM: CT HEAD WITHOUT CONTRAST TECHNIQUE: Contiguous axial images were obtained from the base of the skull through the vertex without intravenous contrast. COMPARISON:  Skull radiographs 03/28/2007. FINDINGS: Brain: Cerebral volume is within normal limits. No midline shift, ventriculomegaly, mass effect, evidence of mass lesion, intracranial hemorrhage or evidence of cortically based acute infarction. Gray-white matter differentiation is within normal limits throughout the brain. Vascular: No suspicious intracranial vascular hyperdensity. Skull: No osseous abnormality identified. Sinuses/Orbits: Only minor paranasal sinus mucosal thickening. Tympanic cavities and mastoids are clear. Other: Visualized orbits and scalp soft tissues are within normal limits. IMPRESSION: Negative head CT.  Normal noncontrast CT appearance of the brain. Electronically Signed   By: Genevie Ann M.D.   On: 04/04/2017 08:40   Mr Brain Wo Contrast  Result Date: 04/04/2017 CLINICAL DATA:  Initial evaluation for acute altered mental status. EXAM: MRI HEAD WITHOUT CONTRAST TECHNIQUE: Multiplanar, multiecho pulse sequences of the brain and surrounding structures were obtained without intravenous contrast. COMPARISON:  Priors CT from  earlier the same day. FINDINGS: Brain: Study is severely limited as the patient was unable to tolerate the exam due to severe claustrophobia. Ulna knee axial diffusion-weighted sequence was performed. Diffusion-weighted imaging demonstrates no evidence for acute or subacute ischemia. Gray-white matter differentiation grossly maintained. No obvious mass lesion. No midline shift or mass effect. No hydrocephalus. No appreciable extra-axial fluid collection. Vascular: Not evaluated on this limited exam. Skull and upper cervical spine: No appreciable scalp edema or other abnormality. Sinuses/Orbits: Globes orbital soft tissues as well as the paranasal sinuses not well evaluated on this limited exam. Other: None. IMPRESSION: 1. Limited study with only axial diffusion-weighted sequence performed. 2. No acute intracranial infarct. No other definite intracranial abnormality identified on this limited exam. Electronically Signed   By: Jeannine Boga M.D.   On: 04/04/2017 20:23   Mr Jeri Cos PY Contrast  Result Date: 04/06/2017 CLINICAL DATA:  Encephalopathy EXAM: MRI HEAD WITHOUT AND WITH CONTRAST TECHNIQUE: Multiplanar, multiecho pulse sequences of the brain and surrounding structures were obtained without and with intravenous contrast. CONTRAST:  49m MULTIHANCE GADOBENATE DIMEGLUMINE 529 MG/ML IV SOLN COMPARISON:  MRI and CT 04/04/2017 FINDINGS: Brain: No acute infarction, hemorrhage, hydrocephalus, extra-axial collection or mass lesion. Normal enhancement postcontrast infusion Vascular: Normal arterial flow voids. Skull and upper cervical spine: Negative Sinuses/Orbits: Mild mucosal edema paranasal sinuses.  Normal orbit. Other: None IMPRESSION: Negative MRI head with contrast. Electronically Signed   By: CJorja LoaD.  On: 04/06/2017 08:26   Dg Fluoro Guided Needle Plc Aspiration/injection Loc  Result Date: 04/06/2017 CLINICAL DATA:  Mental status changes. EXAM: DIAGNOSTIC LUMBAR PUNCTURE UNDER  FLUOROSCOPIC GUIDANCE FLUOROSCOPY TIME:  Fluoroscopy Time:  3 minutes and 12 seconds Radiation Exposure Index (if provided by the fluoroscopic device): Not applicable. Number of Acquired Spot Images: 1 PROCEDURE: Informed consent was obtained from the the patient's wife, via phone conversation, prior to the procedure, including potential complications of allergy, and pain. With the patient prone, the lower back was prepped with Betadine. 1% Lidocaine was used for local anesthesia. Lumbar puncture was performed at the L3/4 level using a 20 gauge needle with return of clear CSF with an opening pressure of 16 cm water. 10 ml of CSF were obtained for laboratory studies. The patient tolerated the procedure well and there were no apparent complications. IMPRESSION: Non complicated lumbar puncture, as detailed above. Electronically Signed   By: Abigail Miyamoto M.D.   On: 04/06/2017 09:57   US Abdomen Limited Ruq  Result Date: 04/04/2017 CLINICAL DATA:  Abdominal pain x2 days. Abnormal liver function tests. EXAM: ULTRASOUND ABDOMEN LIMITED RIGHT UPPER QUADRANT COMPARISON:  CT 10/17/2014 FINDINGS: Gallbladder: No gallstones or wall thickening visualized. No sonographic Murphy sign noted by sonographer. Common bile duct: Diameter: Normal at 5.1 mm.  No choledocholithiasis. Liver: No focal lesion identified. Mild hepatomegaly with sagittal span measuring 18.7 cm. Within normal limits in parenchymal echogenicity. Portal vein is patent on color Doppler imaging with normal direction of blood flow towards the liver. IMPRESSION: Mild hepatomegaly. No biliary obstruction or space-occupying mass of the liver. Electronically Signed   By: Ashley Royalty M.D.   On: 04/04/2017 23:01     CBC Recent Labs  Lab 04/04/17 0759 04/05/17 0411  WBC 9.2 8.7  HGB 15.0 14.5  HCT 44.0 42.2  PLT 160 154  MCV 85.1 85.3  MCH 29.0 29.3  MCHC 34.1 34.4  RDW 14.1 14.4  LYMPHSABS 2.8  --   MONOABS 0.6  --   EOSABS 0.1  --   BASOSABS 0.0   --     Chemistries  Recent Labs  Lab 04/04/17 0759 04/04/17 2112 04/05/17 0411 04/06/17 1029  NA 139  --  139 137  K 3.7  --  3.8 4.7  CL 104  --  105 108  CO2 25  --  24 20*  GLUCOSE 107*  --  95 97  BUN 11  --  9 10  CREATININE 0.77  --  0.82 0.84  CALCIUM 8.9  --  8.9 8.6*  MG  --  1.6*  --   --   AST 91*  --  94* 106*  ALT 133*  --  134* 135*  ALKPHOS 70  --  70 64  BILITOT 0.9  --  0.7 1.3*   ------------------------------------------------------------------------------------------------------------------ estimated creatinine clearance is 191.4 mL/min (by C-G formula based on SCr of 0.84 mg/dL). ------------------------------------------------------------------------------------------------------------------ Recent Labs    04/05/17 0411  HGBA1C 5.5   ------------------------------------------------------------------------------------------------------------------ No results for input(s): CHOL, HDL, LDLCALC, TRIG, CHOLHDL, LDLDIRECT in the last 72 hours. ------------------------------------------------------------------------------------------------------------------ Recent Labs    04/04/17 2112  TSH 3.859   ------------------------------------------------------------------------------------------------------------------ Recent Labs    04/04/17 2112  VITAMINB12 646    Coagulation profile No results for input(s): INR, PROTIME in the last 168 hours.  No results for input(s): DDIMER in the last 72 hours.  Cardiac Enzymes No results for input(s): CKMB, TROPONINI, MYOGLOBIN in the last 168 hours.  Invalid input(s):  CK ------------------------------------------------------------------------------------------------------------------ Invalid input(s): POCBNP   CBG: Recent Labs  Lab 04/04/17 0807  GLUCAP 102*       Studies: Mr Jeri Cos Wo Contrast  Result Date: 04/06/2017 CLINICAL DATA:  Encephalopathy EXAM: MRI HEAD WITHOUT AND WITH CONTRAST  TECHNIQUE: Multiplanar, multiecho pulse sequences of the brain and surrounding structures were obtained without and with intravenous contrast. CONTRAST:  34m MULTIHANCE GADOBENATE DIMEGLUMINE 529 MG/ML IV SOLN COMPARISON:  MRI and CT 04/04/2017 FINDINGS: Brain: No acute infarction, hemorrhage, hydrocephalus, extra-axial collection or mass lesion. Normal enhancement postcontrast infusion Vascular: Normal arterial flow voids. Skull and upper cervical spine: Negative Sinuses/Orbits: Mild mucosal edema paranasal sinuses.  Normal orbit. Other: None IMPRESSION: Negative MRI head with contrast. Electronically Signed   By: CFranchot GalloM.D.   On: 04/06/2017 08:26   Dg Fluoro Guided Needle Plc Aspiration/injection Loc  Result Date: 04/06/2017 CLINICAL DATA:  Mental status changes. EXAM: DIAGNOSTIC LUMBAR PUNCTURE UNDER FLUOROSCOPIC GUIDANCE FLUOROSCOPY TIME:  Fluoroscopy Time:  3 minutes and 12 seconds Radiation Exposure Index (if provided by the fluoroscopic device): Not applicable. Number of Acquired Spot Images: 1 PROCEDURE: Informed consent was obtained from the the patient's wife, via phone conversation, prior to the procedure, including potential complications of allergy, and pain. With the patient prone, the lower back was prepped with Betadine. 1% Lidocaine was used for local anesthesia. Lumbar puncture was performed at the L3/4 level using a 20 gauge needle with return of clear CSF with an opening pressure of 16 cm water. 10 ml of CSF were obtained for laboratory studies. The patient tolerated the procedure well and there were no apparent complications. IMPRESSION: Non complicated lumbar puncture, as detailed above. Electronically Signed   By: KAbigail MiyamotoM.D.   On: 04/06/2017 09:57      Lab Results  Component Value Date   HGBA1C 5.5 04/05/2017   Lab Results  Component Value Date   CREATININE 0.84 04/06/2017       Scheduled Meds: . enoxaparin (LOVENOX) injection  80 mg Subcutaneous Q24H  .  nicotine  21 mg Transdermal Daily  . sodium chloride flush  3 mL Intravenous Q12H   Continuous Infusions: . sodium chloride 125 mL/hr at 04/07/17 0602  . sodium chloride    . acyclovir Stopped (04/07/17 1500)     LOS: 1 day    AYaakov Guthrie MD  [facility] Pager [phone] If 7PM-7AM, please contact night-coverage at [email or web address], password [email or web address]1/06/2017, 3:58 PM  LOS: 1 day

## 2017-04-08 DIAGNOSIS — G250 Essential tremor: Secondary | ICD-10-CM

## 2017-04-08 LAB — CBC
HEMATOCRIT: 41.9 % (ref 39.0–52.0)
Hemoglobin: 14.8 g/dL (ref 13.0–17.0)
MCH: 29.8 pg (ref 26.0–34.0)
MCHC: 35.3 g/dL (ref 30.0–36.0)
MCV: 84.5 fL (ref 78.0–100.0)
PLATELETS: 152 10*3/uL (ref 150–400)
RBC: 4.96 MIL/uL (ref 4.22–5.81)
RDW: 14 % (ref 11.5–15.5)
WBC: 8.4 10*3/uL (ref 4.0–10.5)

## 2017-04-08 LAB — COMPREHENSIVE METABOLIC PANEL
ALBUMIN: 3.3 g/dL — AB (ref 3.5–5.0)
ALT: 157 U/L — ABNORMAL HIGH (ref 17–63)
ANION GAP: 7 (ref 5–15)
AST: 106 U/L — AB (ref 15–41)
Alkaline Phosphatase: 76 U/L (ref 38–126)
BILIRUBIN TOTAL: 0.6 mg/dL (ref 0.3–1.2)
BUN: 9 mg/dL (ref 6–20)
CHLORIDE: 108 mmol/L (ref 101–111)
CO2: 24 mmol/L (ref 22–32)
Calcium: 9 mg/dL (ref 8.9–10.3)
Creatinine, Ser: 0.71 mg/dL (ref 0.61–1.24)
GFR calc Af Amer: >60 mL/min (ref >60)
GFR calc non Af Amer: >60 mL/min (ref >60)
GLUCOSE: 100 mg/dL — AB (ref 65–99)
POTASSIUM: 3.9 mmol/L (ref 3.5–5.1)
SODIUM: 139 mmol/L (ref 135–145)
Total Protein: 6.6 g/dL (ref 6.5–8.1)

## 2017-04-08 LAB — FUNGUS STAIN

## 2017-04-08 LAB — HERPES SIMPLEX VIRUS(HSV) DNA BY PCR
HSV 1 DNA: NEGATIVE
HSV 2 DNA: NEGATIVE

## 2017-04-08 LAB — FUNGAL STAIN REFLEX

## 2017-04-08 LAB — AMMONIA: Ammonia: 34 umol/L (ref 9–35)

## 2017-04-08 NOTE — Discharge Summary (Signed)
Physician Discharge Summary  Clayton Jensen EGB:[id number] DOB: 07/10/1980 DOA: 04/04/2017  PCP: Mikey Kirschner, MD  Admit date: 04/04/2017 Discharge date: 04/08/2017  Admitted From: Home Disposition:  Home  Recommendations for Outpatient Follow-up:  1. Follow up with PCP in 1-2 weeks 2. Please obtain BMP/CBC in one week 3. Please follow up on the following pending results:  Discharge Condition: stable CODE STATUS: full Diet recommendation: Heart Healthy   Brief/Interim Summary: Patient is a 37 year old male transferred from [facility] for evaluation of altered mental status, or behavior. Patient was been disoriented to time and place. Transferred from [facility] for MRI and further evaluation. MRI negative but LP suggestive of viral meningitis.  He was admitted with the following problems: AMS, likely secondary to viral meningitis B-12, folate, esr, , tsh, ammonia all within normal limits Urine drug screen negative UA negative Chest x-ray negative MRI of the brain within normal limits, no acute abnormality EEG -slowing with nonspecific encephalopathy Neurology consulted. Suspected that the patient may have underlying obstructive sleep apnea LP showed 510 white blood cells with 91% lymphocytes, suggestive of viral meningitis, patient was started on acyclovir and infectious disease Dr. Megan Salon was consulted. He has been seen by infectious disease. HSV PCR negative.    IV acyclovir was discontinued.  He improved significantly and is back to his normal baseline.  He was seen by the consultants and they are okay with him being discharged.  Hypomagnesemia-repleted  Abnormal liver function Acute hepatitis panel negative RUQ ultrasound-mild hepatomegaly, no biliary obstruction or space-occupying lesion CK 114.  He also had mildly elevated ammonia which is trending down.  Currently alert and oriented x3 and is ambulating.  Patient advised to follow-up outpatient to  monitor his LFTs.  They already have follow-up appointments.  Hyperglycemia Hemoglobin A1c 5.5  Agitation/anxiety Initially the patient was anxious likely secondary to his disease symptomatology.  He was given Xanax as needed.  Currently his anxiety has resolved.  He was on triamterene at home taken as needed for lower extremity swelling.  However, here his blood pressure has been normal and he does not have any lower extremity edema.  For the time getting has been discontinued.  I advised him to follow-up with his primary care physician.  Vitals, physical examination at the time of discharge fairly stable.  Discharge Diagnoses:  Principal Problem:   Meningoencephalitis Active Problems:   Abnormal liver function   Encephalopathy   Tremor, essential  Discharge Instructions Patient says he feels a lot better.  He is wanting to be discharged home.  Answered all of their questions appropriately to the best of my knowledge.  Plan of care discussed with the patient as well as his wife at the bedside.  They verbalized the understanding and he is being discharged home in stable condition.  Allergies as of 04/08/2017   No Known Allergies     Medication List    STOP taking these medications   triamterene-hydrochlorothiazide 37.5-25 MG capsule Commonly known as:  DYAZIDE      Follow-up Information    Mikey Kirschner, MD. Call.   Specialty:  Family Medicine Why:  Hospital follow-up in 3-5 days, repeat CBC, CMP Contact information: [address] [place] [address] [phone]          No Known Allergies  Consultations:  Neurology  Infectious Disease   Procedures/Studies: Dg Chest 2 View  Result Date: 04/04/2017 CLINICAL DATA:  Altered mental status EXAM: CHEST  2 VIEW  COMPARISON:  None. FINDINGS: Lungs are clear. Heart size and pulmonary vascularity are normal. No adenopathy. No bone lesions. IMPRESSION: No edema or consolidation. Electronically Signed    By: Lowella Grip III M.D.   On: 04/04/2017 09:08   Ct Head Wo Contrast  Result Date: 04/04/2017 CLINICAL DATA:  37 year old male with headache since waking yesterday. Nausea and malaise. Odd behavior reported by family. Bilateral lower extremity swelling. EXAM: CT HEAD WITHOUT CONTRAST TECHNIQUE: Contiguous axial images were obtained from the base of the skull through the vertex without intravenous contrast. COMPARISON:  Skull radiographs 03/28/2007. FINDINGS: Brain: Cerebral volume is within normal limits. No midline shift, ventriculomegaly, mass effect, evidence of mass lesion, intracranial hemorrhage or evidence of cortically based acute infarction. Gray-white matter differentiation is within normal limits throughout the brain. Vascular: No suspicious intracranial vascular hyperdensity. Skull: No osseous abnormality identified. Sinuses/Orbits: Only minor paranasal sinus mucosal thickening. Tympanic cavities and mastoids are clear. Other: Visualized orbits and scalp soft tissues are within normal limits. IMPRESSION: Negative head CT.  Normal noncontrast CT appearance of the brain. Electronically Signed   By: Genevie Ann M.D.   On: 04/04/2017 08:40   Mr Brain Wo Contrast  Result Date: 04/04/2017 CLINICAL DATA:  Initial evaluation for acute altered mental status. EXAM: MRI HEAD WITHOUT CONTRAST TECHNIQUE: Multiplanar, multiecho pulse sequences of the brain and surrounding structures were obtained without intravenous contrast. COMPARISON:  Priors CT from earlier the same day. FINDINGS: Brain: Study is severely limited as the patient was unable to tolerate the exam due to severe claustrophobia. Ulna knee axial diffusion-weighted sequence was performed. Diffusion-weighted imaging demonstrates no evidence for acute or subacute ischemia. Gray-white matter differentiation grossly maintained. No obvious mass lesion. No midline shift or mass effect. No hydrocephalus. No appreciable extra-axial fluid collection.  Vascular: Not evaluated on this limited exam. Skull and upper cervical spine: No appreciable scalp edema or other abnormality. Sinuses/Orbits: Globes orbital soft tissues as well as the paranasal sinuses not well evaluated on this limited exam. Other: None. IMPRESSION: 1. Limited study with only axial diffusion-weighted sequence performed. 2. No acute intracranial infarct. No other definite intracranial abnormality identified on this limited exam. Electronically Signed   By: Jeannine Boga M.D.   On: 04/04/2017 20:23   Mr Jeri Cos YD Contrast  Result Date: 04/06/2017 CLINICAL DATA:  Encephalopathy EXAM: MRI HEAD WITHOUT AND WITH CONTRAST TECHNIQUE: Multiplanar, multiecho pulse sequences of the brain and surrounding structures were obtained without and with intravenous contrast. CONTRAST:  60m MULTIHANCE GADOBENATE DIMEGLUMINE 529 MG/ML IV SOLN COMPARISON:  MRI and CT 04/04/2017 FINDINGS: Brain: No acute infarction, hemorrhage, hydrocephalus, extra-axial collection or mass lesion. Normal enhancement postcontrast infusion Vascular: Normal arterial flow voids. Skull and upper cervical spine: Negative Sinuses/Orbits: Mild mucosal edema paranasal sinuses.  Normal orbit. Other: None IMPRESSION: Negative MRI head with contrast. Electronically Signed   By: CFranchot GalloM.D.   On: 04/06/2017 08:26   Dg Fluoro Guided Needle Plc Aspiration/injection Loc  Result Date: 04/06/2017 CLINICAL DATA:  Mental status changes. EXAM: DIAGNOSTIC LUMBAR PUNCTURE UNDER FLUOROSCOPIC GUIDANCE FLUOROSCOPY TIME:  Fluoroscopy Time:  3 minutes and 12 seconds Radiation Exposure Index (if provided by the fluoroscopic device): Not applicable. Number of Acquired Spot Images: 1 PROCEDURE: Informed consent was obtained from the the patient's wife, via phone conversation, prior to the procedure, including potential complications of allergy, and pain. With the patient prone, the lower back was prepped with Betadine. 1% Lidocaine was used for  local anesthesia. Lumbar puncture was performed  at the L3/4 level using a 20 gauge needle with return of clear CSF with an opening pressure of 16 cm water. 10 ml of CSF were obtained for laboratory studies. The patient tolerated the procedure well and there were no apparent complications. IMPRESSION: Non complicated lumbar puncture, as detailed above. Electronically Signed   By: Abigail Miyamoto M.D.   On: 04/06/2017 09:57   US Abdomen Limited Ruq  Result Date: 04/04/2017 CLINICAL DATA:  Abdominal pain x2 days. Abnormal liver function tests. EXAM: ULTRASOUND ABDOMEN LIMITED RIGHT UPPER QUADRANT COMPARISON:  CT 10/17/2014 FINDINGS: Gallbladder: No gallstones or wall thickening visualized. No sonographic Murphy sign noted by sonographer. Common bile duct: Diameter: Normal at 5.1 mm.  No choledocholithiasis. Liver: No focal lesion identified. Mild hepatomegaly with sagittal span measuring 18.7 cm. Within normal limits in parenchymal echogenicity. Portal vein is patent on color Doppler imaging with normal direction of blood flow towards the liver. IMPRESSION: Mild hepatomegaly. No biliary obstruction or space-occupying mass of the liver. Electronically Signed   By: Ashley Royalty M.D.   On: 04/04/2017 23:01     Subjective: Patient feels a lot better.  He denies having any complaints at this time.  Discharge Exam: Vitals:   04/08/17 1011 04/08/17 1210  BP: (!) 110/57 117/61  Pulse: 73 77  Resp: 17 18  Temp: (!) 97.5 F (36.4 C) 97.8 F (36.6 C)  SpO2: 94% 94%   Vitals:   04/08/17 0434 04/08/17 0756 04/08/17 1011 04/08/17 1210  BP: (!) 131/43 120/69 (!) 110/57 117/61  Pulse: 70 87 73 77  Resp: 18 18 17 18   Temp:  97.8 F (36.6 C) (!) 97.5 F (36.4 C) 97.8 F (36.6 C)  TempSrc:  Oral Oral Oral  SpO2: 96% 95% 94% 94%  Weight:      Height:        General: Pt is alert, awake, not in acute distress Cardiovascular: RRR, S1/S2 +, no rubs, no gallops Respiratory: CTA bilaterally, no wheezing, no  rhonchi Abdominal: Soft, NT, ND, bowel sounds + Extremities: no edema, no cyanosis    The results of significant diagnostics from this hospitalization (including imaging, microbiology, ancillary and laboratory) are listed below for reference.     Microbiology: Recent Results (from the past 240 hour(s))  CSF culture     Status: None (Preliminary result)   Collection Time: 04/06/17  9:43 AM  Result Value Ref Range Status   Specimen Description CSF  Final   Special Requests NONE  Final   Gram Stain   Final    CYTOSPIN SMEAR WBC PRESENT, PREDOMINANTLY MONONUCLEAR NO ORGANISMS SEEN    Culture NO GROWTH 2 DAYS  Final   Report Status PENDING  Incomplete  Fungus Stain     Status: None   Collection Time: 04/06/17  9:43 AM  Result Value Ref Range Status   FUNGUS STAIN Final report  Final    Comment: (NOTE) Performed At: [facility] [place], [place] [id number] Rush Farmer MD EX:[id number]    Fungal Source CSF  Corrected  Fungal Stain reflex     Status: None   Collection Time: 04/06/17  9:43 AM  Result Value Ref Range Status   Fungal stain result 1 Comment  Final    Comment: (NOTE) KOH/Calcofluor preparation:  no fungus observed. Performed At: [facility] [place], [place] [id number] Rush Farmer MD YQ:[id number]      Labs: BNP (last 3 results) No results for input(s): BNP in the last  8760 hours. Basic Metabolic Panel: Recent Labs  Lab 04/04/17 0759 04/04/17 2112 04/05/17 0411 04/06/17 1029 04/08/17 1126  NA 139  --  139 137 139  K 3.7  --  3.8 4.7 3.9  CL 104  --  105 108 108  CO2 25  --  24 20* 24  GLUCOSE 107*  --  95 97 100*  BUN 11  --  9 10 9   CREATININE 0.77  --  0.82 0.84 0.71  CALCIUM 8.9  --  8.9 8.6* 9.0  MG  --  1.6*  --   --   --    Liver Function Tests: Recent Labs  Lab 04/04/17 0759 04/05/17 0411 04/06/17 1029 04/08/17 1126  AST 91* 94* 106* 106*  ALT 133* 134* 135* 157*  ALKPHOS 70 70 64  76  BILITOT 0.9 0.7 1.3* 0.6  PROT 7.1 6.8 6.3* 6.6  ALBUMIN 3.4* 3.3* 3.3* 3.3*   No results for input(s): LIPASE, AMYLASE in the last 168 hours. Recent Labs  Lab 04/04/17 2100 04/06/17 1029 04/08/17 1126  AMMONIA 38* 45* 34   CBC: Recent Labs  Lab 04/04/17 0759 04/05/17 0411 04/08/17 1126  WBC 9.2 8.7 8.4  NEUTROABS 5.6  --   --   HGB 15.0 14.5 14.8  HCT 44.0 42.2 41.9  MCV 85.1 85.3 84.5  PLT 160 154 152   Cardiac Enzymes: Recent Labs  Lab 04/06/17 1253  CKTOTAL 114   BNP: Invalid input(s): POCBNP CBG: Recent Labs  Lab 04/04/17 0807  GLUCAP 102*   D-Dimer No results for input(s): DDIMER in the last 72 hours. Hgb A1c No results for input(s): HGBA1C in the last 72 hours. Lipid Profile No results for input(s): CHOL, HDL, LDLCALC, TRIG, CHOLHDL, LDLDIRECT in the last 72 hours. Thyroid function studies No results for input(s): TSH, T4TOTAL, T3FREE, THYROIDAB in the last 72 hours.  Invalid input(s): FREET3 Anemia work up No results for input(s): VITAMINB12, FOLATE, FERRITIN, TIBC, IRON, RETICCTPCT in the last 72 hours. Urinalysis    Component Value Date/Time   COLORURINE YELLOW 04/04/2017 0832   APPEARANCEUR CLEAR 04/04/2017 0832   LABSPEC 1.017 04/04/2017 0832   PHURINE 6.0 04/04/2017 0832   GLUCOSEU NEGATIVE 04/04/2017 0832   HGBUR SMALL (A) 04/04/2017 0832   BILIRUBINUR NEGATIVE 04/04/2017 0832   KETONESUR NEGATIVE 04/04/2017 0832   PROTEINUR 100 (A) 04/04/2017 0832   UROBILINOGEN 0.2 10/17/2014 0350   NITRITE NEGATIVE 04/04/2017 0832   LEUKOCYTESUR NEGATIVE 04/04/2017 0832   Sepsis Labs Invalid input(s): PROCALCITONIN,  WBC,  LACTICIDVEN Microbiology Recent Results (from the past 240 hour(s))  CSF culture     Status: None (Preliminary result)   Collection Time: 04/06/17  9:43 AM  Result Value Ref Range Status   Specimen Description CSF  Final   Special Requests NONE  Final   Gram Stain   Final    CYTOSPIN SMEAR WBC PRESENT, PREDOMINANTLY  MONONUCLEAR NO ORGANISMS SEEN    Culture NO GROWTH 2 DAYS  Final   Report Status PENDING  Incomplete  Fungus Stain     Status: None   Collection Time: 04/06/17  9:43 AM  Result Value Ref Range Status   FUNGUS STAIN Final report  Final    Comment: (NOTE) Performed At: [facility] [place], [place] [id number] Rush Farmer MD [id number]    Fungal Source CSF  Corrected  Fungal Stain reflex     Status: None   Collection Time: 04/06/17  9:43 AM  Result Value  Ref Range Status   Fungal stain result 1 Comment  Final    Comment: (NOTE) KOH/Calcofluor preparation:  no fungus observed. Performed At: [facility] [place], [place] [id number] Rush Farmer MD FO:[id number]      Time coordinating discharge: Over 30 minutes  SIGNED:   Yaakov Guthrie MD  [facility] 04/08/2017, 3:14 PM Pager [phone]  If 7PM-7AM, please contact night-coverage [email or web address] Password [email or web address]

## 2017-04-08 NOTE — Care Management Note (Signed)
Case Management Note  Patient Details  Name: Clayton Jensen MRN: [id number] Date of Birth: 1981/03/18  Subjective/Objective:                   Action/Plan: Pt discharging home with self care. Pt has PCP, insurance and transportation home. No further needs per CM.  Expected Discharge Date:  04/08/17               Expected Discharge Plan:  Home/Self Care  In-House Referral:     Discharge planning Services     Post Acute Care Choice:    Choice offered to:     DME Arranged:    DME Agency:     HH Arranged:    HH Agency:     Status of Service:  Completed, signed off  If discussed at [employer] Length of Stay Meetings, dates discussed:    Additional Comments:  Kermit BaloKelli F Phelicia Dantes, RN 04/08/2017, 4:10 PM

## 2017-04-08 NOTE — Progress Notes (Signed)
[facility]  Date of Admission:  04/04/2017     Total days of antibiotics 2  Acyclovir           Principal Problem:   Meningoencephalitis Active Problems:   Encephalopathy   Abnormal liver function   Tremor, essential   . enoxaparin (LOVENOX) injection  80 mg Subcutaneous Q24H  . nicotine  21 mg Transdermal Daily  . sodium chloride flush  3 mL Intravenous Q12H    SUBJECTIVE: Reports to be feeling much better today and nearly back to normal physically. Mentally still a little 'foggy' but very happy about improvement. Baseline tremor feels back to normal quality per his and his wife's account. Has been walking in the hallway and performing more ADLs on his own without needing as much balance support. Wondering when he can go home and when to return to work.   Review of Systems: Review of Systems  Constitutional: Negative for chills and fever.  HENT: Negative for tinnitus.   Eyes: Negative for blurred vision and photophobia.  Respiratory: Negative for cough and sputum production.   Cardiovascular: Negative for chest pain.  Gastrointestinal: Negative for diarrhea, nausea and vomiting.  Genitourinary: Negative for dysuria.  Skin: Negative for rash.  Neurological: Positive for tremors. Negative for headaches.    No Known Allergies  OBJECTIVE: Vitals:   04/08/17 0029 04/08/17 0434 04/08/17 0756 04/08/17 1011  BP: (!) 109/51 (!) 131/43 120/69 (!) 110/57  Pulse: 91 70 87 73  Resp: 20 18 18 17   Temp: 97.8 F (36.6 C)  97.8 F (36.6 C) (!) 97.5 F (36.4 C)  TempSrc: Oral  Oral Oral  SpO2: 94% 96% 95% 94%  Weight:      Height:       Body mass index is 47.7 kg/m.  Physical Exam  Constitutional: He is oriented to person, place, and time and well-developed, well-nourished, and in no distress.  Seated up in bed with glasses on today eating breakfast laughing with his family. Independently organizing tray and feeding himself.   HENT:   Mouth/Throat: No oral lesions. Normal dentition. No dental caries.  Eyes: No scleral icterus.  Cardiovascular: Normal rate, regular rhythm and normal heart sounds.  Pulmonary/Chest: Effort normal and breath sounds normal.  Abdominal: Soft. He exhibits no distension. There is no tenderness.  Musculoskeletal: Normal range of motion.  Lymphadenopathy:    He has no cervical adenopathy.  Neurological: He is alert and oriented to person, place, and time. No cranial nerve deficit. Coordination normal.  Skin: Skin is warm and dry. No rash noted.  Psychiatric: Mood and affect normal.  Short term memory appears to be improving. Not searching for words like before.   Vitals reviewed.   Lab Results Lab Results  Component Value Date   WBC 8.7 04/05/2017   HGB 14.5 04/05/2017   HCT 42.2 04/05/2017   MCV 85.3 04/05/2017   PLT 154 04/05/2017    Lab Results  Component Value Date   CREATININE 0.84 04/06/2017   BUN 10 04/06/2017   NA 137 04/06/2017   K 4.7 04/06/2017   CL 108 04/06/2017   CO2 20 (L) 04/06/2017    Lab Results  Component Value Date   ALT 135 (H) 04/06/2017   AST 106 (H) 04/06/2017   ALKPHOS 64 04/06/2017   BILITOT 1.3 (H) 04/06/2017     Microbiology: Recent Results (from the past 240 hour(s))  CSF culture     Status:  None (Preliminary result)   Collection Time: 04/06/17  9:43 AM  Result Value Ref Range Status   Specimen Description CSF  Final   Special Requests NONE  Final   Gram Stain   Final    CYTOSPIN SMEAR WBC PRESENT, PREDOMINANTLY MONONUCLEAR NO ORGANISMS SEEN    Culture NO GROWTH 2 DAYS  Final   Report Status PENDING  Incomplete  Fungus Stain     Status: None   Collection Time: 04/06/17  9:43 AM  Result Value Ref Range Status   FUNGUS STAIN Final report  Final    Comment: (NOTE) Performed At: [facility] [address] [place], [place] [id number] Jolene SchimkeNagendra Sanjai MD JX:[id number]:[phone]    Fungal Source CSF  Corrected  Fungal Stain reflex      Status: None   Collection Time: 04/06/17  9:43 AM  Result Value Ref Range Status   Fungal stain result 1 Comment  Final    Comment: (NOTE) KOH/Calcofluor preparation:  no fungus observed. 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 HSV testing of cerebrospinal fluid is negative. Discussed these results with he and his sister and cousin.   PLAN: 1. Stop Acyclovir with negative HSV PCR  2. OK from our stand point to be discharged home when he and his family are comfortable with his physical abilities today or tomorrow  3. Discussed with he and his wife and will not pursue further potentially unyielding/exhaustive CSF testing as of now considering his significant improvement.  4. Regarding returning to work - we discussed that since his job relies heavily on cognitive functioning being he is a Merchandiser, retailmanager overseeing a team/company would recommend a week or two to see how he feels. If he feels better faster than anticipated would go off of what he believes he can do (part time vs full time, slow introduction back a few hours a few days a week vs resuming normal schedule).  5. Should have outpatient follow up regarding liver tests - recommended dietary changes and weight loss to help with this should fatty liver be a driving etiology.   He can follow up with his primary care team outpatient.   Rexene AlbertsStephanie Dixon, MSN, NP-C [facility] Cell: [phone] Pager: [phone]  04/08/2017  11:04 AM

## 2017-04-08 NOTE — Plan of Care (Signed)
Progressed to independence.

## 2017-04-08 NOTE — Progress Notes (Signed)
Stopped by to update family HSV negative. Clinically doing better- could name past presidents upto [place], serial subtractions 7 accurate upto 65.   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 D/C acyclovir unless ID wants it. No need for neurology follow up as outpatient, unless he still complains of memory problems > 1 month. Elevated LFTs likely from fatty liver, PCP to follow up on repeat testing.  Sleep Study

## 2017-04-09 LAB — CSF CULTURE W GRAM STAIN

## 2017-04-09 LAB — CSF CULTURE: CULTURE: NO GROWTH

## 2017-04-11 ENCOUNTER — Encounter: Payer: Self-pay | Admitting: Family Medicine

## 2017-04-11 ENCOUNTER — Ambulatory Visit: Payer: 59 | Admitting: Family Medicine

## 2017-04-11 VITALS — BP 110/72 | Temp 98.1°F | Ht 73.0 in | Wt 358.0 lb

## 2017-04-11 DIAGNOSIS — A86 Unspecified viral encephalitis: Secondary | ICD-10-CM | POA: Diagnosis not present

## 2017-04-11 LAB — ENTEROVIRUS PCR: ENTEROVIRUS PCR: NEGATIVE

## 2017-04-11 LAB — VDRL, CSF: SYPHILIS VDRL QUANT CSF: NONREACTIVE

## 2017-04-11 NOTE — Progress Notes (Signed)
   Subjective:    Patient ID: Clayton Jensen, male    DOB: 27-Jul-1980, 37 y.o.   MRN: [id number] Patient arrives for a very protracted discussion and assessment and evaluation following recent hospitalization   HPIHospitalization follow up. Pt states he is about the same. Still losing balance, joint pain, tremors, some confusion.   Complete hospital record all consult notes all results reviewed and read today in presence of patient and spouse.  Patient developed onset of confusion.  Rather rapid.  Was first seen in [facility].  Then [facility].  Patient had an extensive workup.  This included an LP with 500 white blood cells.  All cultures were negative.  Including viral.  Initial concern was regarding herpes encephalitis due to significant mental status changes.  Patient was appropriately treated until cultures returned negative  Patient reports ongoing weakness.  Although states better.  Patient reports ongoing mild fussiness thinking.  Spouse endorses this.  Although once again somewhat improved. Still feeling shaky.  Unsteady at times.  Able to walk but not completely sure of his footing.   Occasional headache.  Diffuse in nature.  No fevers.  Decent appetite.  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  Slowly improving.  Very long discussion held.   Definitely not ready to work as of yet.  Nature of disease as I understand it discussed at length.  Patient continues to recuperate.  Of course patient and family concerned about long-term sequela.  Possible to say at this time.  Referral for ongoing neuro/psychological assessment unsure would take months.  At this time I think reasonable to give this a week or 2 to see how recuperation goes.  Warning signs discussed carefully Recheck in 1 week  Greater than 50% of this 40 minute face to face visit was spent in counseling and discussion and coordination of care regarding the above diagnosis/diagnosies

## 2017-04-13 LAB — OLIGOCLONAL BANDS, CSF + SERM

## 2017-04-14 ENCOUNTER — Ambulatory Visit: Payer: 59 | Admitting: Family Medicine

## 2017-04-14 VITALS — Temp 97.9°F | Ht 73.0 in | Wt 359.4 lb

## 2017-04-14 DIAGNOSIS — A86 Unspecified viral encephalitis: Secondary | ICD-10-CM | POA: Diagnosis not present

## 2017-04-14 DIAGNOSIS — R5383 Other fatigue: Secondary | ICD-10-CM | POA: Diagnosis not present

## 2017-04-14 NOTE — Progress Notes (Signed)
   Subjective:    Patient ID: Clayton Jensen, male    DOB: 01-12-1981, 37 y.o.   MRN: [id number]  HPI  Patient arrives for a follow up from a recent hospitalization for encephalitis. Patient states he is not feeling well today.   Slurring words and mixing up sentences   Worse when really tired and worse whn     Head hurt really bad , wanted to lay down earlier today   Felt drowsier than usual   Patient returns with ongoing challenges.  See prior note.  Full hospital reviewed once again for patient.  Along with consult notes.  Patient and spouse concerned about ongoing symptomatology.  Also worried about it.  Having intermittent periods of confusion.  Generally around the time of sleep are immediately waking up from sleep.  Has had a little is not likely but much more since the event.  Also headaches off and on.  Diffuse nature.  Patient not taking medication for  Patient has gone back to some regular activities.  Worked in the garage lately     Review of Systems No headache, no major weight loss or weight gain, no chest pain no back pain abdominal pain no change in bowel habits complete ROS otherwise negative     Objective:   Physical Exam Alert and oriented, vitals reviewed and stable, NAD ENT-TM's and ext canals WNL bilat via otoscopic exam Soft palate, tonsils and post pharynx WNL via oropharyngeal exam Neck-symmetric, no masses; thyroid nonpalpable and nontender Pulmonary-no tachypnea or accessory muscle use; Clear without wheezes via auscultation Card--no abnrml murmurs, rhythm reg and rate WNL Carotid pulses symmetric, without bruits        Assessment & Plan:  Impression resolving encephalitis/meningitis.  Likely viral.  I think patient continues on the past towards improvement.  Long discussion held.  I really wishes I told the family I could have for specialist in the room as I checked the patient but this is simply not possible.  My best judgment is a patient  does not need to go back to the emergency room for full workup this evening.  That his course of improvement is consistent with the stated diagnosis.  I am hopeful for ongoing weekly improvement.  Follow-up next week as discussed  Greater than 50% of this 25 minute face to face visit was spent in counseling and discussion and coordination of care regarding the above diagnosis/diagnosies

## 2017-04-18 ENCOUNTER — Encounter: Payer: Self-pay | Admitting: Family Medicine

## 2017-04-18 ENCOUNTER — Ambulatory Visit: Payer: 59 | Admitting: Family Medicine

## 2017-04-18 VITALS — BP 120/76 | Ht 73.0 in | Wt 359.0 lb

## 2017-04-18 DIAGNOSIS — A86 Unspecified viral encephalitis: Secondary | ICD-10-CM | POA: Diagnosis not present

## 2017-04-18 NOTE — Progress Notes (Signed)
   Subjective:    Patient ID: Arlyce HarmanMichael H Pressey, male    DOB: 08-13-1980, 37 y.o.   MRN: [id number]  HPI  Patient is here today for a one week follow up to recheck viral encephalitis.He states he is feeling better.  Slept better, improved energy  Stayed up too late fri  Dragging on saturd  Took motrin for the headache   dim energy   Has been more clear, no major confusion, no difficulty with hypnagogic issues  fri went to wrlk, hung aout a bit, felt overall better    No slurring of speech this weekend, had some nausea                                                                                                                                                                                                                                                                                                              Manage a glass store   [employer]       Review of Systems No headache, no major weight loss or weight gain, no chest pain no back pain abdominal pain no change in bowel habits complete ROS otherwise negative     Objective:   Physical Exam  Alert vitals stable, NAD. Blood pressure good on repeat. HEENT normal. Lungs clear. Heart regular rate and rhythm.       Assessment & Plan:  Status post encephalitis admission.  Continue patient continues to improve.  Discussed.  Will return to part-time work status later in the week.  Wife to watch close.  Gradual reintroduction usual duties including driving.  Patient states feels fine and I think substantially better than just 1 week ago

## 2017-08-26 ENCOUNTER — Telehealth: Payer: Self-pay

## 2017-08-26 NOTE — Telephone Encounter (Signed)
Patient wife called today to see if we could work in the pt today he has pulled something in his right rib and it hurts to breath.They wanted to see if we could do an xray. I advised that they be evaluated by urgent care or ed as we have no availability today. They state understanding.

## 2017-08-26 NOTE — Telephone Encounter (Signed)
Addendum :He states he was seen by a chiropractor this am and they had mentioned to them to go to pcp. I advised the information below to go to the ed or urgent care to evaluate, they state understanding.

## 2017-08-29 ENCOUNTER — Other Ambulatory Visit: Payer: Self-pay

## 2017-08-29 ENCOUNTER — Encounter (HOSPITAL_COMMUNITY): Payer: Self-pay | Admitting: Emergency Medicine

## 2017-08-29 ENCOUNTER — Emergency Department (HOSPITAL_COMMUNITY): Payer: 59

## 2017-08-29 ENCOUNTER — Inpatient Hospital Stay (HOSPITAL_COMMUNITY)
Admission: EM | Admit: 2017-08-29 | Discharge: 2017-09-05 | DRG: 853 | Disposition: A | Discharge status: Home Health | Payer: 59 | Attending: Surgery | Admitting: Surgery

## 2017-08-29 DIAGNOSIS — J189 Pneumonia, unspecified organism: Secondary | ICD-10-CM

## 2017-08-29 DIAGNOSIS — Z91018 Allergy to other foods: Secondary | ICD-10-CM | POA: Diagnosis not present

## 2017-08-29 DIAGNOSIS — R739 Hyperglycemia, unspecified: Secondary | ICD-10-CM | POA: Diagnosis not present

## 2017-08-29 DIAGNOSIS — J90 Pleural effusion, not elsewhere classified: Secondary | ICD-10-CM | POA: Diagnosis present

## 2017-08-29 DIAGNOSIS — Z79899 Other long term (current) drug therapy: Secondary | ICD-10-CM | POA: Diagnosis not present

## 2017-08-29 DIAGNOSIS — K089 Disorder of teeth and supporting structures, unspecified: Secondary | ICD-10-CM

## 2017-08-29 DIAGNOSIS — G8929 Other chronic pain: Secondary | ICD-10-CM | POA: Diagnosis present

## 2017-08-29 DIAGNOSIS — E662 Morbid (severe) obesity with alveolar hypoventilation: Secondary | ICD-10-CM | POA: Diagnosis not present

## 2017-08-29 DIAGNOSIS — F17210 Nicotine dependence, cigarettes, uncomplicated: Secondary | ICD-10-CM | POA: Diagnosis present

## 2017-08-29 DIAGNOSIS — G4733 Obstructive sleep apnea (adult) (pediatric): Secondary | ICD-10-CM | POA: Diagnosis present

## 2017-08-29 DIAGNOSIS — Z8661 Personal history of infections of the central nervous system: Secondary | ICD-10-CM | POA: Diagnosis not present

## 2017-08-29 DIAGNOSIS — Z87442 Personal history of urinary calculi: Secondary | ICD-10-CM | POA: Diagnosis not present

## 2017-08-29 DIAGNOSIS — Z6841 Body Mass Index (BMI) 40.0 and over, adult: Secondary | ICD-10-CM

## 2017-08-29 DIAGNOSIS — Z825 Family history of asthma and other chronic lower respiratory diseases: Secondary | ICD-10-CM | POA: Diagnosis not present

## 2017-08-29 DIAGNOSIS — Z8709 Personal history of other diseases of the respiratory system: Secondary | ICD-10-CM | POA: Diagnosis present

## 2017-08-29 DIAGNOSIS — M545 Low back pain: Secondary | ICD-10-CM | POA: Diagnosis present

## 2017-08-29 DIAGNOSIS — J154 Pneumonia due to other streptococci: Secondary | ICD-10-CM | POA: Diagnosis present

## 2017-08-29 DIAGNOSIS — A419 Sepsis, unspecified organism: Secondary | ICD-10-CM | POA: Diagnosis present

## 2017-08-29 DIAGNOSIS — Z0189 Encounter for other specified special examinations: Secondary | ICD-10-CM

## 2017-08-29 DIAGNOSIS — Z01818 Encounter for other preprocedural examination: Secondary | ICD-10-CM

## 2017-08-29 DIAGNOSIS — J869 Pyothorax without fistula: Secondary | ICD-10-CM | POA: Diagnosis present

## 2017-08-29 DIAGNOSIS — Z8619 Personal history of other infectious and parasitic diseases: Secondary | ICD-10-CM

## 2017-08-29 HISTORY — DX: Pneumonia, unspecified organism: J18.9

## 2017-08-29 HISTORY — DX: Sepsis, unspecified organism: A41.9

## 2017-08-29 HISTORY — DX: Pleural effusion, not elsewhere classified: J90

## 2017-08-29 LAB — CBC WITH DIFFERENTIAL/PLATELET
Basophils Absolute: 0 10*3/uL (ref 0.0–0.1)
Basophils Relative: 0 %
Eosinophils Absolute: 0 10*3/uL (ref 0.0–0.7)
Eosinophils Relative: 0 %
HEMATOCRIT: 39.8 % (ref 39.0–52.0)
HEMOGLOBIN: 14 g/dL (ref 13.0–17.0)
LYMPHS ABS: 1.9 10*3/uL (ref 0.7–4.0)
LYMPHS PCT: 9 %
MCH: 30 pg (ref 26.0–34.0)
MCHC: 35.2 g/dL (ref 30.0–36.0)
MCV: 85.4 fL (ref 78.0–100.0)
MONOS PCT: 9 %
Monocytes Absolute: 2.1 10*3/uL — ABNORMAL HIGH (ref 0.1–1.0)
NEUTROS ABS: 17.9 10*3/uL — AB (ref 1.7–7.7)
NEUTROS PCT: 82 %
Platelets: 224 10*3/uL (ref 150–400)
RBC: 4.66 MIL/uL (ref 4.22–5.81)
RDW: 13.1 % (ref 11.5–15.5)
WBC: 21.9 10*3/uL — AB (ref 4.0–10.5)

## 2017-08-29 LAB — COMPREHENSIVE METABOLIC PANEL
ALK PHOS: 82 U/L (ref 38–126)
ALT: 22 U/L (ref 17–63)
ANION GAP: 8 (ref 5–15)
AST: 17 U/L (ref 15–41)
Albumin: 2.7 g/dL — ABNORMAL LOW (ref 3.5–5.0)
BILIRUBIN TOTAL: 0.8 mg/dL (ref 0.3–1.2)
BUN: 17 mg/dL (ref 6–20)
CALCIUM: 8.6 mg/dL — AB (ref 8.9–10.3)
CO2: 24 mmol/L (ref 22–32)
CREATININE: 0.83 mg/dL (ref 0.61–1.24)
Chloride: 106 mmol/L (ref 101–111)
GFR calc non Af Amer: >60 mL/min (ref >60)
GLUCOSE: 125 mg/dL — AB (ref 65–99)
Potassium: 4.3 mmol/L (ref 3.5–5.1)
SODIUM: 138 mmol/L (ref 135–145)
TOTAL PROTEIN: 6.7 g/dL (ref 6.5–8.1)

## 2017-08-29 LAB — PROCALCITONIN: Procalcitonin: 0.46 ng/mL

## 2017-08-29 LAB — TROPONIN I: Troponin I: 0.05 ng/mL (ref <0.03)

## 2017-08-29 LAB — BRAIN NATRIURETIC PEPTIDE: B Natriuretic Peptide: 143 pg/mL — ABNORMAL HIGH (ref 0.0–100.0)

## 2017-08-29 LAB — I-STAT CG4 LACTIC ACID, ED: Lactic Acid, Venous: 1.12 mmol/L (ref 0.5–1.9)

## 2017-08-29 MED ORDER — IOPAMIDOL (ISOVUE-370) INJECTION 76%
100.0000 mL | Freq: Once | INTRAVENOUS | Status: AC | PRN
  Administered 2017-08-29: 100 mL via INTRAVENOUS

## 2017-08-29 MED ORDER — VANCOMYCIN HCL IN DEXTROSE 1-5 GM/200ML-% IV SOLN
1000.0000 mg | Freq: Once | INTRAVENOUS | Status: AC
  Administered 2017-08-30: 1500 mg via INTRAVENOUS

## 2017-08-29 MED ORDER — VANCOMYCIN HCL 10 G IV SOLR
1500.0000 mg | Freq: Three times a day (TID) | INTRAVENOUS | Status: DC
  Administered 2017-08-30 – 2017-08-31 (×4): 1500 mg via INTRAVENOUS
  Filled 2017-08-29 (×10): qty 1500

## 2017-08-29 MED ORDER — ACETAMINOPHEN 325 MG PO TABS
650.0000 mg | ORAL_TABLET | Freq: Four times a day (QID) | ORAL | Status: DC | PRN
  Administered 2017-08-30: 650 mg via ORAL
  Filled 2017-08-29: qty 2

## 2017-08-29 MED ORDER — TRAMADOL HCL 50 MG PO TABS
50.0000 mg | ORAL_TABLET | Freq: Four times a day (QID) | ORAL | Status: DC | PRN
  Administered 2017-08-29 – 2017-08-30 (×2): 50 mg via ORAL
  Filled 2017-08-29 (×2): qty 1

## 2017-08-29 MED ORDER — SODIUM CHLORIDE 0.9 % IV SOLN
1.0000 g | INTRAVENOUS | Status: DC
  Administered 2017-08-29: 1 g via INTRAVENOUS
  Filled 2017-08-29 (×2): qty 10

## 2017-08-29 MED ORDER — PIPERACILLIN-TAZOBACTAM 3.375 G IVPB 30 MIN
3.3750 g | Freq: Once | INTRAVENOUS | Status: AC
  Administered 2017-08-29: 3.375 g via INTRAVENOUS
  Filled 2017-08-29: qty 50

## 2017-08-29 MED ORDER — VANCOMYCIN HCL IN DEXTROSE 1-5 GM/200ML-% IV SOLN
1000.0000 mg | Freq: Once | INTRAVENOUS | Status: AC
  Administered 2017-08-29: 1000 mg via INTRAVENOUS
  Filled 2017-08-29: qty 200

## 2017-08-29 MED ORDER — SODIUM CHLORIDE 0.9 % IV SOLN: INTRAVENOUS | Status: DC

## 2017-08-29 MED ORDER — SODIUM CHLORIDE 0.9 % IV BOLUS
1000.0000 mL | Freq: Once | INTRAVENOUS | Status: AC
  Administered 2017-08-29: 1000 mL via INTRAVENOUS

## 2017-08-29 MED ORDER — SODIUM CHLORIDE 0.9 % IV SOLN
500.0000 mg | INTRAVENOUS | Status: DC
  Administered 2017-08-30: 500 mg via INTRAVENOUS
  Filled 2017-08-29 (×2): qty 500

## 2017-08-29 MED ORDER — OXYCODONE HCL 5 MG PO TABS: 5.0000 mg | ORAL_TABLET | Freq: Four times a day (QID) | ORAL | Status: DC | PRN

## 2017-08-29 NOTE — ED Notes (Signed)
[facility] here to transport  

## 2017-08-29 NOTE — ED Provider Notes (Signed)
[facility] EMERGENCY DEPARTMENT Provider Note   CSN: [id number] Arrival date & time: 08/29/17  1403     History   Chief Complaint Chief Complaint  Patient presents with  . Shortness of Breath    HPI Clayton Jensen is a 37 y.o. male.  HPI 4 days ago patient had acute onset right-sided chest pain.  No known trauma.  States the pain is worse with deep breathing or coughing.  Was seen in the urgent care the day after and had x-rays without acute findings.  Was treated with prednisone and NSAIDs.  States he has had progressive pain, fever, shaking chills and nonproductive cough.  Has chronic bilateral lower extremity swelling which is unchanged.  Denies any recent extended travel or immobilization.  Was seen in the urgent care today and had a repeat x-ray performed.  Demonstrated right-sided pleural effusion.  Advised to go to the emergency department. Past Medical History:  Diagnosis Date  . Chronic lower back pain    "fell off roof in ~ 2003; disc fused itself"  . History of kidney stones   . OSA (obstructive sleep apnea)    clinical diagnosis, not officially diagnosed per family    Patient Active Problem List   Diagnosis Date Noted  . Tremor, essential 04/08/2017  . Encephalopathy   . 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 Abnormal liver function   . Ataxia   . Altered mental state 04/04/2017    Past Surgical History:  Procedure Laterality Date  . KNEE ARTHROSCOPY Left         Home Medications    Prior to Admission medications   Medication Sig Start Date End Date Taking? Authorizing Provider  acetaminophen (TYLENOL) 500 MG tablet Take 500 mg by mouth every 6 (six) hours as needed for mild pain.   Yes [provider]  cetirizine (ZYRTEC) 10 MG tablet Take 10 mg by mouth once as needed for allergies.   Yes [provider]  ketorolac (TORADOL) 10 MG tablet Take 10 mg by mouth every 6 (six) hours as needed for moderate pain.   Yes [provider]   methocarbamol (ROBAXIN) 500 MG tablet Take 500 mg by mouth every 6 (six) hours as needed for muscle spasms.   Yes [provider]  predniSONE (DELTASONE) 10 MG tablet Take 10 mg by mouth 2 (two) times daily with a meal. 5 day course   Yes [provider]    Family History Family History  Problem Relation Age of Onset  . COPD Mother   . Atrial fibrillation Mother   . Diabetes Mother   . Diabetes Father     Social History Social History   Tobacco Use  . Smoking status: Current Every Day Smoker    Packs/day: 0.75    Years: 23.00    Pack years: 17.25    Types: Cigarettes  . Smokeless tobacco: Never Used  Substance Use Topics  . Alcohol use: Yes    Frequency: Never    Comment: 04/05/2017 "might have a beer q 2 months"  . Drug use: No     Allergies   Celery oil   Review of Systems Review of Systems  Constitutional: Positive for chills and fever.  HENT: Negative for congestion, sinus pressure and sore throat.   Eyes: Negative for visual disturbance.  Respiratory: Positive for cough and shortness of breath.   Cardiovascular: Positive for chest pain. Negative for palpitations and leg swelling.  Gastrointestinal: Negative for abdominal pain, constipation, diarrhea, nausea and  vomiting.  Genitourinary: Negative for flank pain and frequency.  Musculoskeletal: Negative for back pain, neck pain and neck stiffness.  Skin: Negative for rash and wound.  Neurological: Negative for dizziness, syncope, weakness, light-headedness, numbness and headaches.  All other systems reviewed and are negative.    Physical Exam Updated Vital Signs BP (!) 101/52   Pulse 97   Temp 100 F (37.8 C) (Oral)   Resp (!) 28   Ht 6' (1.829 m)   Wt (!) 158.8 kg (350 lb)   SpO2 95%   BMI 47.47 kg/m   Physical Exam  Constitutional: He is oriented to person, place, and time. He appears well-developed and well-nourished.  HENT:  Head: Normocephalic and atraumatic.  Mouth/Throat:  Oropharynx is clear and moist. No oropharyngeal exudate.  Eyes: Pupils are equal, round, and reactive to light. EOM are normal.  Neck: Normal range of motion. Neck supple. No JVD present.  Cardiovascular: Regular rhythm. Exam reveals no gallop and no friction rub.  No murmur heard. Tachycardia.  Pulmonary/Chest: Effort normal.  Diminished right lung field breath sounds especially in the base.  Abdominal: Soft. Bowel sounds are normal. There is no tenderness. There is no rebound and no guarding.  Musculoskeletal: Normal range of motion. He exhibits edema. He exhibits no tenderness.  Bilateral lower extremity edema left slightly greater than right.  Distal pulses intact.  Lymphadenopathy:    He has no cervical adenopathy.  Neurological: He is alert and oriented to person, place, and time.  Moves all extremities without focal deficit.  Sensation fully intact.  Skin: Skin is warm. Capillary refill takes less than 2 seconds. No rash noted. He is diaphoretic. No erythema.  Psychiatric: He has a normal mood and affect. His behavior is normal.  Nursing note and vitals reviewed.    ED Treatments / Results  Labs (all labs ordered are listed, but only abnormal results are displayed) Labs Reviewed  CBC WITH DIFFERENTIAL/PLATELET - Abnormal; Notable for the following components:      Result Value   WBC 21.9 (*)    Neutro Abs 17.9 (*)    Monocytes Absolute 2.1 (*)    All other components within normal limits  COMPREHENSIVE METABOLIC PANEL - Abnormal; Notable for the following components:   Glucose, Bld 125 (*)    Calcium 8.6 (*)    Albumin 2.7 (*)    All other components within normal limits  BRAIN NATRIURETIC PEPTIDE - Abnormal; Notable for the following components:   B Natriuretic Peptide 143.0 (*)    All other components within normal limits  TROPONIN I - Abnormal; Notable for the following components:   Troponin I 0.05 (*)    All other components within normal limits  CULTURE, BLOOD  (ROUTINE X 2)  CULTURE, BLOOD (ROUTINE X 2)  I-STAT CG4 LACTIC ACID, ED    EKG EKG Interpretation  Date/Time:  Monday Aug 29 2017 14:22:57 EDT Ventricular Rate:  107 PR Interval:    QRS Duration: 90 QT Interval:  317 QTC Calculation: 423 R Axis:   33 Text Interpretation:  Sinus tachycardia Confirmed by Loren Racer ([id number]) on 08/29/2017 3:07:22 PM   Radiology Ct Angio Chest Pe W And/or Wo Contrast  Result Date: 08/29/2017 CLINICAL DATA:  Worsening chest x-ray, EXAM: CT ANGIOGRAPHY CHEST WITH CONTRAST TECHNIQUE: Multidetector CT imaging of the chest was performed using the standard protocol during bolus administration of intravenous contrast. Multiplanar CT image reconstructions and MIPs were obtained to evaluate the vascular anatomy. CONTRAST:  ISOVUE-370  IOPAMIDOL (ISOVUE-370) INJECTION 76% COMPARISON:  None. FINDINGS: Cardiovascular: Suboptimal opacification of the pulmonary arteries. No large central pulmonary embolus, but evaluation of the lobar and segmental branches is limited secondary to suboptimal contrast opacification. Normal heart size. No pericardial effusion. Normal caliber thoracic aorta. Mediastinum/Nodes: No enlarged mediastinal, hilar, or axillary lymph nodes. Thyroid gland, trachea, and esophagus demonstrate no significant findings. Lungs/Pleura: Large partial loculated right pleural effusion with compressive atelectasis with only a small area of aerated lung in the upper chest. Left lung is clear. No pneumothorax. Upper Abdomen: No acute upper abdominal abnormality. Low attenuation of the liver as can be seen with hepatic steatosis. Musculoskeletal: No acute osseous abnormality. No aggressive osseous lesion.Broad-based disc osteophyte complex at T11-12. Review of the MIP images confirms the above findings. IMPRESSION: 1. Suboptimal opacification of the pulmonary arteries. No large central pulmonary embolus, but evaluation of the lobar and segmental branches is  limited secondary to suboptimal contrast opacification. 2. Large partial loculated right pleural effusion with compressive atelectasis with only a small area of aerated lung in the upper chest. Electronically Signed   By: Elige Ko   On: 08/29/2017 16:40    Procedures Procedures (including critical care time)  Medications Ordered in ED Medications  vancomycin (VANCOCIN) IVPB 1000 mg/200 mL premix (1,000 mg Intravenous New Bag/Given 08/29/17 1720)  sodium chloride 0.9 % bolus 1,000 mL (has no administration in time range)  iopamidol (ISOVUE-370) 76 % injection 100 mL (100 mLs Intravenous Contrast Given 08/29/17 1611)  piperacillin-tazobactam (ZOSYN) IVPB 3.375 g (0 g Intravenous Stopped 08/29/17 1720)  sodium chloride 0.9 % bolus 1,000 mL (1,000 mLs Intravenous New Bag/Given 08/29/17 1640)     Initial Impression / Assessment and Plan / ED Course  I have reviewed the triage vital signs and the nursing notes.  Pertinent labs & imaging results that were available during my care of the patient were reviewed by me and considered in my medical decision making (see chart for details).    Patient with elevation in white blood cell count.  CT with evidence of loculated right-sided pleural effusion but no definite evidence of PE.  Blood cultures sent.  Normal lactic acid.  Started on broad-spectrum antibiotics.  Discussed with Dr. Virgel Gess who will see the patient and consult once he is transferred to [facility].  Dr. Onalee Hua hospitalist will see patient in the emergency department and transfer. Patient has mild elevation in troponin of uncertain significance.  No definite evidence of ischemia on EKG.  Will need troponins trended.   Final Clinical Impressions(s) / ED Diagnoses   Final diagnoses:  Loculated pleural effusion    ED Discharge Orders    None       Loren Racer, MD 08/29/17 [phone]

## 2017-08-29 NOTE — ED Notes (Signed)
MD at bedside. 

## 2017-08-29 NOTE — ED Notes (Signed)
Have given report to [facility]  

## 2017-08-29 NOTE — Progress Notes (Signed)
Pharmacy Antibiotic Note  Clayton Jensen is a 37 y.o. male admitted on 08/29/2017 with pneumonia.  Pharmacy has been consulted for Vancomycin dosing.  Plan: Vancomycing 2gm IV x 1 Vancomycin 1.5gm IV every 8 hours.  Goal trough 15-20 mcg/mL.  Monitor labs, micro and vitals.   Height: 6' (182.9 cm) Weight: (!) 350 lb (158.8 kg) IBW/kg (Calculated) : 77.6  Temp (24hrs), Avg:100 F (37.8 C), Min:100 F (37.8 C), Max:100 F (37.8 C)  Recent Labs  Lab 08/29/17 1505 08/29/17 1513  WBC 21.9*  --   CREATININE 0.83  --   LATICACIDVEN  --  1.12    Estimated Creatinine Clearance: 187.9 mL/min (by C-G formula based on SCr of 0.83 mg/dL).    Allergies  Allergen Reactions  . Celery Oil Shortness Of Breath and Swelling    Swelling of throat   Antimicrobials this admission: Vancomycin 5/27 >>  Rocephin 5/27 >>  Azith 5/27 >>  Dose adjustments this admission: Obesity/Normalized CrCl Vancomycin dosing protocol will be initiated with an estimated normalized CrCl = 122 ml/min.  Microbiology results: 5/2y BCx: pending  UCx:    Sputum:    MRSA PCR:   Thank you for allowing pharmacy to be a part of this patient's care.  [place], [place] 08/29/2017 6:42 PM

## 2017-08-29 NOTE — ED Notes (Signed)
CRITICAL VALUE ALERT  Critical Value: Troponin 0.05   Date & Time Notied:  08/29/2017   Provider Notified: Notified Dr. Ranae Palms.   Orders Received/Actions taken: No orders yet

## 2017-08-29 NOTE — ED Triage Notes (Signed)
Patient states he went to Urgent Care in [place] and was told he needed to go straight to the hospital for "worsening chest x ray." Paperwork from Urgent Care states possible pneumonia, pleural effusion.

## 2017-08-29 NOTE — H&P (Signed)
History and Physical    Clayton Jensen JXB:[id number] DOB: 1980/09/03 DOA: 08/29/2017  PCP: Merlyn Albert, MD  Patient coming from: Home  Chief Complaint: Right-sided chest pain and shortness of breath and fever  HPI: Clayton Jensen is a 37 y.o. 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  Patient reports that it started about 4 days ago when he was rocking his baby when he turned and pulled a muscle in his right side.  He immediately heard a pop and it was extremely painful and had difficulty breathing due to the pain.  The following day he went to urgent care at which point he had a chest x-ray done which was normal and was told that he had costochondritis and was started on NSAIDs and prednisone.  He went home continue to have a lot of pain and continued to have difficulty in breathing due to the pain.  Yesterday he started spiking fevers.  They went back to the urgent care today because of the fevers he had a repeat chest x-ray which showed a large right-sided effusion and he was told to go immediately to the emergency department.  He has a CT scan done today which shows a partially loculated large right-sided pleural effusion.  CT surgery at [facility] is been called who are recommending patient go to the OR to have this drained tonight or in the morning.  He denies any nausea vomiting diarrhea.  Patient given vancomycin and Zosyn in the ED and referred for admission for surgical intervention at [facility].   Review of Systems: As per HPI otherwise 10 point review of systems negative.   Past Medical History:  Diagnosis Date  . Chronic lower back pain    "fell off roof in ~ 2003; disc fused itself"  . History of kidney stones   . OSA (obstructive sleep apnea)    clinical diagnosis, not officially  diagnosed per family    Past Surgical History:  Procedure Laterality Date  . KNEE ARTHROSCOPY Left      reports that he has been smoking cigarettes.  He has a 17.25 pack-year smoking history. He has never used smokeless tobacco. He reports that he drinks alcohol. He reports that he does not use drugs.  Allergies  Allergen Reactions  . Celery Oil Shortness Of Breath and Swelling    Swelling of throat    Family History  Problem Relation Age of Onset  . COPD Mother   . Atrial fibrillation Mother   . Diabetes Mother   . Diabetes Father     Prior to Admission medications   Medication Sig Start Date End Date Taking? Authorizing Provider  acetaminophen (TYLENOL) 500 MG tablet Take 500 mg by mouth every 6 (six) hours as needed for mild pain.   Yes [provider]  cetirizine (ZYRTEC) 10 MG tablet Take 10 mg by mouth once as needed for allergies.   Yes [provider]  ketorolac (TORADOL) 10 MG tablet Take 10 mg by mouth every 6 (six) hours as needed for moderate pain.   Yes [provider]  methocarbamol (ROBAXIN) 500 MG tablet Take 500 mg by mouth every 6 (six) hours as needed for muscle spasms.   Yes [provider]  predniSONE (DELTASONE) 10 MG tablet Take 10 mg by mouth  2 (two) times daily with a meal. 5 day course   Yes [provider]    Physical Exam: Vitals:   08/29/17 1630 08/29/17 1700 08/29/17 1730 08/29/17 1800  BP: 111/61 (!) 101/52 (!) 98/49 116/61  Pulse: 98 97 97 98  Resp: (!) 28 (!) 28 (!) 22 (!) 34  Temp:      TempSrc:      SpO2: 94% 95% 92% 95%  Weight:      Height:        Constitutional: NAD, calm, comfortable Vitals:   08/29/17 1630 08/29/17 1700 08/29/17 1730 08/29/17 1800  BP: 111/61 (!) 101/52 (!) 98/49 116/61  Pulse: 98 97 97 98  Resp: (!) 28 (!) 28 (!) 22 (!) 34  Temp:      TempSrc:      SpO2: 94% 95% 92% 95%  Weight:      Height:       Eyes: PERRL, lids and conjunctivae normal ENMT: Mucous  membranes are moist. Posterior pharynx clear of any exudate or lesions.Normal dentition.  Neck: normal, supple, no masses, no thyromegaly Respiratory: clear to auscultation bilaterally on the left and diminished on the right, no wheezing, no crackles. Normal respiratory effort. No accessory muscle use.  Cardiovascular: Regular rate and rhythm, no murmurs / rubs / gallops. No extremity edema. 2+ pedal pulses. No carotid bruits.  Abdomen: no tenderness, no masses palpated. No hepatosplenomegaly. Bowel sounds positive.  Musculoskeletal: no clubbing / cyanosis. No joint deformity upper and lower extremities. Good ROM, no contractures. Normal muscle tone.  Skin: no rashes, lesions, ulcers. No induration Neurologic: CN 2-12 grossly intact. Sensation intact, DTR normal. Strength 5/5 in all 4.  Psychiatric: Normal judgment and insight. Alert and oriented x 3. Normal mood.    Labs on Admission: I have personally reviewed following labs and imaging studies  CBC: Recent Labs  Lab 08/29/17 1505  WBC 21.9*  NEUTROABS 17.9*  HGB 14.0  HCT 39.8  MCV 85.4  PLT 224   Basic Metabolic Panel: Recent Labs  Lab 08/29/17 1505  NA 138  K 4.3  CL 106  CO2 24  GLUCOSE 125*  BUN 17  CREATININE 0.83  CALCIUM 8.6*   GFR: Estimated Creatinine Clearance: 187.9 mL/min (by C-G formula based on SCr of 0.83 mg/dL). Liver Function Tests: Recent Labs  Lab 08/29/17 1505  AST 17  ALT 22  ALKPHOS 82  BILITOT 0.8  PROT 6.7  ALBUMIN 2.7*   No results for input(s): LIPASE, AMYLASE in the last 168 hours. No results for input(s): AMMONIA in the last 168 hours. Coagulation Profile: No results for input(s): INR, PROTIME in the last 168 hours. Cardiac Enzymes: Recent Labs  Lab 08/29/17 1505  TROPONINI 0.05*   BNP (last 3 results) No results for input(s): PROBNP in the last 8760 hours. HbA1C: No results for input(s): HGBA1C in the last 72 hours. CBG: No results for input(s): GLUCAP in the last 168  hours. Lipid Profile: No results for input(s): CHOL, HDL, LDLCALC, TRIG, CHOLHDL, LDLDIRECT in the last 72 hours. Thyroid Function Tests: No results for input(s): TSH, T4TOTAL, FREET4, T3FREE, THYROIDAB in the last 72 hours. Anemia Panel: No results for input(s): VITAMINB12, FOLATE, FERRITIN, TIBC, IRON, RETICCTPCT in the last 72 hours. Urine analysis:    Component Value Date/Time   COLORURINE YELLOW 04/04/2017 0832   APPEARANCEUR CLEAR 04/04/2017 0832   LABSPEC 1.017 04/04/2017 0832   PHURINE 6.0 04/04/2017 0832   GLUCOSEU NEGATIVE 04/04/2017 0832   HGBUR SMALL (  A) 04/04/2017 0832   BILIRUBINUR NEGATIVE 04/04/2017 0832   KETONESUR NEGATIVE 04/04/2017 0832   PROTEINUR 100 (A) 04/04/2017 0832   UROBILINOGEN 0.2 10/17/2014 0350   NITRITE NEGATIVE 04/04/2017 0832   LEUKOCYTESUR NEGATIVE 04/04/2017 0832   Sepsis Labs: !!!!!!!!!!!!!!!!!!!!!!!!!!!!!!!!!!!!!!!!!!!! (procalcitonin:4,lacticidven:4) ) Recent Results (from the past 240 hour(s))  Culture, blood (Routine X 2) w Reflex to ID Panel     Status: None (Preliminary result)   Collection Time: 08/29/17  3:05 PM  Result Value Ref Range Status   Specimen Description RIGHT ANTECUBITAL  Final   Special Requests   Final    BOTTLES DRAWN AEROBIC AND ANAEROBIC Blood Culture adequate volume Performed at [facility], [address] [place], [place] [address]    Culture PENDING  Incomplete   Report Status PENDING  Incomplete  Culture, blood (Routine X 2) w Reflex to ID Panel     Status: None (Preliminary result)   Collection Time: 08/29/17  3:43 PM  Result Value Ref Range Status   Specimen Description LEFT ANTECUBITAL  Final   Special Requests   Final    BOTTLES DRAWN AEROBIC AND ANAEROBIC Blood Culture adequate volume Performed at [facility], [address] [place], [place] [address]    Culture PENDING  Incomplete   Report Status PENDING  Incomplete     Radiological Exams on Admission: Ct Angio Chest Pe W And/or Wo  Contrast  Result Date: 08/29/2017 CLINICAL DATA:  Worsening chest x-ray, EXAM: CT ANGIOGRAPHY CHEST WITH CONTRAST TECHNIQUE: Multidetector CT imaging of the chest was performed using the standard protocol during bolus administration of intravenous contrast. Multiplanar CT image reconstructions and MIPs were obtained to evaluate the vascular anatomy. CONTRAST:  ISOVUE-370 IOPAMIDOL (ISOVUE-370) INJECTION 76% COMPARISON:  None. FINDINGS: Cardiovascular: Suboptimal opacification of the pulmonary arteries. No large central pulmonary embolus, but evaluation of the lobar and segmental branches is limited secondary to suboptimal contrast opacification. Normal heart size. No pericardial effusion. Normal caliber thoracic aorta. Mediastinum/Nodes: No enlarged mediastinal, hilar, or axillary lymph nodes. Thyroid gland, trachea, and esophagus demonstrate no significant findings. Lungs/Pleura: Large partial loculated right pleural effusion with compressive atelectasis with only a small area of aerated lung in the upper chest. Left lung is clear. No pneumothorax. Upper Abdomen: No acute upper abdominal abnormality. Low attenuation of the liver as can be seen with hepatic steatosis. Musculoskeletal: No acute osseous abnormality. No aggressive osseous lesion.Broad-based disc osteophyte complex at T11-12. Review of the MIP images confirms the above findings. IMPRESSION: 1. Suboptimal opacification of the pulmonary arteries. No large central pulmonary embolus, but evaluation of the lobar and segmental branches is limited secondary to suboptimal contrast opacification. 2. Large partial loculated right pleural effusion with compressive atelectasis with only a small area of aerated lung in the upper chest. Electronically Signed   By: Elige Ko   On: 08/29/2017 16:40    Old chart reviewed Case discussed with Dr. Ranae Palms in the ED CT reviewed large loculated right pleural effusion noted  Assessment/Plan 37 year old  male with loculated right-sided pleural effusion possibly associated with pneumonia with sepsis Principal Problem:   Loculated pleural effusion-patient will be transferred to [facility] to go to the OR with Dr. Maryelizabeth Rowan.  Hold anticoagulants at this time for this reason.  Placed on antibiotics as below.  Supplemental oxygen as needed.  Have arranged for patient to get a stepdown bed at [facility] sooner rather than later with through our flow manager.  Active Problems:   PNA (pneumonia)-obtain blood cultures  and sputum cultures.  Placed on on vancomycin, Rocephin, azithromycin IV for severe community-acquired pneumonia.  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  We will check a HIV panel.  This raises the concern for possible underlying immunodeficiency disorder.  Once patient is stabilized would consider getting ID opinion about this at some point.    Sepsis (HCC)-lactic acid level normal.  Patient tachycardic with a white count over 20 with a systolic blood pressure in the 90s.    Chronic lower back pain-noted stable at this time     DVT prophylaxis: SCDs Code Status: Full Family Communication: Wife Disposition Plan: Per day team Consults called: CT surgeon Dr. Virgel Gess at [facility] Admission status: Admission   Clayton Jensen A MD [facility]  If 7PM-7AM, please contact night-coverage [email or web address] Password [facility]  08/29/2017, 6:06 PM

## 2017-08-30 ENCOUNTER — Encounter (HOSPITAL_COMMUNITY)
Admission: EM | Disposition: A | Discharge status: Home Health | Payer: Self-pay | Source: Home / Self Care | Attending: Surgery

## 2017-08-30 ENCOUNTER — Inpatient Hospital Stay (HOSPITAL_COMMUNITY): Payer: 59

## 2017-08-30 DIAGNOSIS — J869 Pyothorax without fistula: Secondary | ICD-10-CM | POA: Diagnosis present

## 2017-08-30 DIAGNOSIS — J90 Pleural effusion, not elsewhere classified: Secondary | ICD-10-CM

## 2017-08-30 DIAGNOSIS — Z8709 Personal history of other diseases of the respiratory system: Secondary | ICD-10-CM | POA: Diagnosis present

## 2017-08-30 HISTORY — PX: VIDEO ASSISTED THORACOSCOPY (VATS)/EMPYEMA: SHX6172

## 2017-08-30 LAB — POCT I-STAT 3, ART BLOOD GAS (G3+)
ACID-BASE DEFICIT: 2 mmol/L (ref 0.0–2.0)
Acid-base deficit: 5 mmol/L — ABNORMAL HIGH (ref 0.0–2.0)
Bicarbonate: 21.4 mmol/L (ref 20.0–28.0)
Bicarbonate: 23.4 mmol/L (ref 20.0–28.0)
O2 SAT: 99 %
O2 Saturation: 98 %
PH ART: 7.304 — AB (ref 7.350–7.450)
PH ART: 7.33 — AB (ref 7.350–7.450)
PO2 ART: 180 mmHg — AB (ref 83.0–108.0)
Patient temperature: 100.4
TCO2: 23 mmol/L (ref 22–32)
TCO2: 25 mmol/L (ref 22–32)
pCO2 arterial: 43.4 mmHg (ref 32.0–48.0)
pCO2 arterial: 44.9 mmHg (ref 32.0–48.0)
pO2, Arterial: 109 mmHg — ABNORMAL HIGH (ref 83.0–108.0)

## 2017-08-30 LAB — POCT I-STAT 7, (LYTES, BLD GAS, ICA,H+H)
Acid-base deficit: 4 mmol/L — ABNORMAL HIGH (ref 0.0–2.0)
Bicarbonate: 25 mmol/L (ref 20.0–28.0)
Calcium, Ion: 1.19 mmol/L (ref 1.15–1.40)
HEMATOCRIT: 42 % (ref 39.0–52.0)
Hemoglobin: 14.3 g/dL (ref 13.0–17.0)
O2 SAT: 94 %
PCO2 ART: 59.9 mmHg — AB (ref 32.0–48.0)
PO2 ART: 86 mmHg (ref 83.0–108.0)
POTASSIUM: 4.6 mmol/L (ref 3.5–5.1)
Sodium: 140 mmol/L (ref 135–145)
TCO2: 27 mmol/L (ref 22–32)
pH, Arterial: 7.228 — ABNORMAL LOW (ref 7.350–7.450)

## 2017-08-30 LAB — GLUCOSE, CAPILLARY
GLUCOSE-CAPILLARY: 167 mg/dL — AB (ref 65–99)
Glucose-Capillary: 207 mg/dL — ABNORMAL HIGH (ref 65–99)

## 2017-08-30 LAB — TYPE AND SCREEN
ABO/RH(D): O POS
Antibody Screen: NEGATIVE

## 2017-08-30 LAB — SURGICAL PCR SCREEN
MRSA, PCR: NEGATIVE
STAPHYLOCOCCUS AUREUS: NEGATIVE

## 2017-08-30 LAB — STREP PNEUMONIAE URINARY ANTIGEN: Strep Pneumo Urinary Antigen: NEGATIVE

## 2017-08-30 SURGERY — VIDEO ASSISTED THORACOSCOPY (VATS)/EMPYEMA
Anesthesia: General | Site: Chest | Laterality: Right

## 2017-08-30 MED ORDER — METOCLOPRAMIDE HCL 5 MG/ML IJ SOLN
10.0000 mg | Freq: Four times a day (QID) | INTRAMUSCULAR | Status: DC
  Administered 2017-08-30 – 2017-09-01 (×6): 10 mg via INTRAVENOUS
  Filled 2017-08-30 (×6): qty 2

## 2017-08-30 MED ORDER — ONDANSETRON HCL 4 MG/2ML IJ SOLN
INTRAMUSCULAR | Status: AC
  Filled 2017-08-30: qty 2

## 2017-08-30 MED ORDER — PROPOFOL 10 MG/ML IV BOLUS
INTRAVENOUS | Status: AC
  Filled 2017-08-30: qty 20

## 2017-08-30 MED ORDER — SODIUM CHLORIDE 0.9 % IV SOLN
2.0000 mg/h | INTRAVENOUS | Status: DC
  Administered 2017-08-30 (×2): 2 mg/h via INTRAVENOUS
  Filled 2017-08-30 (×3): qty 10

## 2017-08-30 MED ORDER — PHENYLEPHRINE HCL-NACL 10-0.9 MG/250ML-% IV SOLN
0.0000 ug/min | INTRAVENOUS | Status: DC
  Administered 2017-08-30: 20 ug/min via INTRAVENOUS
  Filled 2017-08-30 (×2): qty 250

## 2017-08-30 MED ORDER — ACETAMINOPHEN 325 MG PO TABS: 650.0000 mg | ORAL_TABLET | ORAL | Status: DC | PRN

## 2017-08-30 MED ORDER — MIDAZOLAM HCL 2 MG/2ML IJ SOLN
2.0000 mg | INTRAMUSCULAR | Status: DC | PRN
  Administered 2017-08-31: 2 mg via INTRAVENOUS
  Filled 2017-08-30: qty 2

## 2017-08-30 MED ORDER — ROCURONIUM BROMIDE 10 MG/ML (PF) SYRINGE
PREFILLED_SYRINGE | INTRAVENOUS | Status: AC
  Filled 2017-08-30: qty 10

## 2017-08-30 MED ORDER — FENTANYL CITRATE (PF) 250 MCG/5ML IJ SOLN
INTRAMUSCULAR | Status: DC | PRN
  Administered 2017-08-30: 50 ug via INTRAVENOUS
  Administered 2017-08-30: 150 ug via INTRAVENOUS
  Administered 2017-08-30: 50 ug via INTRAVENOUS

## 2017-08-30 MED ORDER — SODIUM CHLORIDE 0.9 % IJ SOLN
INTRAMUSCULAR | Status: AC
Start: 2017-08-30 — End: ?
  Filled 2017-08-30: qty 40

## 2017-08-30 MED ORDER — DEXAMETHASONE SODIUM PHOSPHATE 10 MG/ML IJ SOLN
INTRAMUSCULAR | Status: DC | PRN
  Administered 2017-08-30: 10 mg via INTRAVENOUS

## 2017-08-30 MED ORDER — LACTATED RINGERS IV SOLN
INTRAVENOUS | Status: DC
  Administered 2017-08-30: 13:00:00 via INTRAVENOUS

## 2017-08-30 MED ORDER — MIDAZOLAM HCL 2 MG/2ML IJ SOLN
INTRAMUSCULAR | Status: DC | PRN
  Administered 2017-08-30: 2 mg via INTRAVENOUS

## 2017-08-30 MED ORDER — MIDAZOLAM HCL 2 MG/2ML IJ SOLN
INTRAMUSCULAR | Status: AC
  Filled 2017-08-30: qty 2

## 2017-08-30 MED ORDER — SUCCINYLCHOLINE CHLORIDE 200 MG/10ML IV SOSY
PREFILLED_SYRINGE | INTRAVENOUS | Status: AC
  Filled 2017-08-30: qty 10

## 2017-08-30 MED ORDER — HYDROMORPHONE HCL 2 MG/ML IJ SOLN: 0.3000 mg | INTRAMUSCULAR | Status: DC | PRN

## 2017-08-30 MED ORDER — FENTANYL CITRATE (PF) 250 MCG/5ML IJ SOLN
INTRAMUSCULAR | Status: AC
  Filled 2017-08-30: qty 5

## 2017-08-30 MED ORDER — INSULIN ASPART 100 UNIT/ML ~~LOC~~ SOLN
0.0000 [IU] | SUBCUTANEOUS | Status: DC
  Administered 2017-08-30 – 2017-08-31 (×3): 4 [IU] via SUBCUTANEOUS
  Administered 2017-08-31: 8 [IU] via SUBCUTANEOUS
  Administered 2017-08-31: 4 [IU] via SUBCUTANEOUS
  Administered 2017-08-31: 2 [IU] via SUBCUTANEOUS
  Administered 2017-08-31: 8 [IU] via SUBCUTANEOUS

## 2017-08-30 MED ORDER — ONDANSETRON HCL 4 MG/2ML IJ SOLN: 4.0000 mg | Freq: Four times a day (QID) | INTRAMUSCULAR | Status: DC | PRN

## 2017-08-30 MED ORDER — ARTIFICIAL TEARS OPHTHALMIC OINT
TOPICAL_OINTMENT | OPHTHALMIC | Status: AC
  Filled 2017-08-30: qty 7

## 2017-08-30 MED ORDER — TRAMADOL HCL 50 MG PO TABS: 50.0000 mg | ORAL_TABLET | Freq: Four times a day (QID) | ORAL | Status: DC | PRN

## 2017-08-30 MED ORDER — ROCURONIUM BROMIDE 10 MG/ML (PF) SYRINGE
PREFILLED_SYRINGE | INTRAVENOUS | Status: DC | PRN
  Administered 2017-08-30: 30 mg via INTRAVENOUS
  Administered 2017-08-30: 50 mg via INTRAVENOUS
  Administered 2017-08-30: 30 mg via INTRAVENOUS
  Administered 2017-08-30: 50 mg via INTRAVENOUS
  Administered 2017-08-30: 20 mg via INTRAVENOUS

## 2017-08-30 MED ORDER — DEXMEDETOMIDINE HCL IN NACL 200 MCG/50ML IV SOLN
0.4000 ug/kg/h | INTRAVENOUS | Status: DC
  Administered 2017-08-30: 1.2 ug/kg/h via INTRAVENOUS
  Administered 2017-08-30: 0.9 ug/kg/h via INTRAVENOUS
  Administered 2017-08-30: 1.2 ug/kg/h via INTRAVENOUS
  Administered 2017-08-30: 0.9 ug/kg/h via INTRAVENOUS
  Administered 2017-08-30: 0.4 ug/kg/h via INTRAVENOUS
  Administered 2017-08-30: 1.2 ug/kg/h via INTRAVENOUS
  Administered 2017-08-30: 0.8 ug/kg/h via INTRAVENOUS
  Administered 2017-08-31 (×2): 1.2 ug/kg/h via INTRAVENOUS
  Administered 2017-08-31: 1 ug/kg/h via INTRAVENOUS
  Administered 2017-08-31 (×4): 1.2 ug/kg/h via INTRAVENOUS
  Filled 2017-08-30: qty 100
  Filled 2017-08-30: qty 200
  Filled 2017-08-30 (×2): qty 100
  Filled 2017-08-30: qty 50
  Filled 2017-08-30: qty 200

## 2017-08-30 MED ORDER — DEXAMETHASONE SODIUM PHOSPHATE 10 MG/ML IJ SOLN
INTRAMUSCULAR | Status: AC
  Filled 2017-08-30: qty 1

## 2017-08-30 MED ORDER — ROCURONIUM BROMIDE 10 MG/ML (PF) SYRINGE
PREFILLED_SYRINGE | INTRAVENOUS | Status: AC
  Filled 2017-08-30: qty 5

## 2017-08-30 MED ORDER — ENOXAPARIN SODIUM 40 MG/0.4ML ~~LOC~~ SOLN
40.0000 mg | Freq: Every day | SUBCUTANEOUS | Status: DC
  Administered 2017-08-31 – 2017-09-05 (×6): 40 mg via SUBCUTANEOUS
  Filled 2017-08-30 (×6): qty 0.4

## 2017-08-30 MED ORDER — PROPOFOL 10 MG/ML IV BOLUS
INTRAVENOUS | Status: DC | PRN
  Administered 2017-08-30: 100 mg via INTRAVENOUS
  Administered 2017-08-30: 200 mg via INTRAVENOUS

## 2017-08-30 MED ORDER — BISACODYL 5 MG PO TBEC
10.0000 mg | DELAYED_RELEASE_TABLET | Freq: Every day | ORAL | Status: DC
  Administered 2017-08-31 – 2017-09-05 (×4): 10 mg via ORAL
  Filled 2017-08-30 (×5): qty 2

## 2017-08-30 MED ORDER — SODIUM CHLORIDE 0.9 % IV SOLN: INTRAVENOUS | Status: DC

## 2017-08-30 MED ORDER — EPINEPHRINE PF 1 MG/10ML IJ SOSY
PREFILLED_SYRINGE | INTRAMUSCULAR | Status: AC
  Filled 2017-08-30: qty 10

## 2017-08-30 MED ORDER — OXYCODONE HCL 5 MG PO TABS: 5.0000 mg | ORAL_TABLET | Freq: Four times a day (QID) | ORAL | Status: DC | PRN

## 2017-08-30 MED ORDER — LEVALBUTEROL HCL 0.63 MG/3ML IN NEBU: 0.6300 mg | INHALATION_SOLUTION | Freq: Four times a day (QID) | RESPIRATORY_TRACT | Status: DC

## 2017-08-30 MED ORDER — POTASSIUM CHLORIDE 10 MEQ/50ML IV SOLN
10.0000 meq | Freq: Every day | INTRAVENOUS | Status: DC | PRN
  Filled 2017-08-30: qty 50

## 2017-08-30 MED ORDER — SODIUM CHLORIDE 0.9 % IV SOLN
1.5000 g | Freq: Four times a day (QID) | INTRAVENOUS | Status: DC
  Administered 2017-08-30 – 2017-09-01 (×7): 1.5 g via INTRAVENOUS
  Filled 2017-08-30 (×11): qty 1.5

## 2017-08-30 MED ORDER — LACTATED RINGERS IV SOLN
INTRAVENOUS | Status: DC | PRN
  Administered 2017-08-30: 14:00:00 via INTRAVENOUS

## 2017-08-30 MED ORDER — MORPHINE SULFATE (PF) 4 MG/ML IV SOLN
4.0000 mg | INTRAVENOUS | Status: DC | PRN
  Administered 2017-08-30 (×2): 4 mg via INTRAVENOUS
  Filled 2017-08-30: qty 1

## 2017-08-30 MED ORDER — EPHEDRINE SULFATE 50 MG/ML IJ SOLN
INTRAMUSCULAR | Status: AC
  Filled 2017-08-30: qty 1

## 2017-08-30 MED ORDER — 0.9 % SODIUM CHLORIDE (POUR BTL) OPTIME
TOPICAL | Status: DC | PRN
  Administered 2017-08-30: 2000 mL

## 2017-08-30 MED ORDER — MORPHINE SULFATE (PF) 4 MG/ML IV SOLN
INTRAVENOUS | Status: AC
  Administered 2017-08-30: 4 mg via INTRAVENOUS
  Filled 2017-08-30: qty 1

## 2017-08-30 MED ORDER — DEXTROSE-NACL 5-0.9 % IV SOLN
INTRAVENOUS | Status: DC
  Administered 2017-08-30 – 2017-08-31 (×3): via INTRAVENOUS

## 2017-08-30 MED ORDER — METHOCARBAMOL 500 MG PO TABS: 500.0000 mg | ORAL_TABLET | Freq: Four times a day (QID) | ORAL | Status: DC | PRN

## 2017-08-30 MED ORDER — LIDOCAINE 2% (20 MG/ML) 5 ML SYRINGE
INTRAMUSCULAR | Status: DC | PRN
  Administered 2017-08-30: 60 mg via INTRAVENOUS

## 2017-08-30 MED ORDER — CALCIUM CHLORIDE 10 % IV SOLN
INTRAVENOUS | Status: AC
  Filled 2017-08-30: qty 10

## 2017-08-30 MED ORDER — PHENYLEPHRINE 40 MCG/ML (10ML) SYRINGE FOR IV PUSH (FOR BLOOD PRESSURE SUPPORT)
PREFILLED_SYRINGE | INTRAVENOUS | Status: AC
  Filled 2017-08-30: qty 10

## 2017-08-30 MED ORDER — LEVALBUTEROL HCL 0.63 MG/3ML IN NEBU
0.6300 mg | INHALATION_SOLUTION | Freq: Four times a day (QID) | RESPIRATORY_TRACT | Status: DC
  Administered 2017-08-30 – 2017-09-01 (×9): 0.63 mg via RESPIRATORY_TRACT
  Filled 2017-08-30 (×10): qty 3

## 2017-08-30 MED ORDER — IBUPROFEN 200 MG PO TABS: 600.0000 mg | ORAL_TABLET | Freq: Four times a day (QID) | ORAL | Status: DC | PRN

## 2017-08-30 MED ORDER — FENTANYL 2500MCG IN NS 250ML (10MCG/ML) PREMIX INFUSION
0.0000 ug/h | INTRAVENOUS | Status: DC
  Administered 2017-08-31: 25 ug/h via INTRAVENOUS
  Filled 2017-08-30: qty 250

## 2017-08-30 MED ORDER — SENNOSIDES-DOCUSATE SODIUM 8.6-50 MG PO TABS
1.0000 | ORAL_TABLET | Freq: Every day | ORAL | Status: DC
  Administered 2017-08-31 – 2017-09-04 (×4): 1 via ORAL
  Filled 2017-08-30 (×5): qty 1

## 2017-08-30 SURGICAL SUPPLY — 71 items
ADH SKN CLS APL DERMABOND .7 (GAUZE/BANDAGES/DRESSINGS) ×1
APL SRG 22X2 LUM MLBL SLNT (VASCULAR PRODUCTS)
APL SRG 7X2 LUM MLBL SLNT (VASCULAR PRODUCTS)
APPLICATOR TIP COSEAL (VASCULAR PRODUCTS) IMPLANT
APPLICATOR TIP EXT COSEAL (VASCULAR PRODUCTS) IMPLANT
CANISTER SUCT 3000ML PPV (MISCELLANEOUS) ×3 IMPLANT
CATH KIT ON Q 5IN SLV (PAIN MANAGEMENT) IMPLANT
CATH THORACIC 28FR (CATHETERS) ×2 IMPLANT
CATH THORACIC 36FR (CATHETERS) ×2 IMPLANT
CATH THORACIC 36FR RT ANG (CATHETERS) ×2 IMPLANT
CLEANER TIP ELECTROSURG 2X2 (MISCELLANEOUS) ×3 IMPLANT
CLIP VESOCCLUDE MED 6/CT (CLIP) IMPLANT
CONN 3/8X3/8 GISH STERILE (MISCELLANEOUS) ×2 IMPLANT
CONN Y 3/8X3/8X3/8  BEN (MISCELLANEOUS) ×2
CONN Y 3/8X3/8X3/8 BEN (MISCELLANEOUS) IMPLANT
CONT SPEC 4OZ CLIKSEAL STRL BL (MISCELLANEOUS) ×8 IMPLANT
DERMABOND ADVANCED (GAUZE/BANDAGES/DRESSINGS) ×2
DERMABOND ADVANCED .7 DNX12 (GAUZE/BANDAGES/DRESSINGS) IMPLANT
DRAPE INCISE IOBAN 66X45 STRL (DRAPES) ×2 IMPLANT
DRAPE LAPAROSCOPIC ABDOMINAL (DRAPES) ×3 IMPLANT
DRAPE SLUSH/WARMER DISC (DRAPES) ×2 IMPLANT
DRAPE WARM FLUID 44X44 (DRAPE) ×1 IMPLANT
ELECT BLADE 6.5 EXT (BLADE) ×2 IMPLANT
ELECT REM PT RETURN 9FT ADLT (ELECTROSURGICAL) ×3
ELECTRODE REM PT RTRN 9FT ADLT (ELECTROSURGICAL) ×1 IMPLANT
GAUZE SPONGE 4X4 12PLY STRL (GAUZE/BANDAGES/DRESSINGS) ×3 IMPLANT
GAUZE SPONGE 4X4 12PLY STRL LF (GAUZE/BANDAGES/DRESSINGS) ×2 IMPLANT
GLOVE BIO SURGEON STRL SZ 6.5 (GLOVE) ×4 IMPLANT
GLOVE BIO SURGEONS STRL SZ 6.5 (GLOVE) ×2
GLOVE EUDERMIC 7 POWDERFREE (GLOVE) ×6 IMPLANT
GOWN STRL REUS W/ TWL LRG LVL3 (GOWN DISPOSABLE) ×4 IMPLANT
GOWN STRL REUS W/ TWL XL LVL3 (GOWN DISPOSABLE) ×1 IMPLANT
GOWN STRL REUS W/TWL LRG LVL3 (GOWN DISPOSABLE) ×12
GOWN STRL REUS W/TWL XL LVL3 (GOWN DISPOSABLE) ×3
KIT BASIN OR (CUSTOM PROCEDURE TRAY) ×3 IMPLANT
KIT SUCTION CATH 14FR (SUCTIONS) ×1 IMPLANT
KIT TURNOVER KIT B (KITS) ×3 IMPLANT
NS IRRIG 1000ML POUR BTL (IV SOLUTION) ×8 IMPLANT
PACK CHEST (CUSTOM PROCEDURE TRAY) ×3 IMPLANT
PAD ARMBOARD 7.5X6 YLW CONV (MISCELLANEOUS) ×6 IMPLANT
SEALANT SURG COSEAL 4ML (VASCULAR PRODUCTS) IMPLANT
SEALANT SURG COSEAL 8ML (VASCULAR PRODUCTS) IMPLANT
SOLUTION ANTI FOG 6CC (MISCELLANEOUS) ×2 IMPLANT
STAPLER VISISTAT 35W (STAPLE) ×2 IMPLANT
SUT PROLENE 3 0 SH DA (SUTURE) IMPLANT
SUT PROLENE 4 0 RB 1 (SUTURE)
SUT PROLENE 4-0 RB1 .5 CRCL 36 (SUTURE) IMPLANT
SUT SILK  1 MH (SUTURE) ×6
SUT SILK 1 MH (SUTURE) ×2 IMPLANT
SUT SILK 1 TIES 10X30 (SUTURE) IMPLANT
SUT SILK 2 0SH CR/8 30 (SUTURE) IMPLANT
SUT SILK 3 0SH CR/8 30 (SUTURE) IMPLANT
SUT VIC AB 1 CTX 18 (SUTURE) IMPLANT
SUT VIC AB 1 CTX 36 (SUTURE) ×6
SUT VIC AB 1 CTX36XBRD ANBCTR (SUTURE) IMPLANT
SUT VIC AB 2-0 CTX 36 (SUTURE) ×2 IMPLANT
SUT VIC AB 2-0 UR6 27 (SUTURE) IMPLANT
SUT VIC AB 3-0 MH 27 (SUTURE) IMPLANT
SUT VIC AB 3-0 X1 27 (SUTURE) ×2 IMPLANT
SUT VICRYL 2 TP 1 (SUTURE) ×2 IMPLANT
SWAB COLLECTION DEVICE MRSA (MISCELLANEOUS) IMPLANT
SWAB CULTURE ESWAB REG 1ML (MISCELLANEOUS) IMPLANT
SYSTEM SAHARA CHEST DRAIN ATS (WOUND CARE) ×3 IMPLANT
TAPE CLOTH 4X10 WHT NS (GAUZE/BANDAGES/DRESSINGS) ×3 IMPLANT
TAPE CLOTH SURG 4X10 WHT LF (GAUZE/BANDAGES/DRESSINGS) ×2 IMPLANT
TIP APPLICATOR SPRAY EXTEND 16 (VASCULAR PRODUCTS) IMPLANT
TOWEL GREEN STERILE (TOWEL DISPOSABLE) ×3 IMPLANT
TOWEL GREEN STERILE FF (TOWEL DISPOSABLE) ×3 IMPLANT
TRAP SPECIMEN MUCOUS 40CC (MISCELLANEOUS) IMPLANT
TRAY FOLEY MTR SLVR 14FR STAT (SET/KITS/TRAYS/PACK) ×3 IMPLANT
WATER STERILE IRR 1000ML POUR (IV SOLUTION) ×6 IMPLANT

## 2017-08-30 NOTE — Progress Notes (Signed)
Called RN Sharyl Nimrod in short stay to make aware that patient did not receive 1100 vancomycin dose. Pharmacy notified about missing dose x 2. RN will address if I do not have to send to tube station #[phone]. Thomas Hoff, RN

## 2017-08-30 NOTE — Consult Note (Signed)
Full consult note to follow. Morbidly obese 37 year old " social smoker" who had sudden onset of right chest pain after twisting Tuesday to put his child down. He has had progressive shortness of breath and right chest pain and CT at [facility] showed a large loculated right pleural effusion with compressive atelectasis of right lung suggesting possible empyema. He has low grade fever and leukocytosis to 22K. This will require drainage in the OR most likely with thoracotomy. I discussed the operative procedure with the patient and wife including alternatives, benefits and risks; including but not limited to bleeding, blood transfusion, infection, prolonged air leak, respiratory failure.  Clayton Jensen understands and agrees to proceed.  We will schedule surgery for this afternoon.

## 2017-08-30 NOTE — Op Note (Signed)
08/30/2017 Clayton Jensen [id number]  Surgeon: Alleen Borne, MD   First Assistant: Doree Fudge, PA-C  Preoperative Diagnosis: Right empyema   Postoperative Diagnosis: Right empyema   Procedure:  1. Right thoracotomy  2. Drainage of empyema  3. Decortication of the Right lung   Anesthesia: General Endotracheal   Clinical History/Surgical Indication:   This 37 year old gentleman presents with fairly sudden onset of right chest pain and shortness of breath with his initial chest x-ray the next day being unremarkable.  He was treated as if this was costochondritis with prednisone and nonsteroidal anti-inflammatory agents but his symptoms persisted and progress and a follow-up chest x-ray showed a large right pleural effusion.  CT of the chest shows a large loculated right pleural effusion suggesting empyema.  He has low-grade fever and leukocytosis.  This will require operative drainage most likely with right thoracotomy for possibly thoracoscopy. I discussed the operative procedure with the patient andwifeincluding alternatives, benefits and risks; including but not limited to bleeding, blood transfusion, infection, prolonged air leak, respiratory failure.Clayton Albino Foyeunderstands and agrees to proceed.    Preparation:  The patient was seen in the preoperative holding area and the correct patient, correct operation, correct operative sidewere confirmed with the patient after reviewing the medical record and CT scan. The consent was signed by me. Preoperative antibiotics were given. Theright side of the chest was signed by me. The patient was taken back to the operating room and positioned supine on the operating room table. After being placed under general endotracheal anesthesia by the anesthesia team using a double lumen tube a foley catheter was placed. The patient was turned into the left lateral decubitus position. With the patient turned in the lateral decubitus position it  was difficult to ventilate and oxygenate him likely due to the large amount of right pleural fluid with complete collapse of the right lung as well as morbid obesity. We continued two lung ventilation.The chest was prepped with betadine soap and solution. A surgical time-out was taken and the correct patient,operative side, and operative procedure were confirmed with the nursing and anesthesia staff.   Operative Procedure:    A short lateral thoracotomy incision was made and the chest entered through the 6th ICS. There was immediate release of 1800 cc of white, purulent fluid. After drainage of this fluid the oxygenation and ventilation was improved and we were able to go to single lung ventilation. The pleural space was filled with a yellow white multiloculated empyema that was purulent with a thick fibrinous peel over the lung. The empyema was completely drained and the right lung decorticated. This allowed complete expansion of the lung. The chest was irrigated with warm saline. Hemostasis was complete. Two 36 F chest tubes and one 32 F Bard drains were placed through separate stab incisions and were positioned in the costophrenic sulcus posteriorly, posteriorly up to the apex and anteriorly in the pleural space. The ribs were reapproximated with # 2 vicryl pericostal sutures and the muscles closed with continuous 0 vicryl suture. The subcutaneous tissue was closed with 2-0 vicryl continuous suture. The skin was closed with staples.  All sponge, needle, and instrument counts were reported correct at the end of the case. Dry sterile dressings were placed over the incisions and around the chest tubes which were connected to pleurevac suction. The patient was turned supine, the double lumen tube switched to a single lumen tube and then transported to the ICU in satisfactory and stable condition. We felt that  it would be best to keep him on the ventilator overnight.

## 2017-08-30 NOTE — Anesthesia Procedure Notes (Addendum)
Procedure Name: Intubation Date/Time: 08/30/2017 2:00 PM Performed by: Bryson Corona, CRNA Pre-anesthesia Checklist: Patient identified, Emergency Drugs available, Suction available and Patient being monitored Patient Re-evaluated:Patient Re-evaluated prior to induction Oxygen Delivery Method: Circle System Utilized Preoxygenation: Pre-oxygenation with 100% oxygen Induction Type: IV induction Ventilation: Two handed mask ventilation required and Oral airway inserted - appropriate to patient size Laryngoscope Size: 4 and Glidescope Grade View: Grade I Tube type: Oral Endobronchial tube: Double lumen EBT, EBT position confirmed by fiberoptic bronchoscope and Left and 41 Fr Number of attempts: 3 Airway Equipment and Method: Stylet and Oral airway Placement Confirmation: ETT inserted through vocal cords under direct vision,  positive ETCO2 and breath sounds checked- equal and bilateral Tube secured with: Tape Dental Injury: Teeth and Oropharynx as per pre-operative assessment  Comments: DL x 1 with MAC 4 grade 1 view but unable to pass DLT through cords. Pt desaturating despite 2 handed mask. DL x 2 MAC 4 grade 2 view, placed 8.5 ETT. Tube exchanged with Glidescope size 4. Grade 1 view.

## 2017-08-30 NOTE — Progress Notes (Addendum)
[facility] TEAM 1 - Stepdown/ICU TEAM  Clayton Jensen  ZOX:[id number] DOB: Jan 26, 1981 DOA: 08/29/2017 PCP: Merlyn Albert, MD    Brief Narrative:  37 y.o. male with history of viral encephalitis tx at [facility] in December and chronic low back pain who presented w/ 3-4 days of right-sided chest pain.  He was rocking his baby when he turned and felt immediate pain in his R lateral chest.  The following day he went to UC, had a normal CXR, and was tx for costochondritis.  He continued to have pain and then began to develop SOB and fevers.  A repeat UC visit and a repeat CXR revealed a new large R effusion and he was sent to the ED where a CT noted a partially loculated large R pleural effusion.  He was subsequently transferred to [facility] for TCTS eval and expected VATS.  Significant Events: 5/27 admit to [facility] - transfer to [facility] 5/28 OR for R VATS  Subjective: The patient is resting completely in a bedside chair.  He reports ongoing shortness of breath and pain in the right side without significant change since his presentation.  He denies nausea vomiting headache or substernal chest pressure.  Assessment & Plan:  Loculated Large R Pleural Effusion  Exact etiology not clear - for VATS today - fluid studies should help in diagnosis - cont empiric abx for now   SIRS v/s Sepsis No clear infectious source at present, but there is some suspicion of PNA - cont abx for now and follow   Morbid Obesity - Body mass index is 50.2 kg/m.   DVT prophylaxis: SCDs Code Status: FULL CODE Family Communication: discussed the plans with patient and wife at bedside Disposition Plan: SDU  Consultants:  TCTS  Antimicrobials:  Zosyn 5/27  Vancomycin 5/27  Rocephin 5/27 > Azithro 5/27 >  Objective: Blood pressure 134/69, pulse 99, temperature 99 F (37.2 C), temperature source Oral, resp. rate (!) 35, height 6' (1.829 m), weight (!) 167.9 kg (370 lb 2.4 oz), SpO2 95 %.  Intake/Output Summary (Last 24  hours) at 08/30/2017 1123 Last data filed at 08/29/2017 2100 Gross per 24 hour  Intake 240 ml  Output -  Net 240 ml   Filed Weights   08/29/17 1414 08/29/17 2200  Weight: (!) 158.8 kg (350 lb) (!) 167.9 kg (370 lb 2.4 oz)    Examination: General: No acute respiratory distress Lungs: poor air movement in right base and right mid fields with good air movement in other areas with no wheezing Cardiovascular: Regular rate and rhythm without murmur gallop or rub normal S1 and S2 Abdomen: Nontender, morbidly obese, soft, bowel sounds positive, no rebound, no ascites, no appreciable mass Extremities: No significant cyanosis, clubbing, or edema bilateral lower extremities  CBC: Recent Labs  Lab 08/29/17 1505  WBC 21.9*  NEUTROABS 17.9*  HGB 14.0  HCT 39.8  MCV 85.4  PLT 224   Basic Metabolic Panel: Recent Labs  Lab 08/29/17 1505  NA 138  K 4.3  CL 106  CO2 24  GLUCOSE 125*  BUN 17  CREATININE 0.83  CALCIUM 8.6*   GFR: Estimated Creatinine Clearance: 194.1 mL/min (by C-G formula based on SCr of 0.83 mg/dL).  Liver Function Tests: Recent Labs  Lab 08/29/17 1505  AST 17  ALT 22  ALKPHOS 82  BILITOT 0.8  PROT 6.7  ALBUMIN 2.7*    Cardiac Enzymes: Recent Labs  Lab 08/29/17 1505  TROPONINI 0.05*    HbA1C: Hgb A1c  MFr Bld  Date/Time Value Ref Range Status  04/05/2017 04:11 AM 5.5 4.8 - 5.6 % Final    Comment:    (NOTE) Pre diabetes:          5.7%-6.4% Diabetes:              >6.4% Glycemic control for   <7.0% adults with diabetes      Recent Results (from the past 240 hour(s))  Culture, blood (Routine X 2) w Reflex to ID Panel     Status: None (Preliminary result)   Collection Time: 08/29/17  3:05 PM  Result Value Ref Range Status   Specimen Description RIGHT ANTECUBITAL  Final   Special Requests   Final    BOTTLES DRAWN AEROBIC AND ANAEROBIC Blood Culture adequate volume   Culture   Final    NO GROWTH < 24 HOURS Performed at [facility],  [address], [place], [place] [address]    Report Status PENDING  Incomplete  Culture, blood (Routine X 2) w Reflex to ID Panel     Status: None (Preliminary result)   Collection Time: 08/29/17  3:43 PM  Result Value Ref Range Status   Specimen Description LEFT ANTECUBITAL  Final   Special Requests   Final    BOTTLES DRAWN AEROBIC AND ANAEROBIC Blood Culture adequate volume   Culture   Final    NO GROWTH < 24 HOURS Performed at [facility], [address], [place], [place] [address]    Report Status PENDING  Incomplete     Scheduled Meds: Continuous Infusions: . azithromycin    . cefTRIAXone (ROCEPHIN)  IV 1 g (08/29/17 1912)  . vancomycin     Followed by  . vancomycin Stopped (08/30/17 0532)     LOS: 1 day   Lonia Blood, MD [facility] Office  [phone] Pager - Text Page per Amion as per below:  On-Call/Text Page:      [email or web address]      password [email or web address]  If 7PM-7AM, please contact night-coverage [email or web address] Password [facility] 08/30/2017, 11:23 AM

## 2017-08-30 NOTE — Transfer of Care (Signed)
Immediate Anesthesia Transfer of Care Note  Patient: Clayton Jensen  Procedure(s) Performed: VIDEO ASSISTED THORACOSCOPY (VATS)/EMPYEMA (Right Chest)  Patient Location: ICU  Anesthesia Type:General  Level of Consciousness: Patient remains intubated per anesthesia plan  Airway & Oxygen Therapy: Patient placed on Ventilator (see vital sign flow sheet for setting)  Post-op Assessment: Report given to RN and Post -op Vital signs reviewed and stable  Post vital signs: Reviewed and stable  Last Vitals:  Vitals Value Taken Time  BP    Temp    Pulse 98 08/30/2017  4:26 PM  Resp 29 08/30/2017  4:26 PM  SpO2 90 % 08/30/2017  4:26 PM  Vitals shown include unvalidated device data.  Last Pain:  Vitals:   08/30/17 0902  TempSrc: Oral  PainSc:          Complications: No apparent anesthesia complications

## 2017-08-30 NOTE — Progress Notes (Signed)
Will see him in the morning. For now recommend vancomycin plus unasyn for empiric coverage of empyema  Dakwon Wenberg B. Drue Second MD MPH [facility] [phone]

## 2017-08-30 NOTE — Anesthesia Preprocedure Evaluation (Addendum)
Anesthesia Evaluation  Patient identified by MRN, date of birth, ID band Patient awake    Reviewed: Allergy & Precautions, H&P , NPO status , Patient's Chart, lab work & pertinent test results  Airway Mallampati: I  TM Distance: >3 FB Neck ROM: Full    Dental no notable dental hx. (+) Teeth Intact, Dental Advisory Given   Pulmonary sleep apnea , Current Smoker,    Pulmonary exam normal breath sounds clear to auscultation       Cardiovascular negative cardio ROS   Rhythm:Regular Rate:Normal     Neuro/Psych negative neurological ROS  negative psych ROS   GI/Hepatic negative GI ROS, Neg liver ROS,   Endo/Other  Morbid obesity  Renal/GU negative Renal ROS  negative genitourinary   Musculoskeletal   Abdominal   Peds  Hematology negative hematology ROS (+)   Anesthesia Other Findings   Reproductive/Obstetrics negative OB ROS                            Anesthesia Physical Anesthesia Plan  ASA: III  Anesthesia Plan: General   Post-op Pain Management:    Induction: Intravenous  PONV Risk Score and Plan: 2 and Ondansetron, Midazolam and Dexamethasone  Airway Management Planned: Double Lumen EBT  Additional Equipment: Arterial line  Intra-op Plan:   Post-operative Plan: Extubation in OR and Possible Post-op intubation/ventilation  Informed Consent: I have reviewed the patients History and Physical, chart, labs and discussed the procedure including the risks, benefits and alternatives for the proposed anesthesia with the patient or authorized representative who has indicated his/her understanding and acceptance.   Dental advisory given  Plan Discussed with: CRNA  Anesthesia Plan Comments:        Anesthesia Quick Evaluation

## 2017-08-30 NOTE — Brief Op Note (Signed)
08/29/2017 - 08/30/2017  3:47 PM  PATIENT:  Clayton Jensen  37 y.o. male  PRE-OPERATIVE DIAGNOSIS:  Right empyema  POST-OPERATIVE DIAGNOSIS:  Right empyema  PROCEDURE:  Procedure(s): Right thoracotomy for drainage of empyema and decortication of lung.  SURGEON:  Surgeon(s) and Role:    * Tilak Oakley, Payton Doughty, MD - Primary  PHYSICIAN ASSISTANT: Doree Fudge, PA-C    ANESTHESIA:   general  EBL:  200 mL   BLOOD ADMINISTERED:none  DRAINS: two 27F chest tubes and one 72F Blake   LOCAL MEDICATIONS USED:  NONE  SPECIMEN:  Source of Specimen:  right pleural fluid and pleural peel  DISPOSITION OF SPECIMEN:  micro  COUNTS:  YES  TOURNIQUET:  * No tourniquets in log *  DICTATION: .Note written in EPIC  PLAN OF CARE: Admit to inpatient   PATIENT DISPOSITION:  ICU - intubated and hemodynamically stable.   Delay start of Pharmacological VTE agent (>24hrs) due to surgical blood loss or risk of bleeding: yes

## 2017-08-30 NOTE — Anesthesia Procedure Notes (Signed)
Arterial Line Insertion Start/End5/28/2019 2:10 PM, 08/30/2017 2:13 PM Performed by: Gaynelle Adu, MD, anesthesiologist  Patient location: OR. Preanesthetic checklist: patient identified, IV checked, site marked, risks and benefits discussed, surgical consent, monitors and equipment checked, pre-op evaluation, timeout performed and anesthesia consent Left, radial was placed Catheter size: 20 G Hand hygiene performed , maximum sterile barriers used  and Seldinger technique used  Attempts: 1 Procedure performed without using ultrasound guided technique. Following insertion, dressing applied and Biopatch. Post procedure assessment: normal  Patient tolerated the procedure well with no immediate complications.

## 2017-08-30 NOTE — Consult Note (Signed)
[address]       [place] [address]             [phone]      Cardiothoracic Surgery Consultation  Reason for Consult: Large loculated right pleural effusion Referring Physician: Dr. Margaretha Sheffield Clayton Jensen is an 37 y.o. male.  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 He reports twisting to put his child down and had sudden right chest pain. He thought he pulled a muscle or broke a rib because he thought that he heard a pop. He had a lot of pain and difficulty breathing due to pain and went to an Urgent Care the next day. CXR was normal and he was told that it was costochondritis and put on NSAIDs and prednisone. He continued to have a lot of pain and difficulty breathing and then started having fever on Sunday. He went back to the Urgent Care and a CXR showed a large right pleural effusion. He went to the ER at [facility] and chest CT showed a large loculated right pleural effusion consistent with empyema. He was therefore sent to [facility] for further treatment. His PCT was 0.46, Lactic acid 1.12, WBC 21.9 and Hgb 14. Trop I was 0.05. ECG showed sinus tach with no acute changes.  Past Medical History:  Diagnosis Date  . Chronic lower back pain    "fell off roof in ~ 2003; disc fused itself"  . History of kidney stones   . OSA (obstructive sleep apnea)    clinical diagnosis, not officially diagnosed per family    Past Surgical History:  Procedure Laterality Date  . KNEE ARTHROSCOPY Left     Family History  Problem Relation Age of Onset  . COPD Mother   . Atrial fibrillation Mother   . Diabetes Mother   . Diabetes Father     Social History:  reports that he has been smoking cigarettes.  He has a 17.25 pack-year smoking history. He has never used smokeless tobacco. He reports that he drinks alcohol. He  reports that he does not use drugs.  Allergies:  Allergies  Allergen Reactions  . Celery Oil Shortness Of Breath and Swelling    Swelling of throat    Medications:  I have reviewed the patient's current medications. Prior to Admission:  Medications Prior to Admission  Medication Sig Dispense Refill Last Dose  . acetaminophen (TYLENOL) 500 MG tablet Take 500 mg by mouth every 6 (six) hours as needed for mild pain.   08/28/2017 at Unknown time  . cetirizine (ZYRTEC) 10 MG tablet Take 10 mg by mouth once as needed for allergies.   08/28/2017 at Unknown time  . ketorolac (TORADOL) 10 MG tablet Take 10 mg by mouth every 6 (six) hours as needed for moderate pain.   08/29/2017 at Unknown time  . methocarbamol (ROBAXIN) 500 MG tablet Take 500 mg by mouth every 6 (six) hours as needed for muscle spasms.   08/29/2017 at Unknown time  . predniSONE (DELTASONE) 10 MG tablet Take 10 mg by mouth 2 (two) times daily with a meal. 5 day course   08/29/2017 at Unknown time   Scheduled:  Continuous: . sodium chloride    . [MAR Hold] azithromycin    . [MAR Hold] cefTRIAXone (ROCEPHIN)  IV  1 g (08/29/17 1912)  . lactated ringers 10 mL/hr at 08/30/17 1306  . [MAR Hold] vancomycin     Followed by  . [MAR Hold] vancomycin Stopped (08/30/17 0532)   PRN:0.9 % irrigation (POUR BTL), [MAR Hold] acetaminophen, [MAR Hold] ibuprofen, [MAR Hold] methocarbamol, [MAR Hold] oxyCODONE   Results for orders placed or performed during the hospital encounter of 08/29/17 (from the past 48 hour(s))  CBC with Differential/Platelet     Status: Abnormal   Collection Time: 08/29/17  3:05 PM  Result Value Ref Range   WBC 21.9 (H) 4.0 - 10.5 K/uL   RBC 4.66 4.22 - 5.81 MIL/uL   Hemoglobin 14.0 13.0 - 17.0 g/dL   HCT 39.8 39.0 - 52.0 %   MCV 85.4 78.0 - 100.0 fL   MCH 30.0 26.0 - 34.0 pg   MCHC 35.2 30.0 - 36.0 g/dL   RDW 13.1 11.5 - 15.5 %   Platelets 224 150 - 400 K/uL   Neutrophils Relative % 82 %   Neutro Abs 17.9 (H)  1.7 - 7.7 K/uL   Lymphocytes Relative 9 %   Lymphs Abs 1.9 0.7 - 4.0 K/uL   Monocytes Relative 9 %   Monocytes Absolute 2.1 (H) 0.1 - 1.0 K/uL   Eosinophils Relative 0 %   Eosinophils Absolute 0.0 0.0 - 0.7 K/uL   Basophils Relative 0 %   Basophils Absolute 0.0 0.0 - 0.1 K/uL    Comment: Performed at [facility], [address] [place], [place] [address]  Comprehensive metabolic panel     Status: Abnormal   Collection Time: 08/29/17  3:05 PM  Result Value Ref Range   Sodium 138 135 - 145 mmol/L   Potassium 4.3 3.5 - 5.1 mmol/L   Chloride 106 101 - 111 mmol/L   CO2 24 22 - 32 mmol/L   Glucose, Bld 125 (H) 65 - 99 mg/dL   BUN 17 6 - 20 mg/dL   Creatinine, Ser 0.83 0.61 - 1.24 mg/dL   Calcium 8.6 (L) 8.9 - 10.3 mg/dL   Total Protein 6.7 6.5 - 8.1 g/dL   Albumin 2.7 (L) 3.5 - 5.0 g/dL   AST 17 15 - 41 U/L   ALT 22 17 - 63 U/L   Alkaline Phosphatase 82 38 - 126 U/L   Total Bilirubin 0.8 0.3 - 1.2 mg/dL   GFR calc non Af Amer >60 >60 mL/min   GFR calc Af Amer >60 >60 mL/min    Comment: (NOTE) The eGFR has been calculated using the CKD EPI equation. This calculation has not been validated in all clinical situations. eGFR's persistently <60 mL/min signify possible Chronic Kidney Disease.    Anion gap 8 5 - 15    Comment: Performed at [facility], [address] [place], [place] [address]  Brain natriuretic peptide     Status: Abnormal   Collection Time: 08/29/17  3:05 PM  Result Value Ref Range   B Natriuretic Peptide 143.0 (H) 0.0 - 100.0 pg/mL    Comment: Performed at [facility], [address], [place], [place] [address]  Troponin I     Status: Abnormal   Collection Time: 08/29/17  3:05 PM  Result Value Ref Range   Troponin I 0.05 (HH) <0.03 ng/mL    Comment: CRITICAL RESULT CALLED TO, READ BACK BY AND VERIFIED WITH: E.WILEY, RN @ 1600 ON 5.27.19 BY L.BOWMAN Performed at [facility], [address], [place], [place] [address]   Culture, blood (Routine X 2) w  Reflex to  ID Panel     Status: None (Preliminary result)   Collection Time: 08/29/17  3:05 PM  Result Value Ref Range   Specimen Description RIGHT ANTECUBITAL    Special Requests      BOTTLES DRAWN AEROBIC AND ANAEROBIC Blood Culture adequate volume   Culture      NO GROWTH < 24 HOURS Performed at [facility], [address] [place], [place] [address]    Report Status PENDING   I-Stat CG4 Lactic Acid, ED     Status: None   Collection Time: 08/29/17  3:13 PM  Result Value Ref Range   Lactic Acid, Venous 1.12 0.5 - 1.9 mmol/L  Culture, blood (Routine X 2) w Reflex to ID Panel     Status: None (Preliminary result)   Collection Time: 08/29/17  3:43 PM  Result Value Ref Range   Specimen Description LEFT ANTECUBITAL    Special Requests      BOTTLES DRAWN AEROBIC AND ANAEROBIC Blood Culture adequate volume   Culture      NO GROWTH < 24 HOURS Performed at [facility], [address], [place], [place] [address]    Report Status PENDING   Procalcitonin - Baseline     Status: None   Collection Time: 08/29/17  6:08 PM  Result Value Ref Range   Procalcitonin 0.46 ng/mL    Comment:        Interpretation: PCT (Procalcitonin) <= 0.5 ng/mL: Systemic infection (sepsis) is not likely. Local bacterial infection is possible. (NOTE)       Sepsis PCT Algorithm           Lower Respiratory Tract                                      Infection PCT Algorithm    ----------------------------     ----------------------------         PCT < 0.25 ng/mL                PCT < 0.10 ng/mL         Strongly encourage             Strongly discourage   discontinuation of antibiotics    initiation of antibiotics    ----------------------------     -----------------------------       PCT 0.25 - 0.50 ng/mL            PCT 0.10 - 0.25 ng/mL               OR       >80% decrease in PCT            Discourage initiation of                                            antibiotics      Encourage discontinuation           of  antibiotics    ----------------------------     -----------------------------         PCT >= 0.50 ng/mL              PCT 0.26 - 0.50 ng/mL               AND        <80%  decrease in PCT             Encourage initiation of                                             antibiotics       Encourage continuation           of antibiotics    ----------------------------     -----------------------------        PCT >= 0.50 ng/mL                  PCT > 0.50 ng/mL               AND         increase in PCT                  Strongly encourage                                      initiation of antibiotics    Strongly encourage escalation           of antibiotics                                     -----------------------------                                           PCT <= 0.25 ng/mL                                                 OR                                        > 80% decrease in PCT                                     Discontinue / Do not initiate                                             antibiotics Performed at [facility], [address], [place], [place] [address]   Strep pneumoniae urinary antigen     Status: None   Collection Time: 08/29/17 10:54 PM  Result Value Ref Range   Strep Pneumo Urinary Antigen NEGATIVE NEGATIVE    Comment:        Infection due to S. pneumoniae cannot be absolutely ruled out since the antigen present may be below the detection limit of the test. Performed at [facility], [address], [place], [place] [address]   Surgical pcr screen     Status: None   Collection Time: 08/30/17 10:51 AM  Result Value  Ref Range   MRSA, PCR NEGATIVE NEGATIVE   Staphylococcus aureus NEGATIVE NEGATIVE    Comment: (NOTE) The Xpert SA Assay (FDA approved for NASAL specimens in patients 15 years of age and older), is one component of a comprehensive surveillance program. It is not intended to diagnose infection nor to guide or monitor treatment. Performed  at [facility], [place] [address] [place], [place] [address]   Type and screen [place]     Status: None   Collection Time: 08/30/17  1:05 PM  Result Value Ref Range   ABO/RH(D) O POS    Antibody Screen NEG    Sample Expiration      09/02/2017 Performed at [facility], [place] [address], [place], [place] [address]   ABO/Rh     Status: None   Collection Time: 08/30/17  1:05 PM  Result Value Ref Range   ABO/RH(D)      O POS Performed at [place] [address], [place], [place] [address]     Ct Angio Chest Pe W And/or Wo Contrast  Result Date: 08/29/2017 CLINICAL DATA:  Worsening chest x-ray, EXAM: CT ANGIOGRAPHY CHEST WITH CONTRAST TECHNIQUE: Multidetector CT imaging of the chest was performed using the standard protocol during bolus administration of intravenous contrast. Multiplanar CT image reconstructions and MIPs were obtained to evaluate the vascular anatomy. CONTRAST:  129m ISOVUE-370 IOPAMIDOL (ISOVUE-370) INJECTION 76% COMPARISON:  None. FINDINGS: Cardiovascular: Suboptimal opacification of the pulmonary arteries. No large central pulmonary embolus, but evaluation of the lobar and segmental branches is limited secondary to suboptimal contrast opacification. Normal heart size. No pericardial effusion. Normal caliber thoracic aorta. Mediastinum/Nodes: No enlarged mediastinal, hilar, or axillary lymph nodes. Thyroid gland, trachea, and esophagus demonstrate no significant findings. Lungs/Pleura: Large partial loculated right pleural effusion with compressive atelectasis with only a small area of aerated lung in the upper chest. Left lung is clear. No pneumothorax. Upper Abdomen: No acute upper abdominal abnormality. Low attenuation of the liver as can be seen with hepatic steatosis. Musculoskeletal: No acute osseous abnormality. No aggressive osseous lesion.Broad-based disc osteophyte complex at T11-12. Review of the MIP images confirms the above  findings. IMPRESSION: 1. Suboptimal opacification of the pulmonary arteries. No large central pulmonary embolus, but evaluation of the lobar and segmental branches is limited secondary to suboptimal contrast opacification. 2. Large partial loculated right pleural effusion with compressive atelectasis with only a small area of aerated lung in the upper chest. Electronically Signed   By: HKathreen Devoid  On: 08/29/2017 16:40    Review of Systems  Constitutional: Positive for fever and malaise/fatigue.  HENT: Negative.   Eyes: Negative.   Respiratory: Positive for cough and shortness of breath. Negative for hemoptysis and sputum production.   Cardiovascular: Positive for chest pain and orthopnea. Negative for palpitations and leg swelling.       Right side  Gastrointestinal: Negative.   Genitourinary: Negative.   Musculoskeletal: Positive for back pain.  Skin: Negative.   Neurological: Negative.   Endo/Heme/Allergies: Negative.   Psychiatric/Behavioral: Negative.    Blood pressure (!) 147/71, pulse (!) 102, temperature 99 F (37.2 C), temperature source Oral, resp. rate (!) 28, height 6' (1.829 m), weight (!) 167.9 kg (370 lb 2.4 oz), SpO2 97 %. Physical Exam  Constitutional: He is oriented to person, place, and time.  Morbidly obese, looks uncomfortable   HENT:  Head: Normocephalic and atraumatic.  Mouth/Throat: Oropharynx is clear and moist.  Eyes: Pupils  are equal, round, and reactive to light. EOM are normal. No scleral icterus.  Cardiovascular: Normal rate, regular rhythm and normal heart sounds.  No murmur heard. Respiratory: Effort normal. He exhibits no tenderness.  Minimal BS on the right  Musculoskeletal: He exhibits no edema.  Lymphadenopathy:    He has no cervical adenopathy.  Neurological: He is alert and oriented to person, place, and time.  Skin: Skin is warm and dry.  Psychiatric: He has a normal mood and affect.   CLINICAL DATA:  Worsening chest x-ray,  EXAM: CT  ANGIOGRAPHY CHEST WITH CONTRAST  TECHNIQUE: Multidetector CT imaging of the chest was performed using the standard protocol during bolus administration of intravenous contrast. Multiplanar CT image reconstructions and MIPs were obtained to evaluate the vascular anatomy.  CONTRAST:  162m ISOVUE-370 IOPAMIDOL (ISOVUE-370) INJECTION 76%  COMPARISON:  None.  FINDINGS: Cardiovascular: Suboptimal opacification of the pulmonary arteries. No large central pulmonary embolus, but evaluation of the lobar and segmental branches is limited secondary to suboptimal contrast opacification. Normal heart size. No pericardial effusion. Normal caliber thoracic aorta.  Mediastinum/Nodes: No enlarged mediastinal, hilar, or axillary lymph nodes. Thyroid gland, trachea, and esophagus demonstrate no significant findings.  Lungs/Pleura: Large partial loculated right pleural effusion with compressive atelectasis with only a small area of aerated lung in the upper chest. Left lung is clear. No pneumothorax.  Upper Abdomen: No acute upper abdominal abnormality. Low attenuation of the liver as can be seen with hepatic steatosis.  Musculoskeletal: No acute osseous abnormality. No aggressive osseous lesion.Broad-based disc osteophyte complex at T11-12.  Review of the MIP images confirms the above findings.  IMPRESSION: 1. Suboptimal opacification of the pulmonary arteries. No large central pulmonary embolus, but evaluation of the lobar and segmental branches is limited secondary to suboptimal contrast opacification. 2. Large partial loculated right pleural effusion with compressive atelectasis with only a small area of aerated lung in the upper chest.   Electronically Signed   By: HKathreen Devoid  On: 08/29/2017 16:40  Assessment/Plan:  This 37year old gentleman presents with fairly sudden onset of right chest pain and shortness of breath with his initial chest x-ray the next day being  unremarkable.  He was treated as if this was costochondritis with prednisone and nonsteroidal anti-inflammatory agents but his symptoms persisted and progress and a follow-up chest x-ray showed a large right pleural effusion.  CT of the chest shows a large loculated right pleural effusion suggesting empyema.  He has low-grade fever and leukocytosis.  This will require operative drainage most likely with right thoracotomy for possibly thoracoscopy. I discussed the operative procedure with the patient and wife including alternatives, benefits and risks; including but not limited to bleeding, blood transfusion, infection, prolonged air leak, respiratory failure.  Clayton Droneunderstands and agrees to proceed.    I spent 60 minutes performing this consultation and > 50% of this time was spent face to face counseling and coordinating the care of this patient's right empyema.  BGaye Pollack5/28/2019, 2:06 PM

## 2017-08-30 NOTE — Anesthesia Postprocedure Evaluation (Signed)
Anesthesia Post Note  Patient: Clayton Jensen  Procedure(s) Performed: VIDEO ASSISTED THORACOSCOPY (VATS)/EMPYEMA (Right Chest)     Patient location during evaluation: SICU Anesthesia Type: General Level of consciousness: sedated Pain management: pain level controlled Vital Signs Assessment: post-procedure vital signs reviewed and stable Respiratory status: patient remains intubated per anesthesia plan Cardiovascular status: stable Postop Assessment: no apparent nausea or vomiting Anesthetic complications: no    Last Vitals:  Vitals:   08/30/17 1237 08/30/17 1630  BP: (!) 147/71 (!) 173/82  Pulse: (!) 102   Resp: (!) 28   Temp:    SpO2: 97% 94%    Last Pain:  Vitals:   08/30/17 0902  TempSrc: Oral  PainSc:                  Cylinda Santoli,W. EDMOND

## 2017-08-31 ENCOUNTER — Encounter (HOSPITAL_COMMUNITY): Payer: Self-pay | Admitting: Surgery

## 2017-08-31 ENCOUNTER — Inpatient Hospital Stay (HOSPITAL_COMMUNITY): Payer: 59

## 2017-08-31 DIAGNOSIS — J869 Pyothorax without fistula: Secondary | ICD-10-CM

## 2017-08-31 DIAGNOSIS — J90 Pleural effusion, not elsewhere classified: Secondary | ICD-10-CM

## 2017-08-31 DIAGNOSIS — E662 Morbid (severe) obesity with alveolar hypoventilation: Secondary | ICD-10-CM

## 2017-08-31 DIAGNOSIS — J189 Pneumonia, unspecified organism: Secondary | ICD-10-CM

## 2017-08-31 DIAGNOSIS — G4733 Obstructive sleep apnea (adult) (pediatric): Secondary | ICD-10-CM

## 2017-08-31 DIAGNOSIS — K089 Disorder of teeth and supporting structures, unspecified: Secondary | ICD-10-CM

## 2017-08-31 DIAGNOSIS — Z6841 Body Mass Index (BMI) 40.0 and over, adult: Secondary | ICD-10-CM

## 2017-08-31 LAB — BASIC METABOLIC PANEL
Anion gap: 8 (ref 5–15)
BUN: 16 mg/dL (ref 6–20)
CHLORIDE: 112 mmol/L — AB (ref 101–111)
CO2: 23 mmol/L (ref 22–32)
CREATININE: 0.86 mg/dL (ref 0.61–1.24)
Calcium: 7.9 mg/dL — ABNORMAL LOW (ref 8.9–10.3)
GFR calc Af Amer: >60 mL/min (ref >60)
GFR calc non Af Amer: >60 mL/min (ref >60)
GLUCOSE: 188 mg/dL — AB (ref 65–99)
Potassium: 5.3 mmol/L — ABNORMAL HIGH (ref 3.5–5.1)
Sodium: 143 mmol/L (ref 135–145)

## 2017-08-31 LAB — BLOOD GAS, ARTERIAL
ACID-BASE DEFICIT: 1.7 mmol/L (ref 0.0–2.0)
BICARBONATE: 23 mmol/L (ref 20.0–28.0)
Drawn by: 270271
FIO2: 70
LHR: 16 {breaths}/min
MECHVT: 620 mL
O2 Saturation: 98.5 %
PEEP/CPAP: 5 cmH2O
PH ART: 7.354 (ref 7.350–7.450)
Patient temperature: 99
pCO2 arterial: 42.5 mmHg (ref 32.0–48.0)
pO2, Arterial: 143 mmHg — ABNORMAL HIGH (ref 83.0–108.0)

## 2017-08-31 LAB — GLUCOSE, CAPILLARY
GLUCOSE-CAPILLARY: 171 mg/dL — AB (ref 65–99)
GLUCOSE-CAPILLARY: 125 mg/dL — AB (ref 65–99)
Glucose-Capillary: 202 mg/dL — ABNORMAL HIGH (ref 65–99)
Glucose-Capillary: 175 mg/dL — ABNORMAL HIGH (ref 65–99)
Glucose-Capillary: 204 mg/dL — ABNORMAL HIGH (ref 65–99)
Glucose-Capillary: 132 mg/dL — ABNORMAL HIGH (ref 65–99)

## 2017-08-31 LAB — POCT I-STAT 3, ART BLOOD GAS (G3+)
Acid-base deficit: 3 mmol/L — ABNORMAL HIGH (ref 0.0–2.0)
BICARBONATE: 21.5 mmol/L (ref 20.0–28.0)
O2 SAT: 98 %
PCO2 ART: 37.7 mmHg (ref 32.0–48.0)
PH ART: 7.365 (ref 7.350–7.450)
PO2 ART: 103 mmHg (ref 83.0–108.0)
Patient temperature: 99
TCO2: 23 mmol/L (ref 22–32)

## 2017-08-31 LAB — CBC
HEMATOCRIT: 39.1 % (ref 39.0–52.0)
Hemoglobin: 13.1 g/dL (ref 13.0–17.0)
MCH: 29.2 pg (ref 26.0–34.0)
MCHC: 33.5 g/dL (ref 30.0–36.0)
MCV: 87.3 fL (ref 78.0–100.0)
Platelets: 227 10*3/uL (ref 150–400)
RBC: 4.48 MIL/uL (ref 4.22–5.81)
RDW: 13.2 % (ref 11.5–15.5)
WBC: 17.5 10*3/uL — AB (ref 4.0–10.5)

## 2017-08-31 LAB — POCT I-STAT 3, VENOUS BLOOD GAS (G3P V)
ACID-BASE DEFICIT: 9 mmol/L — AB (ref 0.0–2.0)
BICARBONATE: 15.8 mmol/L — AB (ref 20.0–28.0)
O2 SAT: 95 %
PO2 VEN: 75 mmHg — AB (ref 32.0–45.0)
TCO2: 17 mmol/L — AB (ref 22–32)
pCO2, Ven: 27.6 mmHg — ABNORMAL LOW (ref 44.0–60.0)
pH, Ven: 7.363 (ref 7.250–7.430)

## 2017-08-31 LAB — HIV ANTIBODY (ROUTINE TESTING W REFLEX): HIV SCREEN 4TH GENERATION: NONREACTIVE

## 2017-08-31 LAB — ACID FAST SMEAR (AFB, MYCOBACTERIA)
Acid Fast Smear: NEGATIVE
Acid Fast Smear: NEGATIVE

## 2017-08-31 LAB — ABO/RH: ABO/RH(D): O POS

## 2017-08-31 LAB — VANCOMYCIN, TROUGH: Vancomycin Tr: 14 ug/mL — ABNORMAL LOW (ref 15–20)

## 2017-08-31 LAB — ACID FAST SMEAR (AFB)

## 2017-08-31 LAB — MAGNESIUM: Magnesium: 2.3 mg/dL (ref 1.7–2.4)

## 2017-08-31 MED ORDER — FAMOTIDINE IN NACL 20-0.9 MG/50ML-% IV SOLN
20.0000 mg | Freq: Two times a day (BID) | INTRAVENOUS | Status: DC
  Administered 2017-08-31 (×2): 20 mg via INTRAVENOUS
  Filled 2017-08-31 (×2): qty 50

## 2017-08-31 MED ORDER — SODIUM CHLORIDE 0.9% FLUSH: 9.0000 mL | INTRAVENOUS | Status: DC | PRN

## 2017-08-31 MED ORDER — OXYCODONE HCL 5 MG PO TABS
10.0000 mg | ORAL_TABLET | ORAL | Status: DC | PRN
  Administered 2017-09-01 – 2017-09-05 (×10): 10 mg via ORAL
  Filled 2017-08-31 (×10): qty 2

## 2017-08-31 MED ORDER — DIPHENHYDRAMINE HCL 50 MG/ML IJ SOLN: 12.5000 mg | Freq: Four times a day (QID) | INTRAMUSCULAR | Status: DC | PRN

## 2017-08-31 MED ORDER — KETOROLAC TROMETHAMINE 15 MG/ML IJ SOLN
15.0000 mg | Freq: Four times a day (QID) | INTRAMUSCULAR | Status: AC | PRN
  Administered 2017-08-31 – 2017-09-05 (×16): 15 mg via INTRAVENOUS
  Filled 2017-08-31 (×17): qty 1

## 2017-08-31 MED ORDER — ORAL CARE MOUTH RINSE
15.0000 mL | OROMUCOSAL | Status: DC
  Administered 2017-08-31 (×3): 15 mL via OROMUCOSAL

## 2017-08-31 MED ORDER — INSULIN DETEMIR 100 UNIT/ML ~~LOC~~ SOLN
15.0000 [IU] | Freq: Two times a day (BID) | SUBCUTANEOUS | Status: DC
  Administered 2017-08-31 (×2): 15 [IU] via SUBCUTANEOUS
  Filled 2017-08-31 (×3): qty 0.15

## 2017-08-31 MED ORDER — CHLORHEXIDINE GLUCONATE 0.12% ORAL RINSE (MEDLINE KIT)
15.0000 mL | Freq: Two times a day (BID) | OROMUCOSAL | Status: DC
  Administered 2017-08-31 – 2017-09-02 (×3): 15 mL via OROMUCOSAL

## 2017-08-31 MED ORDER — INSULIN ASPART 100 UNIT/ML ~~LOC~~ SOLN
0.0000 [IU] | Freq: Three times a day (TID) | SUBCUTANEOUS | Status: DC
  Administered 2017-09-01: 2 [IU] via SUBCUTANEOUS

## 2017-08-31 MED ORDER — LUNG SURGERY BOOK
Freq: Once | Status: AC
  Administered 2017-08-31: 05:00:00
  Filled 2017-08-31: qty 1

## 2017-08-31 MED ORDER — DIPHENHYDRAMINE HCL 12.5 MG/5ML PO ELIX: 12.5000 mg | ORAL_SOLUTION | Freq: Four times a day (QID) | ORAL | Status: DC | PRN

## 2017-08-31 MED ORDER — NALOXONE HCL 0.4 MG/ML IJ SOLN: 0.4000 mg | INTRAMUSCULAR | Status: DC | PRN

## 2017-08-31 MED ORDER — ONDANSETRON HCL 4 MG/2ML IJ SOLN: 4.0000 mg | Freq: Four times a day (QID) | INTRAMUSCULAR | Status: DC | PRN

## 2017-08-31 MED ORDER — MORPHINE SULFATE 2 MG/ML IV SOLN
INTRAVENOUS | Status: DC
  Administered 2017-08-31: 4.5 mg via INTRAVENOUS
  Administered 2017-08-31: 12:00:00 via INTRAVENOUS
  Administered 2017-08-31: 9 mg via INTRAVENOUS
  Administered 2017-09-01: 7.5 mg via INTRAVENOUS
  Administered 2017-09-01: 2 mg via INTRAVENOUS
  Administered 2017-09-01: 1.5 mg via INTRAVENOUS
  Administered 2017-09-01: 6 mg via INTRAVENOUS
  Administered 2017-09-01: 21:00:00 via INTRAVENOUS
  Administered 2017-09-02: 2 mg via INTRAVENOUS
  Administered 2017-09-02: 5 mg via INTRAVENOUS
  Administered 2017-09-02: 6 mg via INTRAVENOUS
  Administered 2017-09-02: 1.5 mg via INTRAVENOUS
  Administered 2017-09-02: 10.5 mg via INTRAVENOUS
  Administered 2017-09-03: 6 mg via INTRAVENOUS
  Filled 2017-08-31 (×2): qty 30

## 2017-08-31 MED ORDER — VANCOMYCIN HCL 10 G IV SOLR
1500.0000 mg | Freq: Three times a day (TID) | INTRAVENOUS | Status: DC
  Administered 2017-08-31 – 2017-09-02 (×5): 1500 mg via INTRAVENOUS
  Filled 2017-08-31 (×6): qty 1500

## 2017-08-31 MED ORDER — DEXTROSE-NACL 5-0.9 % IV SOLN
INTRAVENOUS | Status: DC
  Administered 2017-09-01: 15:00:00 via INTRAVENOUS

## 2017-08-31 NOTE — Progress Notes (Signed)
MD VanTrigt paged to receive order to use OG tube based on radiology report from ABD view. Received verbal order to place OG to continuous low suction. OG now to  continuous low suction. MD deferred placing order to administer liquids/crused meds down tube due to dilated bowel. Patient is diaphoretic and temp was 100.4 with a high of 101 at 2100 , tylenol was not given due to MD request to hold liquid admin, IV tylenol not ordered. Ice packs were applied to patient and a fan placed near patient.RN will continue to monitor.

## 2017-08-31 NOTE — Progress Notes (Signed)
Received verbal order from MD Donata Clay to keep patient sedated and intubated overnight.

## 2017-08-31 NOTE — Progress Notes (Signed)
1 Day Post-Op Procedure(s) (LRB): VIDEO ASSISTED THORACOSCOPY (VATS)/EMPYEMA (Right) Subjective: Intubated and sedated  Objective: Vital signs in last 24 hours: Temp:  [98.8 F (37.1 C)-101 F (38.3 C)] 98.8 F (37.1 C) (05/29 0630) Pulse Rate:  [38-115] 61 (05/29 0748) Cardiac Rhythm: Normal sinus rhythm (05/29 0300) Resp:  [0-39] 16 (05/29 0748) BP: (63-173)/(32-84) 96/48 (05/29 0748) SpO2:  [85 %-100 %] 99 % (05/29 0748) Arterial Line BP: (87-159)/(43-99) 111/99 (05/29 0700) FiO2 (%):  [50 %-70 %] 50 % (05/29 0748)  Hemodynamic parameters for last 24 hours:    Intake/Output from previous day: 05/28 0701 - 05/29 0700 In: 6224.6 [I.V.:4234.6; IV Piggyback:1500] Out: 2065 [Urine:1260; Blood:200; Chest Tube:605] Intake/Output this shift: No intake/output data recorded.  General appearance: intubated and sedated, calm. Heart: regular rate and rhythm, S1, S2 normal, no murmur, click, rub or gallop Lungs: clear to auscultation bilaterally chest tube output low, serous.  Lab Results: Recent Labs    08/29/17 1505 08/30/17 1447 08/31/17 0321  WBC 21.9*  --  17.5*  HGB 14.0 14.3 13.1  HCT 39.8 42.0 39.1  PLT 224  --  227   BMET:  Recent Labs    08/29/17 1505 08/30/17 1447 08/31/17 0321  NA 138 140 143  K 4.3 4.6 5.3*  CL 106  --  112*  CO2 24  --  23  GLUCOSE 125*  --  188*  BUN 17  --  16  CREATININE 0.83  --  0.86  CALCIUM 8.6*  --  7.9*    PT/INR: No results for input(s): LABPROT, INR in the last 72 hours. ABG    Component Value Date/Time   PHART 7.354 08/31/2017 0420   HCO3 23.0 08/31/2017 0420   TCO2 25 08/30/2017 2256   ACIDBASEDEF 1.7 08/31/2017 0420   O2SAT 98.5 08/31/2017 0420   CBG (last 3)  Recent Labs    08/30/17 2017 08/31/17 0047 08/31/17 0329  GLUCAP 207* 202* 175*   CXR: stable appearance of right lung as expected after empyema drainage and decortication.  Assessment/Plan:  POD 1 s/p right thoracotomy for drainage of empyema  and decortication of right lung.  ABG ok this am on 70% FiO2 so will wean oxygen and then vent this am. Hopefully extubate.  Continue antibiotics for empyema as directed by ID. Follow up on cultures. Tylenol as needed for fever.   Hyperglycemia: will add Levemir and continue SSI.   Lovenox and SCD's for DVT prophylaxis.    LOS: 2 days    Alleen Borne 08/31/2017

## 2017-08-31 NOTE — Progress Notes (Signed)
PROGRESS NOTE    Clayton Jensen  BZJ:[id number] DOB: 03-15-1981 DOA: 08/29/2017 PCP: Mikey Kirschner, MD   Brief Narrative:  37 y.o. 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  Patient reports that it started about 4 days ago when he was rocking his baby when he turned and pulled a muscle in his right side.  He immediately heard a pop and it was extremely painful and had difficulty breathing due to the pain.  The following day he went to urgent care at which point he had a chest x-ray done which was normal and was told that he had costochondritis and was started on NSAIDs and prednisone.  He went home continue to have a lot of pain and continued to have difficulty in breathing due to the pain.  Yesterday he started spiking fevers.  They went back to the urgent care today because of the fevers he had a repeat chest x-ray which showed a large right-sided effusion and he was told to go immediately to the emergency department.  He has a CT scan done today which shows a partially loculated large right-sided pleural effusion.  CT surgery at [facility] is been called who are recommending patient go to the OR to have this drained tonight or in the morning.  He denies any nausea vomiting diarrhea.  Patient given vancomycin and Zosyn in the ED and referred for admission for surgical intervention at [facility].     Subjective: 5/29/4, positive CP (right lateral chest wall) consistent with chest tube placement, negative S OB, negative abdominal pain.   Assessment & Plan:   Principal Problem:   Loculated pleural effusion Active Problems:   PNA (pneumonia)   Chronic lower back pain   Sepsis (HCC)   Empyema (HCC)  RIGHT large loculated pleural effusion/Empyema -5/28 S/P VATS 1883m purulent fluid aspirated -Multiple chest  tubes in place, draining serosanguineous fluid  -Extubated titrate patient off Precedex - Continue current antibiotic.  Cultures pending -Continue fentanyl drip for chest tube pain per cardiothoracic surgery  Sepsis pneumonia? -Unclear source of infection. -See empyema -Chest tube care per cardiothoracic surgery - Incentive spirometry  OSA/OHS -CPAP per respiratory   Morbid Obesity( Body mass index is 50.2 kg/m) -Diet currently clear liquids.  Once started on solid food would limit to 2000 cal/day diet.      DVT prophylaxis: Lovenox Code Status: Full Family Communication: Family at bedside for discussion plan of care Disposition Plan: TBD   Consultants:  TCTS     Procedures/Significant Events:  [facility] Thoracotomy/drainage empyema/decortication RIGHT lung    I have personally reviewed and interpreted all radiology studies and my findings are as above.  VENTILATOR SETTINGS:    Cultures 5/27 blood NGTD 5/28 MRSA PCR negative 5/28 RIGHT empyema NG TD 5/28 RIGHT lung tissue NGTD    Antimicrobials: Anti-infectives (From admission, onward)   Start     Stop   08/30/17 1800  ampicillin-sulbactam (UNASYN) 1.5 g in sodium chloride 0.9 % 100 mL IVPB         08/30/17 0300  vancomycin (VANCOCIN) 1,500 mg in sodium chloride 0.9 % 500 mL IVPB         08/29/17 1900  vancomycin (VANCOCIN) IVPB 1000 mg/200 mL premix     08/30/17  1555   08/29/17 1815  cefTRIAXone (ROCEPHIN) 1 g in sodium chloride 0.9 % 100 mL IVPB  Status:  Discontinued     08/30/17 1734   08/29/17 1815  azithromycin (ZITHROMAX) 500 mg in sodium chloride 0.9 % 250 mL IVPB  Status:  Discontinued     08/31/17 0924   08/29/17 1630  vancomycin (VANCOCIN) IVPB 1000 mg/200 mL premix     08/29/17 1843   08/29/17 1630  piperacillin-tazobactam (ZOSYN) IVPB 3.375 g     08/29/17 1720       Devices      LINES / TUBES:  RIGHT 36 French chest tubes 5/28>> RIGHT 32  French chest tube 5/28>>      Continuous Infusions: . ampicillin-sulbactam (UNASYN) IV Stopped (08/31/17 0527)  . azithromycin Stopped (08/30/17 2110)  . dexmedetomidine (PRECEDEX) IV infusion 1.2 mcg/kg/hr (08/31/17 0700)  . dextrose 5 % and 0.9% NaCl 125 mL/hr at 08/31/17 0600  . fentaNYL infusion INTRAVENOUS 100 mcg/hr (08/31/17 0600)  . midazolam (VERSED) infusion Stopped (08/31/17 0111)  . phenylephrine (NEO-SYNEPHRINE) Adult infusion Stopped (08/31/17 0200)  . potassium chloride    . vancomycin Stopped (08/31/17 0520)     Objective: Vitals:   08/31/17 0630 08/31/17 0645 08/31/17 0700 08/31/17 0748  BP:   (!) 94/47 (!) 96/48  Pulse: (!) 59 61 61 61  Resp: 16 20 19 16   Temp: 98.8 F (37.1 C)     TempSrc: Oral     SpO2: 99% 99% 99% 99%  Weight:      Height:        Intake/Output Summary (Last 24 hours) at 08/31/2017 0749 Last data filed at 08/31/2017 0600 Gross per 24 hour  Intake 6224.58 ml  Output 2065 ml  Net 4159.58 ml   Filed Weights   08/29/17 1414 08/29/17 2200  Weight: (!) 350 lb (158.8 kg) (!) 370 lb 2.4 oz (167.9 kg)    Examination:  General: A/O x4, No acute respiratory distress Neck:  Negative scars, masses, torticollis, lymphadenopathy, JVD Lungs: difficult to auscultate secondary to patient's body habitus, appears clear auscultation bilaterally without wheezes or crackles.  Right chest wall tenderness secondary to multiple chest tubes Cardiovascular: Regular rate and rhythm without murmur gallop or rub normal S1 and S2 Abdomen: MORBIDLY OBESE, negative abdominal pain, nondistended, positive soft, bowel sounds, no rebound, no ascites, no appreciable mass Extremities: No significant cyanosis, clubbing, or edema bilateral lower extremities Skin: Negative rashes, lesions, ulcers Psychiatric:  Negative depression, negative anxiety, negative fatigue, negative mania  Central nervous system:  Cranial nerves II through XII intact, tongue/uvula midline, all  extremities muscle strength 5/5, sensation intact throughout,  negative dysarthria, negative expressive aphasia, negative receptive aphasia.  .     Data Reviewed: Care during the described time interval was provided by me .  I have reviewed this patient's available data, including medical history, events of note, physical examination, and all test results as part of my evaluation.   CBC: Recent Labs  Lab 08/29/17 1505 08/30/17 1447 08/31/17 0321  WBC 21.9*  --  17.5*  NEUTROABS 17.9*  --   --   HGB 14.0 14.3 13.1  HCT 39.8 42.0 39.1  MCV 85.4  --  87.3  PLT 224  --  459   Basic Metabolic Panel: Recent Labs  Lab 08/29/17 1505 08/30/17 1447 08/31/17 0321  NA 138 140 143  K 4.3 4.6 5.3*  CL 106  --  112*  CO2 24  --  23  GLUCOSE 125*  --  188*  BUN 17  --  16  CREATININE 0.83  --  0.86  CALCIUM 8.6*  --  7.9*   GFR: Estimated Creatinine Clearance: 187.3 mL/min (by C-G formula based on SCr of 0.86 mg/dL). Liver Function Tests: Recent Labs  Lab 08/29/17 1505  AST 17  ALT 22  ALKPHOS 82  BILITOT 0.8  PROT 6.7  ALBUMIN 2.7*   No results for input(s): LIPASE, AMYLASE in the last 168 hours. No results for input(s): AMMONIA in the last 168 hours. Coagulation Profile: No results for input(s): INR, PROTIME in the last 168 hours. Cardiac Enzymes: Recent Labs  Lab 08/29/17 1505  TROPONINI 0.05*   BNP (last 3 results) No results for input(s): PROBNP in the last 8760 hours. HbA1C: No results for input(s): HGBA1C in the last 72 hours. CBG: Recent Labs  Lab 08/30/17 1754 08/30/17 2017 08/31/17 0047 08/31/17 0329  GLUCAP 167* 207* 202* 175*   Lipid Profile: No results for input(s): CHOL, HDL, LDLCALC, TRIG, CHOLHDL, LDLDIRECT in the last 72 hours. Thyroid Function Tests: No results for input(s): TSH, T4TOTAL, FREET4, T3FREE, THYROIDAB in the last 72 hours. Anemia Panel: No results for input(s): VITAMINB12, FOLATE, FERRITIN, TIBC, IRON, RETICCTPCT in the last  72 hours. Urine analysis:    Component Value Date/Time   COLORURINE YELLOW 04/04/2017 0832   APPEARANCEUR CLEAR 04/04/2017 0832   LABSPEC 1.017 04/04/2017 0832   PHURINE 6.0 04/04/2017 0832   GLUCOSEU NEGATIVE 04/04/2017 0832   HGBUR SMALL (A) 04/04/2017 0832   BILIRUBINUR NEGATIVE 04/04/2017 0832   KETONESUR NEGATIVE 04/04/2017 0832   PROTEINUR 100 (A) 04/04/2017 0832   UROBILINOGEN 0.2 10/17/2014 0350   NITRITE NEGATIVE 04/04/2017 0832   LEUKOCYTESUR NEGATIVE 04/04/2017 0832   Sepsis Labs: @LABRCNTIP (procalcitonin:4,lacticidven:4)  ) Recent Results (from the past 240 hour(s))  Culture, blood (Routine X 2) w Reflex to ID Panel     Status: None (Preliminary result)   Collection Time: 08/29/17  3:05 PM  Result Value Ref Range Status   Specimen Description RIGHT ANTECUBITAL  Final   Special Requests   Final    BOTTLES DRAWN AEROBIC AND ANAEROBIC Blood Culture adequate volume   Culture   Final    NO GROWTH 2 DAYS Performed at [facility], [address], [place], [place] [address]    Report Status PENDING  Incomplete  Culture, blood (Routine X 2) w Reflex to ID Panel     Status: None (Preliminary result)   Collection Time: 08/29/17  3:43 PM  Result Value Ref Range Status   Specimen Description LEFT ANTECUBITAL  Final   Special Requests   Final    BOTTLES DRAWN AEROBIC AND ANAEROBIC Blood Culture adequate volume   Culture   Final    NO GROWTH 2 DAYS Performed at [facility], [address], [place], [place] [address]    Report Status PENDING  Incomplete  Surgical pcr screen     Status: None   Collection Time: 08/30/17 10:51 AM  Result Value Ref Range Status   MRSA, PCR NEGATIVE NEGATIVE Final   Staphylococcus aureus NEGATIVE NEGATIVE Final    Comment: (NOTE) The Xpert SA Assay (FDA approved for NASAL specimens in patients 53 years of age and older), is one component of a comprehensive surveillance program. It is not intended to diagnose infection nor to guide or  monitor treatment. Performed at [facility], [place] [address] [place], [place] [address]   Aerobic/Anaerobic Culture (surgical/deep wound)     Status: None (Preliminary  result)   Collection Time: 08/30/17  2:44 PM  Result Value Ref Range Status   Specimen Description EMPYEMA RIGHT  Final   Special Requests PATIENT ON FOLLOWING VANC  Final   Gram Stain   Final    MODERATE WBC PRESENT, PREDOMINANTLY PMN NO ORGANISMS SEEN Performed at [facility], [address] [place], [place] [address]    Culture PENDING  Incomplete   Report Status PENDING  Incomplete  Aerobic/Anaerobic Culture (surgical/deep wound)     Status: None (Preliminary result)   Collection Time: 08/30/17  2:51 PM  Result Value Ref Range Status   Specimen Description TISSUE RIGHT LUNG  Final   Special Requests PATIENT ON FOLLOWING VANC  Final   Gram Stain   Final    MODERATE WBC PRESENT, PREDOMINANTLY PMN FEW GRAM POSITIVE COCCI Performed at [facility], [place] [address] [place], [place] [address]    Culture PENDING  Incomplete   Report Status PENDING  Incomplete         Radiology Studies: Dg Abd 1 View  Result Date: 08/30/2017 CLINICAL DATA:  Assess orogastric tube placement. EXAM: ABDOMEN - 1 VIEW COMPARISON:  CT of the abdomen and pelvis from 10/17/2014 FINDINGS: The patient's enteric tube is noted ending overlying the body of the stomach. The visualized bowel gas pattern is nonspecific, with distention of the colon with air. No free intra-abdominal air is seen, though evaluation for free air is limited on a single supine view. No acute osseous abnormalities are identified. IMPRESSION: Enteric tube noted ending overlying the body of the stomach. Electronically Signed   By: Garald Balding M.D.   On: 08/30/2017 23:10   Ct Angio Chest Pe W And/or Wo Contrast  Result Date: 08/29/2017 CLINICAL DATA:  Worsening chest x-ray, EXAM: CT ANGIOGRAPHY CHEST WITH CONTRAST TECHNIQUE: Multidetector CT imaging  of the chest was performed using the standard protocol during bolus administration of intravenous contrast. Multiplanar CT image reconstructions and MIPs were obtained to evaluate the vascular anatomy. CONTRAST:  148m ISOVUE-370 IOPAMIDOL (ISOVUE-370) INJECTION 76% COMPARISON:  None. FINDINGS: Cardiovascular: Suboptimal opacification of the pulmonary arteries. No large central pulmonary embolus, but evaluation of the lobar and segmental branches is limited secondary to suboptimal contrast opacification. Normal heart size. No pericardial effusion. Normal caliber thoracic aorta. Mediastinum/Nodes: No enlarged mediastinal, hilar, or axillary lymph nodes. Thyroid gland, trachea, and esophagus demonstrate no significant findings. Lungs/Pleura: Large partial loculated right pleural effusion with compressive atelectasis with only a small area of aerated lung in the upper chest. Left lung is clear. No pneumothorax. Upper Abdomen: No acute upper abdominal abnormality. Low attenuation of the liver as can be seen with hepatic steatosis. Musculoskeletal: No acute osseous abnormality. No aggressive osseous lesion.Broad-based disc osteophyte complex at T11-12. Review of the MIP images confirms the above findings. IMPRESSION: 1. Suboptimal opacification of the pulmonary arteries. No large central pulmonary embolus, but evaluation of the lobar and segmental branches is limited secondary to suboptimal contrast opacification. 2. Large partial loculated right pleural effusion with compressive atelectasis with only a small area of aerated lung in the upper chest. Electronically Signed   By: HKathreen Devoid  On: 08/29/2017 16:40   Dg Chest Port 1 View  Result Date: 08/31/2017 CLINICAL DATA:  Endotracheal tube placement. Follow-up empyema. EXAM: PORTABLE CHEST 1 VIEW COMPARISON:  Chest radiograph performed 08/30/2017 FINDINGS: The patient's endotracheal tube is seen ending 3-4 cm above the carina. An enteric tube is noted extending  below the diaphragm. A  mildly loculated small right-sided pleural effusion is again noted, with 3 right-sided chest tubes. Underlying vascular congestion is noted, with increased interstitial markings, concerning for slightly worsened pulmonary edema. Underlying pneumonia is a concern. No significant pneumothorax is seen. The cardiomediastinal silhouette is enlarged. No acute osseous abnormalities are seen. IMPRESSION: 1. Endotracheal tube seen ending 3-4 cm above the carina. 2. Mildly loculated small right-sided pleural effusion again noted, with 3 right-sided chest tubes. 3. Underlying vascular congestion, with increased interstitial markings, concerning for slightly worsened pulmonary edema. Underlying pneumonia is a concern. Electronically Signed   By: Garald Balding M.D.   On: 08/31/2017 02:22   Dg Chest Port 1 View  Result Date: 08/30/2017 CLINICAL DATA:  Endotracheal tube and orogastric tube placement. EXAM: PORTABLE CHEST 1 VIEW COMPARISON:  Chest radiograph performed earlier today at 4:44 p.m. FINDINGS: The patient's endotracheal tube is seen ending 9 cm above the carina. An enteric tube is noted extending below the diaphragm. Three right-sided chest tubes are noted. A residual small right-sided pleural effusion is seen. Increased interstitial markings raise concern for mild pulmonary edema, perhaps mildly improved from the prior study. No definite pneumothorax is seen. The cardiomediastinal silhouette is borderline normal in size. No acute osseous abnormalities are identified. Skin staples are noted along the right side of the chest. IMPRESSION: 1. Endotracheal tube seen ending 9 cm above the carina. This could be advanced 5 cm. 2. Three right-sided chest tubes noted. Residual small right-sided pleural effusion seen. Increased interstitial markings raise concern for mild pulmonary edema, perhaps mildly improved from the prior study. 3. No definite pneumothorax seen. Electronically Signed   By: Garald Balding M.D.   On: 08/30/2017 23:10   Dg Chest Port 1 View  Result Date: 08/30/2017 CLINICAL DATA:  Empyema, post VATS procedure EXAM: PORTABLE CHEST 1 VIEW COMPARISON:  CT chest of 08/29/2017 and chest x-ray of 04/04/2017 FINDINGS: The right chest tube is now present scratch right chest tubes are now present. There is still poor aeration of the right lung with pleural opacity possibly representing residual pleural effusion-empyema. The left lung is clear. The tip of the endotracheal tube is approximately 3.4 cm above the carina. Mild cardiomegaly is stable. IMPRESSION: 1. Right chest tubes present with poor aeration of the right lung. 2. Right pleural opacity remains consistent with volume loss, effusion and possibly empyema. 3. Endotracheal tube tip 3.4 cm above the carina. Electronically Signed   By: Ivar Drape M.D.   On: 08/30/2017 16:56        Scheduled Meds: . bisacodyl  10 mg Oral Daily  . chlorhexidine gluconate (MEDLINE KIT)  15 mL Mouth Rinse BID  . enoxaparin (LOVENOX) injection  40 mg Subcutaneous Daily  . insulin aspart  0-24 Units Subcutaneous Q4H  . levalbuterol  0.63 mg Nebulization Q6H  . mouth rinse  15 mL Mouth Rinse 10 times per day  . metoCLOPramide (REGLAN) injection  10 mg Intravenous Q6H  . senna-docusate  1 tablet Oral QHS   Continuous Infusions: . ampicillin-sulbactam (UNASYN) IV Stopped (08/31/17 0527)  . azithromycin Stopped (08/30/17 2110)  . dexmedetomidine (PRECEDEX) IV infusion 1.2 mcg/kg/hr (08/31/17 0700)  . dextrose 5 % and 0.9% NaCl 125 mL/hr at 08/31/17 0600  . fentaNYL infusion INTRAVENOUS 100 mcg/hr (08/31/17 0600)  . midazolam (VERSED) infusion Stopped (08/31/17 0111)  . phenylephrine (NEO-SYNEPHRINE) Adult infusion Stopped (08/31/17 0200)  . potassium chloride    . vancomycin Stopped (08/31/17 0520)     LOS: 2 days  Time spent: 40 minutes    Zenia Guest, Geraldo Docker, MD [facility] Pager [phone]   If 7PM-7AM, please contact  night-coverage [email or web address] Password [facility] 08/31/2017, 7:49 AM

## 2017-08-31 NOTE — Progress Notes (Signed)
Advanced endotracheal tube 5 CM per MD after radiologist report read.  Pt tube secure now at 27 CM volumes remain the same  No issues to report.

## 2017-08-31 NOTE — Consult Note (Signed)
[facility]ious [facility]    Date of Admission:  08/29/2017            Reason for Consult: Empyema  Referring Provider: Cyndia Bent Primary Care Provider: Mikey Kirschner, MD   Assessment/Plan:  [name] is a 37 year old male with previous medical history of viral meningitis, obesity, and tobacco use admitted to the hospital with acute onset chest pain found to loculated pleural effusion. Now post-op day 1 from thoracotomy and decortication of the right lung with 1800 cc of purulent fluid removed during surgery. Gram stains with gram positive cocci in 1 of 2 surgical cultures with official culture remaining pending. On Day 3 of antimicrobial therapy with vancomycin and Unasyn. He remains intubated with goal of extubation today. HIV was negative.   1. Continue empiric coverage with vancomycin and Unasyn and narrow antibiotics pending culture results.  2. Wean ventilator per CVTS.   Principal Problem:   Loculated pleural effusion Active Problems:   PNA (pneumonia)   Chronic lower back pain   Sepsis ([place])   Empyema ([place])   . bisacodyl  10 mg Oral Daily  . chlorhexidine gluconate (MEDLINE KIT)  15 mL Mouth Rinse BID  . enoxaparin (LOVENOX) injection  40 mg Subcutaneous Daily  . insulin aspart  0-24 Units Subcutaneous Q4H  . insulin detemir  15 Units Subcutaneous BID  . levalbuterol  0.63 mg Nebulization Q6H  . mouth rinse  15 mL Mouth Rinse 10 times per day  . metoCLOPramide (REGLAN) injection  10 mg Intravenous Q6H  . senna-docusate  1 tablet Oral QHS     HPI: Clayton Jensen is a 37 y.o. male with previous medical history of viral meningitis and possible sleep apnea evaluated in the ED and admitted to the hospital with the chief complaint of worsening shortness of breath, coughing and chest pain starting 4 days prior to presentation after twisting to put his child down. He had low grade fevers at home.  Initially seen at Urgent Care with chest x-rays with no  significant findings and treated with prednisone and NSAIDS which were ineffective. Followed up with Urgent Care and noted to have right sided pleural effusion. CT scan with large partial loculated right pleural effusion.   CVTS evaluated and brought him to the OR for right thoracotomy for drainage of empyema and decortication of the lung. There was immediate release 1800 cc of white, purulent fluid which was sent for culture. Two chest tubes were placed and 1 Bard drain. He was kept on the ventilator overnight with the goal of weaning today.  He had a temperature of 101 last evening and has slight improvement in his leukocytosis. Currently receiving vancomycin and Unasyn for empiric coverage. Gram stain showing gram positive cocci in one of cultures with no organisms in the other. Cultures, fungal, and AFB remain pending. Blood cultures performed on 5/27 are with no growth in 2 days. Imaging today shows mildly loculated small right pleural effusion.   Review of Systems: Review of Systems  Unable to perform ROS: Intubated     Past Medical History:  Diagnosis Date  . Chronic lower back pain    "fell off roof in ~ 2003; disc fused itself"  . History of kidney stones   . OSA (obstructive sleep apnea)    clinical diagnosis, not officially diagnosed per family    Social History   Tobacco Use  . Smoking status: Current Every Day Smoker    Packs/day:  0.75    Years: 23.00    Pack years: 17.25    Types: Cigarettes  . Smokeless tobacco: Never Used  Substance Use Topics  . Alcohol use: Yes    Frequency: Never    Comment: 04/05/2017 "might have a beer q 2 months"  . Drug use: No    Family History  Problem Relation Age of Onset  . COPD Mother   . Atrial fibrillation Mother   . Diabetes Mother   . Diabetes Father     Allergies  Allergen Reactions  . Celery Oil Shortness Of Breath and Swelling    Swelling of throat    OBJECTIVE: Blood pressure (!) 99/45, pulse 65, temperature 99.7  F (37.6 C), temperature source Axillary, resp. rate 18, height 6' (1.829 m), weight (!) 370 lb 2.4 oz (167.9 kg), SpO2 98 %.  Physical Exam  Constitutional: He appears well-developed and well-nourished. He appears lethargic. He appears ill. No distress. He is intubated.  Cardiovascular: Normal rate, regular rhythm, normal heart sounds and intact distal pulses. Exam reveals no gallop and no friction rub.  No murmur heard. Pulmonary/Chest: Effort normal and breath sounds normal. No stridor. He is intubated. No respiratory distress. He has no wheezes. He has no rales. He exhibits no tenderness.  Chest tubes with serosanganeous drainage. Sites appear clean, dry and intact.  Neurological: He appears lethargic.  Opens eyes to voice  Skin: Skin is warm and dry.  Psychiatric: He has a normal mood and affect. His behavior is normal. Judgment and thought content normal.    Lab Results Lab Results  Component Value Date   WBC 17.5 (H) 08/31/2017   HGB 13.1 08/31/2017   HCT 39.1 08/31/2017   MCV 87.3 08/31/2017   PLT 227 08/31/2017    Lab Results  Component Value Date   CREATININE 0.86 08/31/2017   BUN 16 08/31/2017   NA 143 08/31/2017   K 5.3 (H) 08/31/2017   CL 112 (H) 08/31/2017   CO2 23 08/31/2017    Lab Results  Component Value Date   ALT 22 08/29/2017   AST 17 08/29/2017   ALKPHOS 82 08/29/2017   BILITOT 0.8 08/29/2017     Microbiology: Recent Results (from the past 240 hour(s))  Culture, blood (Routine X 2) w Reflex to ID Panel     Status: None (Preliminary result)   Collection Time: 08/29/17  3:05 PM  Result Value Ref Range Status   Specimen Description RIGHT ANTECUBITAL  Final   Special Requests   Final    BOTTLES DRAWN AEROBIC AND ANAEROBIC Blood Culture adequate volume   Culture   Final    NO GROWTH 2 DAYS Performed at [facility], [address], [place], [place] [address]    Report Status PENDING  Incomplete  Culture, blood (Routine X 2) w Reflex to ID Panel      Status: None (Preliminary result)   Collection Time: 08/29/17  3:43 PM  Result Value Ref Range Status   Specimen Description LEFT ANTECUBITAL  Final   Special Requests   Final    BOTTLES DRAWN AEROBIC AND ANAEROBIC Blood Culture adequate volume   Culture   Final    NO GROWTH 2 DAYS Performed at [facility], [address], [place], [place] [address]    Report Status PENDING  Incomplete  Surgical pcr screen     Status: None   Collection Time: 08/30/17 10:51 AM  Result Value Ref Range Status   MRSA, PCR NEGATIVE NEGATIVE Final  Staphylococcus aureus NEGATIVE NEGATIVE Final    Comment: (NOTE) The Xpert SA Assay (FDA approved for NASAL specimens in patients 62 years of age and older), is one component of a comprehensive surveillance program. It is not intended to diagnose infection nor to guide or monitor treatment. Performed at [facility], [place] [address] [place], [place] [address]   Aerobic/Anaerobic Culture (surgical/deep wound)     Status: None (Preliminary result)   Collection Time: 08/30/17  2:44 PM  Result Value Ref Range Status   Specimen Description EMPYEMA RIGHT  Final   Special Requests PATIENT ON FOLLOWING VANC  Final   Gram Stain   Final    MODERATE WBC PRESENT, PREDOMINANTLY PMN NO ORGANISMS SEEN Performed at [facility], [place] [address], [place], [place] [address]    Culture PENDING  Incomplete   Report Status PENDING  Incomplete  Aerobic/Anaerobic Culture (surgical/deep wound)     Status: None (Preliminary result)   Collection Time: 08/30/17  2:51 PM  Result Value Ref Range Status   Specimen Description TISSUE RIGHT LUNG  Final   Special Requests PATIENT ON FOLLOWING VANC  Final   Gram Stain   Final    MODERATE WBC PRESENT, PREDOMINANTLY PMN FEW GRAM POSITIVE COCCI Performed at [facility], [place] [address], [place], [place] [address]    Culture PENDING  Incomplete   Report Status PENDING  Incomplete     Terri Piedra, NP [facility] [phone] Pager  08/31/2017  8:25 AM

## 2017-08-31 NOTE — Progress Notes (Addendum)
Pharmacy Antibiotic Note  Clayton Jensen is a 37 y.o. male admitted on 08/29/2017 with pneumonia.  Pharmacy has been consulted for Vancomycin dosing.  S/p VATS with evacuation of 2 L purulent fluid c/w empyema - ID following and continuing unasyn + vancomycin. Vancomycin trough tonight approximately 8 hours from last dose came back at 14 on every 8 hour regimen - likely will accumulate. Renal function is stable (Scr 0.86). WBC decreasing from 21.9 to 17.5.   Plan: Continue Vancomycin 1.5gm IV every 8 hours.  Goal trough 15-20 mcg/mL.  Continue ampicillin-sulbactam 1.5 g IV every 6 hours. Monitor renal function, cx results, clinical pic, and VT as prn  Height: 6' (182.9 cm) Weight: (!) 370 lb 2.4 oz (167.9 kg) IBW/kg (Calculated) : 77.6  Temp (24hrs), Avg:99.3 F (37.4 C), Min:98.6 F (37 C), Max:100.4 F (38 C)  Recent Labs  Lab 08/29/17 1505 08/29/17 1513 08/31/17 0321 08/31/17 2014  WBC 21.9*  --  17.5*  --   CREATININE 0.83  --  0.86  --   LATICACIDVEN  --  1.12  --   --   VANCOTROUGH  --   --   --  14*    Estimated Creatinine Clearance: 187.3 mL/min (by C-G formula based on SCr of 0.86 mg/dL).    Allergies  Allergen Reactions  . Celery Oil Shortness Of Breath and Swelling    Swelling of throat   Antimicrobials this admission: Vancomycin 5/27 >>  Rocephin 5/27 >>  Azith 5/27 >>5/28  Dose adjustments this admission: VT 5/29 (8 hr from LD)= 14 - no change   Microbiology results: 5/28 pleural fluid: NGTD 5/27 BCx: ngtd MRSA PCR: neg  Thank you for allowing pharmacy to be a part of this patient's care.  Girard Cooter, PharmD Clinical Pharmacist  Pager: [phone] Phone: [phone] 08/31/2017 10:08 PM

## 2017-08-31 NOTE — Procedures (Signed)
Extubation Procedure Note  Patient Details:   Name: Clayton Jensen DOB: Apr 17, 1980 MRN: [id number]   Airway Documentation: NIF -35 cmh20, VC 1.6 L/min, able to follow all commands, audible cuff leak prior to extubation.  Placed on 4 lpm [place] sat 97%. Pt able to state full name, talk fine no stridor noted. IS instruction given 1750 cc x 10.  Good cough   Vent end date: 08/31/17 Vent end time: 0933   Evaluation  O2 sats: stable throughout Complications: No apparent complications Patient did tolerate procedure well. Bilateral Breath Sounds: Diminished, Rhonchi   Yes  Clayton Jensen 08/31/2017, 9:36 AM

## 2017-08-31 NOTE — Progress Notes (Signed)
30 ml versed wasted with Crystal RIce, RN in sink  50 ml fentanyl wasted with Heloise Beecham, RN in sink

## 2017-08-31 NOTE — Plan of Care (Signed)
  Problem: Clinical Measurements: Goal: Ability to maintain clinical measurements within normal limits will improve Outcome: Progressing Goal: Will remain free from infection Outcome: Progressing Goal: Diagnostic test results will improve Outcome: Progressing Goal: Respiratory complications will improve Outcome: Progressing Goal: Cardiovascular complication will be avoided Outcome: Progressing   Problem: Coping: Goal: Level of anxiety will decrease Outcome: Progressing   Problem: Elimination: Goal: Will not experience complications related to bowel motility Outcome: Progressing Goal: Will not experience complications related to urinary retention Outcome: Progressing   Problem: Pain Managment: Goal: General experience of comfort will improve Outcome: Progressing   Problem: Cardiac: Goal: Hemodynamic stability will improve Outcome: Progressing   Problem: Clinical Measurements: Goal: Postoperative complications will be avoided or minimized Outcome: Progressing   Problem: Respiratory: Goal: Respiratory status will improve Outcome: Progressing   Problem: Pain Management: Goal: Pain level will decrease Outcome: Progressing   Problem: Skin Integrity: Goal: Wound healing without signs and symptoms infection will improve Outcome: Progressing

## 2017-08-31 NOTE — Progress Notes (Signed)
TCTS BRIEF SICU PROGRESS NOTE  1 Day Post-Op  S/P Procedure(s) (LRB): VIDEO ASSISTED THORACOSCOPY (VATS)/EMPYEMA (Right)   Extubated uneventfully Breathing comfortably w/ O2 sats 96% on 2 L/min  Plan: Continue current plan  Purcell Nails, MD 08/31/2017 5:23 PM

## 2017-08-31 NOTE — Progress Notes (Signed)
   08/31/17 0630  Vitals  Temp 98.8 F (37.1 C)  Temp Source Oral

## 2017-08-31 NOTE — Care Management Important Message (Signed)
Important Message  Patient Details  Name: Clayton Jensen MRN: [id number] Date of Birth: 11/27/1980   Medicare Important Message Given:  Yes    Aloni Chuang 08/31/2017, 1:39 PM

## 2017-08-31 NOTE — Progress Notes (Signed)
CT surgery PM rounds  Patient  remains intubated following right VATS for drainage of large right empyema. Although patient on sedation protocol follow-up chest x-ray shows ET tube has been pulled back which was corrected by RT repositioning the tube.  Repeat chest x-ray pending. Blood pressure remained stable on low-dose phenylephrine.  Postop ABG satisfactory.  Urine output adequate.

## 2017-09-01 ENCOUNTER — Inpatient Hospital Stay (HOSPITAL_COMMUNITY): Payer: 59

## 2017-09-01 LAB — GLUCOSE, CAPILLARY
GLUCOSE-CAPILLARY: 144 mg/dL — AB (ref 65–99)
GLUCOSE-CAPILLARY: 62 mg/dL — AB (ref 65–99)
GLUCOSE-CAPILLARY: 73 mg/dL (ref 65–99)
Glucose-Capillary: 151 mg/dL — ABNORMAL HIGH (ref 65–99)
Glucose-Capillary: 125 mg/dL — ABNORMAL HIGH (ref 65–99)
Glucose-Capillary: 106 mg/dL — ABNORMAL HIGH (ref 65–99)
Glucose-Capillary: 68 mg/dL (ref 65–99)

## 2017-09-01 LAB — COMPREHENSIVE METABOLIC PANEL
ALT: 60 U/L (ref 17–63)
ANION GAP: 7 (ref 5–15)
AST: 75 U/L — ABNORMAL HIGH (ref 15–41)
Albumin: 1.9 g/dL — ABNORMAL LOW (ref 3.5–5.0)
Alkaline Phosphatase: 78 U/L (ref 38–126)
BUN: 16 mg/dL (ref 6–20)
CHLORIDE: 109 mmol/L (ref 101–111)
CO2: 25 mmol/L (ref 22–32)
Calcium: 8 mg/dL — ABNORMAL LOW (ref 8.9–10.3)
Creatinine, Ser: 0.81 mg/dL (ref 0.61–1.24)
GFR calc non Af Amer: >60 mL/min (ref >60)
Glucose, Bld: 115 mg/dL — ABNORMAL HIGH (ref 65–99)
Potassium: 4.4 mmol/L (ref 3.5–5.1)
SODIUM: 141 mmol/L (ref 135–145)
Total Bilirubin: 0.7 mg/dL (ref 0.3–1.2)
Total Protein: 5.6 g/dL — ABNORMAL LOW (ref 6.5–8.1)

## 2017-09-01 LAB — CBC
HCT: 46.1 % (ref 39.0–52.0)
Hemoglobin: 15.1 g/dL (ref 13.0–17.0)
MCH: 29.2 pg (ref 26.0–34.0)
MCHC: 32.8 g/dL (ref 30.0–36.0)
MCV: 89.2 fL (ref 78.0–100.0)
PLATELETS: 266 10*3/uL (ref 150–400)
RBC: 5.17 MIL/uL (ref 4.22–5.81)
RDW: 13.2 % (ref 11.5–15.5)
WBC: 23 10*3/uL — ABNORMAL HIGH (ref 4.0–10.5)

## 2017-09-01 MED ORDER — FUROSEMIDE 10 MG/ML IJ SOLN
40.0000 mg | Freq: Once | INTRAMUSCULAR | Status: AC
  Administered 2017-09-01: 40 mg via INTRAVENOUS
  Filled 2017-09-01: qty 4

## 2017-09-01 MED ORDER — SODIUM CHLORIDE 0.9 % IV SOLN
1.5000 g | Freq: Four times a day (QID) | INTRAVENOUS | Status: DC
  Administered 2017-09-02 (×2): 1.5 g via INTRAVENOUS
  Filled 2017-09-01 (×4): qty 1.5

## 2017-09-01 MED ORDER — PANTOPRAZOLE SODIUM 40 MG PO TBEC
40.0000 mg | DELAYED_RELEASE_TABLET | Freq: Every day | ORAL | Status: DC
  Administered 2017-09-01 – 2017-09-05 (×5): 40 mg via ORAL
  Filled 2017-09-01 (×5): qty 1

## 2017-09-01 MED ORDER — INSULIN DETEMIR 100 UNIT/ML ~~LOC~~ SOLN
15.0000 [IU] | Freq: Every day | SUBCUTANEOUS | Status: DC
  Administered 2017-09-01: 15 [IU] via SUBCUTANEOUS
  Filled 2017-09-01 (×2): qty 0.15

## 2017-09-01 MED ORDER — SODIUM CHLORIDE 0.9 % IV SOLN
1.5000 g | Freq: Four times a day (QID) | INTRAVENOUS | Status: DC
  Administered 2017-09-01: 1.5 g via INTRAVENOUS
  Filled 2017-09-01 (×3): qty 1.5

## 2017-09-01 NOTE — Progress Notes (Signed)
Patient ID: Clayton Jensen, male   DOB: 1981-02-08, 37 y.o.   MRN: [id number] TCTS Evening Rounds:  Hemodynamically stable in sinus rhythm  Chest tube output fairly low  Good diuresis.  Ambulated

## 2017-09-01 NOTE — Progress Notes (Signed)
Hypoglycemic Event  CBG: 62   Treatment: 8 oz Apple juice + 4 graham crackers and Patient's wife arrived with dinner.   Symptoms: No associated symptoms  Follow-up CBG: Time:2026 CBG Result:73  Possible Reasons for Event: Poor food Intake-late dinner       [place] Calix

## 2017-09-01 NOTE — Discharge Summary (Signed)
Physician Discharge Summary  Patient ID: Clayton Jensen MRN: [id number] DOB/AGE: 39-Apr-1982 39 y.o.  Admit date: 08/29/2017 Discharge date: 09/05/2017  Admission Diagnoses:  Patient Active Problem List   Diagnosis Date Noted  . Poor dentition   . Streptococcal pneumonia ([place])   . Empyema ([place]) 08/30/2017  . 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  . PNA (pneumonia) 08/29/2017  . Sepsis ([place]) 08/29/2017  . Chronic lower back pain   . Tremor, essential 04/08/2017  . Encephalopathy   . 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 Abnormal liver function   . Ataxia   . Altered mental state 04/04/2017   Discharge Diagnoses:   Patient Active Problem List   Diagnosis Date Noted  . Poor dentition   . Streptococcal pneumonia ([place])   . Empyema ([place]) 08/30/2017  . 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  . PNA (pneumonia) 08/29/2017  . Sepsis ([place]) 08/29/2017  . Chronic lower back pain   . Tremor, essential 04/08/2017  . Encephalopathy   . 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 Abnormal liver function   . Ataxia   . 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  He was putting his child to bed and developed sudden onset of right sided chest pain.  He thought he pulled a muscle or broke a rib because he heard a "pop."  He had a lot of pain and developed difficulty breathing due to pain.  He presented to Urgent care the next day.  They obtained a CXR which he was told was normal.  He was given costochondritis and put on NSAIDs and Prednisone.  He continued to have a lot of pain and difficulty breathing.  He later developed a fever on Sunday and presented back to the urgent care.  CXR was repeated and showed a large right sided pleural effusion.  He was referred to the ED at  [facility] where CT scan of the chest showed a loculated large right pleural effusion consistent with empyema.  Due to this he was sent to [facility] for further treatment.  Hospital Course:   He was placed on broad spectrum ABX.  Thoracic surgery consult was requested for drainage of empyema.  He was evaluated by Dr. Cyndia Bent, who was in agreement the patient would benefit from drainage of empyema.  The risks and benefits of the procedure were explained to the patient and he was agreeable to proceed.  He was taken to the operating room on 08/30/2017.  He underwent right thoracotomy with drainage of empyema and decortication of the right lung.  He tolerated the procedure without difficulty and was taken to the SICU in stable condition.  During his stay in the SICU the patient was weaned and extubated on POD #1.  Infectious disease consult was obtained and they recommended Vancomycin and Unasyn until OR culture sensitivities could be reported.  His CXR remained stable with improvement of pleural effusion.  His bard chest drain was discontinued on 09/01/2017.  His anterior chest tube was removed on 09/02/2017.  His OR cultures grew Strep Viridans.  ID recommended he be discharged on IV Ampicillin to complete 2 weeks total IV treatment.  He will follow with 10 days of oral Amoxicillin.  He remains clinically stable.  His leukocytosis has normalized.  PICC  line has been placed.  His incisions are healing without evidence of infection.  He is medically stable for discharge home today.       Significant Diagnostic Studies: radiology:   CT scan: 1. Suboptimal opacification of the pulmonary arteries. No large central pulmonary embolus, but evaluation of the lobar and segmental branches is limited secondary to suboptimal contrast opacification. 2. Large partial loculated right pleural effusion with compressive atelectasis with only a small area of aerated lung in the upper chest.  Treatments:  surgery:   1. Right thoracotomy  2. Drainage of empyema  3. Decortication of the Right lung    Disposition: Home  Discharge medications:  Discharge Instructions    Home infusion instructions [facility] May follow [place] Dosing Protocol; May administer Cathflo as needed to maintain patency of vascular access device.; Flushing of vascular access device: per [facility] Protocol: 0.9% NaCl pre/post medica...   Complete by:  As directed    Instructions:  May follow [place] Dosing Protocol   Instructions:  May administer Cathflo as needed to maintain patency of vascular access device.   Instructions:  Flushing of vascular access device: per [facility] Protocol: 0.9% NaCl pre/post medication administration and prn patency; Heparin 100 u/ml, 82m for implanted ports and Heparin 10u/ml, 558mfor all other central venous catheters.   Instructions:  May follow [facility] Anaphylaxis Protocol for First Dose Administration in the home: 0.9% NaCl at 25-50 ml/hr to maintain IV access for protocol meds. Epinephrine 0.3 ml IV/IM PRN and Benadryl 25-50 IV/IM PRN s/s of anaphylaxis.   Instructions:  [facility] Coordinator (RN) to assist per patient IV care needs in the home PRN.     Allergies as of 09/05/2017      Reactions   Celery Oil Shortness Of Breath, Swelling   Swelling of throat      Medication List    STOP taking these medications   ketorolac 10 MG tablet Commonly known as:  TORADOL   predniSONE 10 MG tablet Commonly known as:  DELTASONE     TAKE these medications   acetaminophen 500 MG tablet Commonly known as:  TYLENOL Take 500 mg by mouth every 6 (six) hours as needed for mild pain.   amoxicillin 500 MG tablet Commonly known as:  AMOXIL Take 1 tablet (500 mg total) by mouth 3 (three) times daily. X 10 days, start after completion of IV Antibiotics   ampicillin IVPB Inject 12 g into the vein daily. Continuous ampicillin 12g IV infusion over 24 hrs  Indication:  Strep. Viridans Empyema / PNA Last Day of Therapy: 09/11/17 Labs - Once weekly:  CBC/D and BMP, Labs - Every other week:  ESR and CRP   cetirizine 10 MG tablet Commonly known as:  ZYRTEC Take 10 mg by mouth once as needed for allergies.   furosemide 40 MG tablet Commonly known as:  LASIX Take 1 tablet (40 mg total) by mouth daily. For 3 days Start taking on:  09/06/2017   methocarbamol 500 MG tablet Commonly known as:  ROBAXIN Take 500 mg by mouth every 6 (six) hours as needed for muscle spasms.   potassium chloride SA 20 MEQ tablet Commonly known as:  K-DUR,KLOR-CON Take 1 tablet (20 mEq total) by mouth daily. For 3 days Start taking on:  09/06/2017   traMADol 50 MG tablet Commonly known as:  ULTRAM Take 1 tablet (50 mg total) by mouth every 4 (four) hours as needed.  Home Infusion Instuctions  (From admission, onward)        Start     Ordered   09/04/17 0000  Home infusion instructions [facility] May follow [place] Dosing Protocol; May administer Cathflo as needed to maintain patency of vascular access device.; Flushing of vascular access device: per [facility] Protocol: 0.9% NaCl pre/post medica...    Question Answer Comment  Instructions May follow [place] Dosing Protocol   Instructions May administer Cathflo as needed to maintain patency of vascular access device.   Instructions Flushing of vascular access device: per [facility] Protocol: 0.9% NaCl pre/post medication administration and prn patency; Heparin 100 u/ml, 64m for implanted ports and Heparin 10u/ml, 566mfor all other central venous catheters.   Instructions May follow [facility] Anaphylaxis Protocol for First Dose Administration in the home: 0.9% NaCl at 25-50 ml/hr to maintain IV access for protocol meds. Epinephrine 0.3 ml IV/IM PRN and Benadryl 25-50 IV/IM PRN s/s of anaphylaxis.   Instructions [facility] Infusion Coordinator (RN) to assist per patient IV care needs in the home PRN.       09/04/17 1142     Follow-up Information    [facility] [place] Follow up on 09/26/2017.   Specialty:  Cardiothoracic Surgery Why:  Appointment is at 3:30, please get CXR at 3:00 at [facility] on first floor of our office building Contact information: [phone]     [facility] on 09/13/2017.   Specialty:  Cardiothoracic Surgery Why:  Appointment is at 10:30 for suture and staple removal Contact information: [phone]        Signed: ErEllwood Handler/06/2017, 7:57 AM

## 2017-09-01 NOTE — Progress Notes (Signed)
Spoke with patient about wearing CPAP.  Patient as well as is wife who is at the bedside states patient has never wore CPAP and don't feel he needs it.  I advised patient if he feels like he is having any difficulty breathing throughout the night to let the RN know and she will call me and I will assess for CPAP.

## 2017-09-01 NOTE — Progress Notes (Signed)
Pt declines the use of CPAP this evening and decline a 0200 treatment.

## 2017-09-01 NOTE — Plan of Care (Signed)
  Problem: Education: Goal: Knowledge of General Education information will improve Outcome: Progressing   Problem: Health Behavior/Discharge Planning: Goal: Ability to manage health-related needs will improve Outcome: Progressing   Problem: Clinical Measurements: Goal: Ability to maintain clinical measurements within normal limits will improve Outcome: Progressing Goal: Will remain free from infection Outcome: Progressing Goal: Diagnostic test results will improve Outcome: Progressing Goal: Respiratory complications will improve Outcome: Progressing Goal: Cardiovascular complication will be avoided Outcome: Progressing   Problem: Activity: Goal: Risk for activity intolerance will decrease Outcome: Progressing   Problem: Coping: Goal: Level of anxiety will decrease Outcome: Progressing   Problem: Elimination: Goal: Will not experience complications related to bowel motility Outcome: Progressing Goal: Will not experience complications related to urinary retention Outcome: Progressing   Problem: Pain Managment: Goal: General experience of comfort will improve Outcome: Progressing   Problem: Safety: Goal: Ability to remain free from injury will improve Outcome: Progressing   Problem: Skin Integrity: Goal: Risk for impaired skin integrity will decrease Outcome: Progressing   Problem: Education: Goal: Knowledge of disease or condition will improve Outcome: Progressing Goal: Knowledge of the prescribed therapeutic regimen will improve Outcome: Progressing   Problem: Activity: Goal: Risk for activity intolerance will decrease Outcome: Progressing   Problem: Cardiac: Goal: Hemodynamic stability will improve Outcome: Progressing   Problem: Clinical Measurements: Goal: Postoperative complications will be avoided or minimized Outcome: Progressing   Problem: Respiratory: Goal: Respiratory status will improve Outcome: Progressing   Problem: Pain  Management: Goal: Pain level will decrease Outcome: Progressing   Problem: Skin Integrity: Goal: Wound healing without signs and symptoms infection will improve Outcome: Progressing

## 2017-09-01 NOTE — Progress Notes (Signed)
ID PROGRESS NOTE  I have seen and examined  Clayton Jensen with Marcos Eke NP. I agree with the plan in his notes. Today- Clayton Lussier reports good pain control, has been ambulating twice since we last saw him. He denies fevers. Occasional pain with deep inspiration. Slight dislodged foley catheter last night. He reports one of his chest tubes and foley catheter will be removed today  On VS review, he remains afebrile. OR Cx still negative. WBC higher today up to 23K  A/P: continue on broad spectrum, await culture results to narrow abtx if possible.

## 2017-09-01 NOTE — Progress Notes (Addendum)
PROGRESS NOTE    Clayton Jensen  SWN:[id number] DOB: Jan 01, 1981 DOA: 08/29/2017 PCP: Mikey Kirschner, MD   Brief Narrative:  37 y.o. 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  Patient reports that it started about 4 days ago when he was rocking his baby when he turned and pulled a muscle in his right side.  He immediately heard a pop and it was extremely painful and had difficulty breathing due to the pain.  The following day he went to urgent care at which point he had a chest x-ray done which was normal and was told that he had costochondritis and was started on NSAIDs and prednisone.  He went home continue to have a lot of pain and continued to have difficulty in breathing due to the pain.  Yesterday he started spiking fevers.  They went back to the urgent care today because of the fevers he had a repeat chest x-ray which showed a large right-sided effusion and he was told to go immediately to the emergency department.  He has a CT scan done today which shows a partially loculated large right-sided pleural effusion.  CT surgery at [facility] is been called who are recommending patient go to the OR to have this drained tonight or in the morning.  He denies any nausea vomiting diarrhea.  Patient given vancomycin and Zosyn in the ED and referred for admission for surgical intervention at [facility].     Subjective: 5/30 A/O x4, CP (right chest wall) consistent with chest tube placement.  Negative S OB, negative abdominal pain.   Assessment & Plan:   Principal Problem:   Loculated pleural effusion Active Problems:   PNA (pneumonia)   Chronic lower back pain   Sepsis (HCC)   Empyema (HCC)  RIGHT large loculated pleural effusion/Empyema/tissue positive strep viridans (few) -5/28 S/P VATS 1871m purulent fluid  aspirated -Multiple chest tubes in place, draining serosanguineous fluid  -Extubated titrate patient off Precedex - Continue current antibiotic.  Would cover strep viridans.  Still awaiting pleural fluid culture findings. -Continue fentanyl drip for chest tube pain per cardiothoracic surgery  Sepsis pneumonia? -Unclear source of infection. -See empyema -Chest tube care per cardiothoracic surgery - Incentive spirometry  OSA/OHS -CPAP per respiratory   Morbid Obesity( Body mass index is 50.2 kg/m) -Diet currently clear liquids.  Once started on solid food would limit to 2000 cal/day diet.      DVT prophylaxis: Lovenox Code Status: Full Family Communication: Family at bedside for discussion plan of care Disposition Plan: TBD   Consultants:  TCTS     Procedures/Significant Events:  5[facility]27 [facility] RIGHT Thoracotomy/drainage empyema/decortication RIGHT lung    I have personally reviewed and interpreted all radiology studies and my findings are as above.  VENTILATOR SETTINGS:    Cultures 5/27 blood NGTD 5/28 MRSA PCR negative 5/28 RIGHT empyema reintubated for better growth 5/28 RIGHT lung tissue positive strep viridans few    Antimicrobials: Anti-infectives (From admission, onward)   Start     Stop   08/30/17 1800  ampicillin-sulbactam (UNASYN) 1.5 g in sodium chloride 0.9 % 100 mL IVPB         08/30/17 0300  vancomycin (VANCOCIN) 1,500 mg in sodium chloride 0.9 % 500 mL IVPB  08/29/17 1900  vancomycin (VANCOCIN) IVPB 1000 mg/200 mL premix     08/30/17 1555   08/29/17 1815  cefTRIAXone (ROCEPHIN) 1 g in sodium chloride 0.9 % 100 mL IVPB  Status:  Discontinued     08/30/17 1734   08/29/17 1815  azithromycin (ZITHROMAX) 500 mg in sodium chloride 0.9 % 250 mL IVPB  Status:  Discontinued     08/31/17 0924   08/29/17 1630  vancomycin (VANCOCIN) IVPB 1000 mg/200 mL premix     08/29/17 1843   08/29/17 1630   piperacillin-tazobactam (ZOSYN) IVPB 3.375 g     08/29/17 1720       Devices      LINES / TUBES:  RIGHT 36 French chest tubes 5/28>> RIGHT 32 French chest tube 5/28>>      Continuous Infusions: . ampicillin-sulbactam (UNASYN) IV Stopped (09/01/17 0569)  . dextrose 5 % and 0.9% NaCl Stopped (08/31/17 1900)  . famotidine (PEPCID) IV Stopped (08/31/17 2159)  . potassium chloride    . vancomycin 1,500 mg (09/01/17 0600)     Objective: Vitals:   09/01/17 0500 09/01/17 0600 09/01/17 0700 09/01/17 0729  BP: 113/85 110/66 128/81   Pulse: 67 67 74   Resp: _0 Temp:    97.8 F (36.6 C)  TempSrc:    Axillary  SpO2: 96% 97% 94%   Weight:      Height:        Intake/Output Summary (Last 24 hours) at 09/01/2017 0743 Last data filed at 09/01/2017 0600 Gross per 24 hour  Intake 4263.3 ml  Output 2160 ml  Net 2103.3 ml   Filed Weights   08/29/17 1414 08/29/17 2200  Weight: (!) 350 lb (158.8 kg) (!) 370 lb 2.4 oz (167.9 kg)     Physical Exam:  General: A/O x4, No acute respiratory distress Neck:  Negative scars, masses, torticollis, lymphadenopathy, JVD Lungs: difficult to auscultate secondary to patient's body habitus, appears clear auscultation bilaterally without wheezes or crackles.  Right chest wall tenderness secondary to multiple chest tubes Cardiovascular: Regular rate and rhythm without murmur gallop or rub normal S1 and S2 Abdomen: MORBIDLY OBESE, negative abdominal pain, nondistended, positive soft, bowel sounds, no rebound, no ascites, no appreciable mass Extremities: No significant cyanosis, clubbing, or edema bilateral lower extremities Skin: Negative rashes, lesions, ulcers Psychiatric:  Negative depression, negative anxiety, negative fatigue, negative mania  Central nervous system:  Cranial nerves II through XII intact, tongue/uvula midline, all extremities muscle strength 5/5, sensation intact throughout,  negative dysarthria, negative expressive  aphasia, negative receptive aphasia. .     Data Reviewed: Care during the described time interval was provided by me .  I have reviewed this patient's available data, including medical history, events of note, physical examination, and all test results as part of my evaluation.   CBC: Recent Labs  Lab 08/29/17 1505 08/30/17 1447 08/31/17 0321 09/01/17 0301  WBC 21.9*  --  17.5* 23.0*  NEUTROABS 17.9*  --   --   --   HGB 14.0 14.3 13.1 15.1  HCT 39.8 42.0 39.1 46.1  MCV 85.4  --  87.3 89.2  PLT 224  --  227 794   Basic Metabolic Panel: Recent Labs  Lab 08/29/17 1505 08/30/17 1447 08/31/17 0321 08/31/17 1525  NA 138 140 143  --   K 4.3 4.6 5.3*  --   CL 106  --  112*  --   CO2 24  --  23  --  GLUCOSE 125*  --  188*  --   BUN 17  --  16  --   CREATININE 0.83  --  0.86  --   CALCIUM 8.6*  --  7.9*  --   MG  --   --   --  2.3   GFR: Estimated Creatinine Clearance: 187.3 mL/min (by C-G formula based on SCr of 0.86 mg/dL). Liver Function Tests: Recent Labs  Lab 08/29/17 1505  AST 17  ALT 22  ALKPHOS 82  BILITOT 0.8  PROT 6.7  ALBUMIN 2.7*   No results for input(s): LIPASE, AMYLASE in the last 168 hours. No results for input(s): AMMONIA in the last 168 hours. Coagulation Profile: No results for input(s): INR, PROTIME in the last 168 hours. Cardiac Enzymes: Recent Labs  Lab 08/29/17 1505  TROPONINI 0.05*   BNP (last 3 results) No results for input(s): PROBNP in the last 8760 hours. HbA1C: No results for input(s): HGBA1C in the last 72 hours. CBG: Recent Labs  Lab 08/31/17 1247 08/31/17 1643 08/31/17 2114 08/31/17 2327 09/01/17 0352  GLUCAP 171* 132* 125* 144* 151*   Lipid Profile: No results for input(s): CHOL, HDL, LDLCALC, TRIG, CHOLHDL, LDLDIRECT in the last 72 hours. Thyroid Function Tests: No results for input(s): TSH, T4TOTAL, FREET4, T3FREE, THYROIDAB in the last 72 hours. Anemia Panel: No results for input(s): VITAMINB12, FOLATE,  FERRITIN, TIBC, IRON, RETICCTPCT in the last 72 hours. Urine analysis:    Component Value Date/Time   COLORURINE YELLOW 04/04/2017 0832   APPEARANCEUR CLEAR 04/04/2017 0832   LABSPEC 1.017 04/04/2017 0832   PHURINE 6.0 04/04/2017 0832   GLUCOSEU NEGATIVE 04/04/2017 0832   HGBUR SMALL (A) 04/04/2017 0832   BILIRUBINUR NEGATIVE 04/04/2017 0832   [place] 04/04/2017 0832   PROTEINUR 100 (A) 04/04/2017 0832   UROBILINOGEN 0.2 10/17/2014 0350   NITRITE NEGATIVE 04/04/2017 0832   LEUKOCYTESUR NEGATIVE 04/04/2017 0832   Sepsis Labs: _0 (procalcitonin:4,lacticidven:4)  ) Recent Results (from the past 240 hour(s))  Culture, blood (Routine X 2) w Reflex to ID Panel     Status: None (Preliminary result)   Collection Time: 08/29/17  3:05 PM  Result Value Ref Range Status   Specimen Description RIGHT ANTECUBITAL  Final   Special Requests   Final    BOTTLES DRAWN AEROBIC AND ANAEROBIC Blood Culture adequate volume   Culture   Final    NO GROWTH 3 DAYS Performed at [facility], [address] [place], [place] [address]    Report Status PENDING  Incomplete  Culture, blood (Routine X 2) w Reflex to ID Panel     Status: None (Preliminary result)   Collection Time: 08/29/17  3:43 PM  Result Value Ref Range Status   Specimen Description LEFT ANTECUBITAL  Final   Special Requests   Final    BOTTLES DRAWN AEROBIC AND ANAEROBIC Blood Culture adequate volume   Culture   Final    NO GROWTH 3 DAYS Performed at [facility], [address] [place], [place] [address]    Report Status PENDING  Incomplete  Surgical pcr screen     Status: None   Collection Time: 08/30/17 10:51 AM  Result Value Ref Range Status   MRSA, PCR NEGATIVE NEGATIVE Final   Staphylococcus aureus NEGATIVE NEGATIVE Final    Comment: (NOTE) The Xpert SA Assay (FDA approved for NASAL specimens in patients 44 years of age and older), is one component of a comprehensive surveillance program. It is not intended  to diagnose infection  nor to guide or monitor treatment. Performed at [facility], [place] [address], [place], [place] [address]   Acid Fast Smear (AFB)     Status: None   Collection Time: 08/30/17  2:44 PM  Result Value Ref Range Status   AFB Specimen Processing Concentration  Final   Acid Fast Smear Negative  Final    Comment: (NOTE) Performed At: [facility] [place], [place] [id number] Rush Farmer MD IO:[id number]    Source (AFB) EMPYEMA  Final    Comment: RIGHT Performed at [facility], [place] [address] [place], [place] [address]   Aerobic/Anaerobic Culture (surgical/deep wound)     Status: None (Preliminary result)   Collection Time: 08/30/17  2:44 PM  Result Value Ref Range Status   Specimen Description EMPYEMA RIGHT  Final   Special Requests PATIENT ON FOLLOWING VANC  Final   Gram Stain   Final    MODERATE WBC PRESENT, PREDOMINANTLY PMN NO ORGANISMS SEEN    Culture   Final    NO GROWTH < 24 HOURS Performed at [facility], [place] [address] [place], [place] [address]    Report Status PENDING  Incomplete  Acid Fast Smear (AFB)     Status: None   Collection Time: 08/30/17  2:51 PM  Result Value Ref Range Status   AFB Specimen Processing Comment  Final    Comment: Tissue Grinding and Digestion/Decontamination   Acid Fast Smear Negative  Final    Comment: (NOTE) Performed At: [facility] [place], [place] [id number] Rush Farmer MD BP:[id number]    Source (AFB) TISSUE  Final    Comment: RIGHT LUNG Performed at [facility], [place] [address], [place], [place] [address]   Aerobic/Anaerobic Culture (surgical/deep wound)     Status: None (Preliminary result)   Collection Time: 08/30/17  2:51 PM  Result Value Ref Range Status   Specimen Description TISSUE RIGHT LUNG  Final   Special Requests PATIENT ON FOLLOWING VANC  Final   Gram Stain   Final    MODERATE WBC PRESENT, PREDOMINANTLY PMN FEW  GRAM POSITIVE COCCI    Culture   Final    NO GROWTH < 24 HOURS Performed at [facility], [place] [address] [place], [place] [address]    Report Status PENDING  Incomplete         Radiology Studies: Dg Abd 1 View  Result Date: 08/30/2017 CLINICAL DATA:  Assess orogastric tube placement. EXAM: ABDOMEN - 1 VIEW COMPARISON:  CT of the abdomen and pelvis from 10/17/2014 FINDINGS: The patient's enteric tube is noted ending overlying the body of the stomach. The visualized bowel gas pattern is nonspecific, with distention of the colon with air. No free intra-abdominal air is seen, though evaluation for free air is limited on a single supine view. No acute osseous abnormalities are identified. IMPRESSION: Enteric tube noted ending overlying the body of the stomach. Electronically Signed   By: Garald Balding M.D.   On: 08/30/2017 23:10   Dg Chest Port 1 View  Result Date: 08/31/2017 CLINICAL DATA:  Endotracheal tube placement. Follow-up empyema. EXAM: PORTABLE CHEST 1 VIEW COMPARISON:  Chest radiograph performed 08/30/2017 FINDINGS: The patient's endotracheal tube is seen ending 3-4 cm above the carina. An enteric tube is noted extending below the diaphragm. A mildly loculated small right-sided pleural effusion is again noted, with 3 right-sided chest tubes. Underlying vascular congestion is noted, with increased interstitial markings, concerning  for slightly worsened pulmonary edema. Underlying pneumonia is a concern. No significant pneumothorax is seen. The cardiomediastinal silhouette is enlarged. No acute osseous abnormalities are seen. IMPRESSION: 1. Endotracheal tube seen ending 3-4 cm above the carina. 2. Mildly loculated small right-sided pleural effusion again noted, with 3 right-sided chest tubes. 3. Underlying vascular congestion, with increased interstitial markings, concerning for slightly worsened pulmonary edema. Underlying pneumonia is a concern. Electronically Signed   By: Garald Balding M.D.   On: 08/31/2017 02:22   Dg Chest Port 1 View  Result Date: 08/30/2017 CLINICAL DATA:  Endotracheal tube and orogastric tube placement. EXAM: PORTABLE CHEST 1 VIEW COMPARISON:  Chest radiograph performed earlier today at 4:44 p.m. FINDINGS: The patient's endotracheal tube is seen ending 9 cm above the carina. An enteric tube is noted extending below the diaphragm. Three right-sided chest tubes are noted. A residual small right-sided pleural effusion is seen. Increased interstitial markings raise concern for mild pulmonary edema, perhaps mildly improved from the prior study. No definite pneumothorax is seen. The cardiomediastinal silhouette is borderline normal in size. No acute osseous abnormalities are identified. Skin staples are noted along the right side of the chest. IMPRESSION: 1. Endotracheal tube seen ending 9 cm above the carina. This could be advanced 5 cm. 2. Three right-sided chest tubes noted. Residual small right-sided pleural effusion seen. Increased interstitial markings raise concern for mild pulmonary edema, perhaps mildly improved from the prior study. 3. No definite pneumothorax seen. Electronically Signed   By: Garald Balding M.D.   On: 08/30/2017 23:10   Dg Chest Port 1 View  Result Date: 08/30/2017 CLINICAL DATA:  Empyema, post VATS procedure EXAM: PORTABLE CHEST 1 VIEW COMPARISON:  CT chest of 08/29/2017 and chest x-ray of 04/04/2017 FINDINGS: The right chest tube is now present scratch right chest tubes are now present. There is still poor aeration of the right lung with pleural opacity possibly representing residual pleural effusion-empyema. The left lung is clear. The tip of the endotracheal tube is approximately 3.4 cm above the carina. Mild cardiomegaly is stable. IMPRESSION: 1. Right chest tubes present with poor aeration of the right lung. 2. Right pleural opacity remains consistent with volume loss, effusion and possibly empyema. 3. Endotracheal tube tip 3.4 cm  above the carina. Electronically Signed   By: Ivar Drape M.D.   On: 08/30/2017 16:56        Scheduled Meds: . bisacodyl  10 mg Oral Daily  . chlorhexidine gluconate (MEDLINE KIT)  15 mL Mouth Rinse BID  . enoxaparin (LOVENOX) injection  40 mg Subcutaneous Daily  . insulin aspart  0-24 Units Subcutaneous TID WC  . insulin detemir  15 Units Subcutaneous BID  . levalbuterol  0.63 mg Nebulization Q6H  . metoCLOPramide (REGLAN) injection  10 mg Intravenous Q6H  . morphine   Intravenous Q4H  . senna-docusate  1 tablet Oral QHS   Continuous Infusions: . ampicillin-sulbactam (UNASYN) IV Stopped (09/01/17 2641)  . dextrose 5 % and 0.9% NaCl Stopped (08/31/17 1900)  . famotidine (PEPCID) IV Stopped (08/31/17 2159)  . potassium chloride    . vancomycin 1,500 mg (09/01/17 0600)     LOS: 3 days    Time spent: 40 minutes    Americus Scheurich, Geraldo Docker, MD [facility] Pager [phone]   If 7PM-7AM, please contact night-coverage [email or web address] Password [facility] 09/01/2017, 7:43 AM

## 2017-09-01 NOTE — Discharge Instructions (Signed)
Discharge Instructions:  1. You may shower, please wash incisions daily with soap and water and keep dry.  If you wish to cover wounds with dressing you may do so but please keep clean and change daily.  No tub baths or swimming until incisions have completely healed.  If your incisions become red or develop any drainage please call our office at [phone]  2. No Driving until cleared by Dr. Sharee Pimple office and you are no longer using narcotic pain medications  3. Fever of 101.5 for at least 24 hours with no source, please contact our office at [phone]  4. Activity- up as tolerated, please walk at least 3 times per day.  Avoid strenuous activity  5. If any questions or concerns arise, please do not hesitate to contact our office at [phone]

## 2017-09-01 NOTE — Progress Notes (Signed)
2 Days Post-Op Procedure(s) (LRB): VIDEO ASSISTED THORACOSCOPY (VATS)/EMPYEMA (Right) Subjective:  No complaints  Objective: Vital signs in last 24 hours: Temp:  [97.8 F (36.6 C)-98.8 F (37.1 C)] 97.8 F (36.6 C) (05/30 0729) Pulse Rate:  [56-80] 74 (05/30 0700) Cardiac Rhythm: Normal sinus rhythm (05/30 0400) Resp:  [14-27] 17 (05/30 0700) BP: (83-135)/(46-85) 128/81 (05/30 0700) SpO2:  [94 %-100 %] 94 % (05/30 0700) Arterial Line BP: (112-131)/(54-99) 112/54 (05/29 1000) FiO2 (%):  [40 %] 40 % (05/29 0850)  Hemodynamic parameters for last 24 hours:    Intake/Output from previous day: 05/29 0701 - 05/30 0700 In: 4263.3 [P.O.:2085; I.V.:1428.3; IV Piggyback:750] Out: 2160 [Urine:2000; Chest Tube:160] Intake/Output this shift: No intake/output data recorded.  General appearance: alert and cooperative Heart: regular rate and rhythm, S1, S2 normal, no murmur, click, rub or gallop Lungs: diminished breath sounds RLL Extremities: extremities normal, atraumatic, no cyanosis or edema Wound: dressing dry chest tube output low and serous.  Lab Results: Recent Labs    08/31/17 0321 09/01/17 0301  WBC 17.5* 23.0*  HGB 13.1 15.1  HCT 39.1 46.1  PLT 227 266   BMET:  Recent Labs    08/29/17 1505 08/30/17 1447 08/31/17 0321  NA 138 140 143  K 4.3 4.6 5.3*  CL 106  --  112*  CO2 24  --  23  GLUCOSE 125*  --  188*  BUN 17  --  16  CREATININE 0.83  --  0.86  CALCIUM 8.6*  --  7.9*    PT/INR: No results for input(s): LABPROT, INR in the last 72 hours. ABG    Component Value Date/Time   PHART 7.365 08/31/2017 0927   HCO3 15.8 (L) 08/31/2017 1054   TCO2 17 (L) 08/31/2017 1054   ACIDBASEDEF 9.0 (H) 08/31/2017 1054   O2SAT 95.0 08/31/2017 1054   CBG (last 3)  Recent Labs    08/31/17 2327 09/01/17 0352 09/01/17 0727  GLUCAP 144* 151* 125*   CXR stable  Assessment/Plan:  S/P right thoracotomy for drainage of empyema.  Hemodynamically stable and feeling  well DC soft Bard drain. Continue other tubes to suction Continue Vanc and Unasyn per ID IS, ambulation DC foley.  LOS: 3 days    Alleen Borne 09/01/2017

## 2017-09-02 ENCOUNTER — Inpatient Hospital Stay (HOSPITAL_COMMUNITY): Payer: 59

## 2017-09-02 DIAGNOSIS — J154 Pneumonia due to other streptococci: Secondary | ICD-10-CM

## 2017-09-02 LAB — BASIC METABOLIC PANEL
ANION GAP: 4 — AB (ref 5–15)
BUN: 17 mg/dL (ref 6–20)
CHLORIDE: 111 mmol/L (ref 101–111)
CO2: 24 mmol/L (ref 22–32)
Calcium: 7.5 mg/dL — ABNORMAL LOW (ref 8.9–10.3)
Creatinine, Ser: 0.83 mg/dL (ref 0.61–1.24)
GFR calc Af Amer: >60 mL/min (ref >60)
GFR calc non Af Amer: >60 mL/min (ref >60)
Glucose, Bld: 120 mg/dL — ABNORMAL HIGH (ref 65–99)
POTASSIUM: 3.8 mmol/L (ref 3.5–5.1)
SODIUM: 139 mmol/L (ref 135–145)

## 2017-09-02 LAB — GLUCOSE, CAPILLARY
GLUCOSE-CAPILLARY: 104 mg/dL — AB (ref 65–99)
Glucose-Capillary: 107 mg/dL — ABNORMAL HIGH (ref 65–99)
Glucose-Capillary: 130 mg/dL — ABNORMAL HIGH (ref 65–99)
Glucose-Capillary: 89 mg/dL (ref 65–99)
Glucose-Capillary: 94 mg/dL (ref 65–99)

## 2017-09-02 LAB — CBC
HEMATOCRIT: 37.5 % — AB (ref 39.0–52.0)
Hemoglobin: 12.6 g/dL — ABNORMAL LOW (ref 13.0–17.0)
MCH: 29.4 pg (ref 26.0–34.0)
MCHC: 33.6 g/dL (ref 30.0–36.0)
MCV: 87.4 fL (ref 78.0–100.0)
PLATELETS: 262 10*3/uL (ref 150–400)
RBC: 4.29 MIL/uL (ref 4.22–5.81)
RDW: 13.2 % (ref 11.5–15.5)
WBC: 14.1 10*3/uL — AB (ref 4.0–10.5)

## 2017-09-02 LAB — MAGNESIUM: Magnesium: 1.7 mg/dL (ref 1.7–2.4)

## 2017-09-02 MED ORDER — LEVALBUTEROL HCL 0.63 MG/3ML IN NEBU: 0.6300 mg | INHALATION_SOLUTION | Freq: Four times a day (QID) | RESPIRATORY_TRACT | Status: DC | PRN

## 2017-09-02 MED ORDER — ORAL CARE MOUTH RINSE
15.0000 mL | Freq: Two times a day (BID) | OROMUCOSAL | Status: DC
  Administered 2017-09-02 – 2017-09-05 (×4): 15 mL via OROMUCOSAL

## 2017-09-02 MED ORDER — MAGNESIUM OXIDE 400 (241.3 MG) MG PO TABS
400.0000 mg | ORAL_TABLET | Freq: Two times a day (BID) | ORAL | Status: AC
  Administered 2017-09-02 (×2): 400 mg via ORAL
  Filled 2017-09-02 (×2): qty 1

## 2017-09-02 MED ORDER — CHLORHEXIDINE GLUCONATE 0.12 % MT SOLN
OROMUCOSAL | Status: AC
  Filled 2017-09-02: qty 15

## 2017-09-02 MED ORDER — CEFAZOLIN SODIUM-DEXTROSE 2-4 GM/100ML-% IV SOLN
2.0000 g | Freq: Three times a day (TID) | INTRAVENOUS | Status: DC
  Administered 2017-09-02 – 2017-09-03 (×3): 2 g via INTRAVENOUS
  Filled 2017-09-02 (×4): qty 100

## 2017-09-02 NOTE — Progress Notes (Signed)
PROGRESS NOTE    Clayton Jensen  GQQ:[id number] DOB: 09-12-1980 DOA: 08/29/2017 PCP: Mikey Kirschner, MD   Brief Narrative:  37 y.o. 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  Patient reports that it started about 4 days ago when he was rocking his baby when he turned and pulled a muscle in his right side.  He immediately heard a pop and it was extremely painful and had difficulty breathing due to the pain.  The following day he went to urgent care at which point he had a chest x-ray done which was normal and was told that he had costochondritis and was started on NSAIDs and prednisone.  He went home continue to have a lot of pain and continued to have difficulty in breathing due to the pain.  Yesterday he started spiking fevers.  They went back to the urgent care today because of the fevers he had a repeat chest x-ray which showed a large right-sided effusion and he was told to go immediately to the emergency department.  He has a CT scan done today which shows a partially loculated large right-sided pleural effusion.  CT surgery at [facility] is been called who are recommending patient go to the OR to have this drained tonight or in the morning.  He denies any nausea vomiting diarrhea.  Patient given vancomycin and Zosyn in the ED and referred for admission for surgical intervention at [facility].     Subjective: 5/31 A/O x4, increased CP (anterior chest tube  removed).  Remaining chest tube negative sign of air leak.  Negative S OB.  Negative abdominal pain.    Assessment & Plan:   Principal Problem:   Loculated pleural effusion Active Problems:   PNA (pneumonia)   Chronic lower back pain   Sepsis (HCC)   Empyema (HCC)  RIGHT large loculated pleural effusion/Empyema/tissue positive strep viridans  (few) -5/28 S/P VATS 1831m purulent fluid aspirated -Multiple chest tubes in place, draining serosanguineous fluid  -Extubated titrate patient off Precedex - Continue current antibiotic.  Would cover strep viridans.  Still awaiting pleural fluid culture findings. -Continue fentanyl drip for chest tube pain per cardiothoracic surgery - Follow-up with Dr. BGilford RaidCardiothoracic surgery on 6/7 to remove staples and chest tube sutures @ 1000  Sepsis pneumonia? -Unclear source of infection. -See empyema -Chest tube care per cardiothoracic surgery - Incentive spirometry  OSA/OHS -CPAP per respiratory   Morbid Obesity( Body mass index is 50.2 kg/m) -Diet currently clear liquids.  Once started on solid food would limit to 2000 cal/day diet.   Hypomagnesemia - Magnesium oxide 400 mg BID   DVT prophylaxis: Lovenox Code Status: Full Family Communication: Family at bedside for discussion plan of care Disposition Plan: TBD   Consultants:  TCTS     Procedures/Significant Events:  [facility] Thoracotomy/drainage empyema/decortication RIGHT lung    I have personally reviewed and interpreted all radiology studies and my findings are as above.  VENTILATOR SETTINGS:    Cultures 5/27 blood NGTD 5/28 MRSA PCR negative 5/28 RIGHT empyema reintubated for better growth 5/28 RIGHT lung tissue positive strep viridans few    Antimicrobials: Anti-infectives (From admission, onward)   Start     Stop   08/30/17 1800  ampicillin-sulbactam (UNASYN) 1.5 g in  sodium chloride 0.9 % 100 mL IVPB         08/30/17 0300  vancomycin (VANCOCIN) 1,500 mg in sodium chloride 0.9 % 500 mL IVPB         08/29/17 1900  vancomycin (VANCOCIN) IVPB 1000 mg/200 mL premix     08/30/17 1555   08/29/17 1815  cefTRIAXone (ROCEPHIN) 1 g in sodium chloride 0.9 % 100 mL IVPB  Status:  Discontinued     08/30/17 1734   08/29/17 1815  azithromycin (ZITHROMAX) 500 mg in  sodium chloride 0.9 % 250 mL IVPB  Status:  Discontinued     08/31/17 0924   08/29/17 1630  vancomycin (VANCOCIN) IVPB 1000 mg/200 mL premix     08/29/17 1843   08/29/17 1630  piperacillin-tazobactam (ZOSYN) IVPB 3.375 g     08/29/17 1720       Devices      LINES / TUBES:  RIGHT 36 French chest tubes 5/28>> RIGHT 32 French chest tube 5/28>>      Continuous Infusions: . ampicillin-sulbactam (UNASYN) IV Stopped (09/02/17 0225)  . dextrose 5 % and 0.9% NaCl 10 mL/hr at 09/01/17 1514  . potassium chloride    . vancomycin 1,500 mg (09/02/17 0559)     Objective: Vitals:   09/02/17 0354 09/02/17 0459 09/02/17 0600 09/02/17 0700  BP:   (!) 115/56   Pulse:   65 75  Resp:  (!) 22 19 (!) 30  Temp: 98 F (36.7 C)     TempSrc: Oral     SpO2:  97% 96% 96%  Weight:   (!) 384 lb 11.2 oz (174.5 kg)   Height:        Intake/Output Summary (Last 24 hours) at 09/02/2017 0714 Last data filed at 09/02/2017 0700 Gross per 24 hour  Intake 2200 ml  Output 3400 ml  Net -1200 ml   Filed Weights   08/29/17 1414 08/29/17 2200 09/02/17 0600  Weight: (!) 350 lb (158.8 kg) (!) 370 lb 2.4 oz (167.9 kg) (!) 384 lb 11.2 oz (174.5 kg)    Physical Exam:  General: A/O x4, No acute respiratory distress Neck:  Negative scars, masses, torticollis, lymphadenopathy, JVD Lungs: difficult to auscultate secondary to patient's body habitus, appears clear auscultation bilaterally without wheezes or crackles.  Right chest wall tenderness secondary to one chest tube (anterior chest tube just removed) Cardiovascular: Regular rate and rhythm without murmur gallop or rub normal S1 and S2 Abdomen: MORBIDLY OBESE, negative abdominal pain, nondistended, positive soft, bowel sounds, no rebound, no ascites, no appreciable mass Extremities: No significant cyanosis, clubbing, or edema bilateral lower extremities Skin: Negative rashes, lesions, ulcers Psychiatric:  Negative depression, negative anxiety, negative  fatigue, negative mania  Central nervous system:  Cranial nerves II through XII intact, tongue/uvula midline, all extremities muscle strength 5/5, sensation intact throughout,  negative dysarthria, negative expressive aphasia, negative receptive aphasia. .     Data Reviewed: Care during the described time interval was provided by me .  I have reviewed this patient's available data, including medical history, events of note, physical examination, and all test results as part of my evaluation.   CBC: Recent Labs  Lab 08/29/17 1505 08/30/17 1447 08/31/17 0321 09/01/17 0301 09/02/17 0311  WBC 21.9*  --  17.5* 23.0* 14.1*  NEUTROABS 17.9*  --   --   --   --   HGB 14.0 14.3 13.1 15.1 12.6*  HCT 39.8 42.0 39.1 46.1 37.5*  MCV 85.4  --  87.3 89.2  87.4  PLT 224  --  227 266 884   Basic Metabolic Panel: Recent Labs  Lab 08/29/17 1505 08/30/17 1447 08/31/17 0321 08/31/17 1525 09/01/17 0730 09/02/17 0311  NA 138 140 143  --  141 139  K 4.3 4.6 5.3*  --  4.4 3.8  CL 106  --  112*  --  109 111  CO2 24  --  23  --  25 24  GLUCOSE 125*  --  188*  --  115* 120*  BUN 17  --  16  --  16 17  CREATININE 0.83  --  0.86  --  0.81 0.83  CALCIUM 8.6*  --  7.9*  --  8.0* 7.5*  MG  --   --   --  2.3  --  1.7   GFR: Estimated Creatinine Clearance: 198.7 mL/min (by C-G formula based on SCr of 0.83 mg/dL). Liver Function Tests: Recent Labs  Lab 08/29/17 1505 09/01/17 0730  AST 17 75*  ALT 22 60  ALKPHOS 82 78  BILITOT 0.8 0.7  PROT 6.7 5.6*  ALBUMIN 2.7* 1.9*   No results for input(s): LIPASE, AMYLASE in the last 168 hours. No results for input(s): AMMONIA in the last 168 hours. Coagulation Profile: No results for input(s): INR, PROTIME in the last 168 hours. Cardiac Enzymes: Recent Labs  Lab 08/29/17 1505  TROPONINI 0.05*   BNP (last 3 results) No results for input(s): PROBNP in the last 8760 hours. HbA1C: No results for input(s): HGBA1C in the last 72 hours. CBG: Recent  Labs  Lab 09/01/17 1955 09/01/17 2008 09/01/17 2026 09/01/17 2347 09/02/17 0349  GLUCAP 68 62* 73 94 107*   Lipid Profile: No results for input(s): CHOL, HDL, LDLCALC, TRIG, CHOLHDL, LDLDIRECT in the last 72 hours. Thyroid Function Tests: No results for input(s): TSH, T4TOTAL, FREET4, T3FREE, THYROIDAB in the last 72 hours. Anemia Panel: No results for input(s): VITAMINB12, FOLATE, FERRITIN, TIBC, IRON, RETICCTPCT in the last 72 hours. Urine analysis:    Component Value Date/Time   COLORURINE YELLOW 04/04/2017 0832   APPEARANCEUR CLEAR 04/04/2017 0832   LABSPEC 1.017 04/04/2017 0832   PHURINE 6.0 04/04/2017 0832   GLUCOSEU NEGATIVE 04/04/2017 0832   HGBUR SMALL (A) 04/04/2017 0832   BILIRUBINUR NEGATIVE 04/04/2017 0832   KETONESUR NEGATIVE 04/04/2017 0832   PROTEINUR 100 (A) 04/04/2017 0832   UROBILINOGEN 0.2 10/17/2014 0350   NITRITE NEGATIVE 04/04/2017 0832   LEUKOCYTESUR NEGATIVE 04/04/2017 0832   Sepsis Labs: @LABRCNTIP (procalcitonin:4,lacticidven:4)  ) Recent Results (from the past 240 hour(s))  Culture, blood (Routine X 2) w Reflex to ID Panel     Status: None (Preliminary result)   Collection Time: 08/29/17  3:05 PM  Result Value Ref Range Status   Specimen Description RIGHT ANTECUBITAL  Final   Special Requests   Final    BOTTLES DRAWN AEROBIC AND ANAEROBIC Blood Culture adequate volume   Culture   Final    NO GROWTH 3 DAYS Performed at [facility], [address], [place], [place] [address]    Report Status PENDING  Incomplete  Culture, blood (Routine X 2) w Reflex to ID Panel     Status: None (Preliminary result)   Collection Time: 08/29/17  3:43 PM  Result Value Ref Range Status   Specimen Description LEFT ANTECUBITAL  Final   Special Requests   Final    BOTTLES DRAWN AEROBIC AND ANAEROBIC Blood Culture adequate volume   Culture   Final    NO GROWTH  3 DAYS Performed at [facility], [address], [place], [place] [address]    Report Status PENDING   Incomplete  Surgical pcr screen     Status: None   Collection Time: 08/30/17 10:51 AM  Result Value Ref Range Status   MRSA, PCR NEGATIVE NEGATIVE Final   Staphylococcus aureus NEGATIVE NEGATIVE Final    Comment: (NOTE) The Xpert SA Assay (FDA approved for NASAL specimens in patients 23 years of age and older), is one component of a comprehensive surveillance program. It is not intended to diagnose infection nor to guide or monitor treatment. Performed at [facility], [place] [address], [place], [place] [address]   Acid Fast Smear (AFB)     Status: None   Collection Time: 08/30/17  2:44 PM  Result Value Ref Range Status   AFB Specimen Processing Concentration  Final   Acid Fast Smear Negative  Final    Comment: (NOTE) Performed At: [facility] [place], [place] [id number] Rush Farmer MD [id number]    Source (AFB) EMPYEMA  Final    Comment: RIGHT Performed at [facility], [place] [address], [place], [place] [address]   Aerobic/Anaerobic Culture (surgical/deep wound)     Status: None (Preliminary result)   Collection Time: 08/30/17  2:44 PM  Result Value Ref Range Status   Specimen Description EMPYEMA RIGHT  Final   Special Requests PATIENT ON FOLLOWING VANC  Final   Gram Stain   Final    MODERATE WBC PRESENT, PREDOMINANTLY PMN NO ORGANISMS SEEN Performed at [facility], [place] [address] [place], [place] [address]    Culture   Final    CULTURE REINCUBATED FOR BETTER GROWTH NO ANAEROBES ISOLATED; CULTURE IN PROGRESS FOR 5 DAYS    Report Status PENDING  Incomplete  Acid Fast Smear (AFB)     Status: None   Collection Time: 08/30/17  2:51 PM  Result Value Ref Range Status   AFB Specimen Processing Comment  Final    Comment: Tissue Grinding and Digestion/Decontamination   Acid Fast Smear Negative  Final    Comment: (NOTE) Performed At: [facility] [place], [place] [id number] Rush Farmer MD  UU:[id number]    Source (AFB) TISSUE  Final    Comment: RIGHT LUNG Performed at [facility], [place] [address], [place], [place] [address]   Aerobic/Anaerobic Culture (surgical/deep wound)     Status: None (Preliminary result)   Collection Time: 08/30/17  2:51 PM  Result Value Ref Range Status   Specimen Description TISSUE RIGHT LUNG  Final   Special Requests PATIENT ON FOLLOWING VANC  Final   Gram Stain   Final    MODERATE WBC PRESENT, PREDOMINANTLY PMN FEW GRAM POSITIVE COCCI Performed at [facility], [place] [address] [place], [place] [address]    Culture   Final    FEW VIRIDANS STREPTOCOCCUS NO ANAEROBES ISOLATED; CULTURE IN PROGRESS FOR 5 DAYS    Report Status PENDING  Incomplete         Radiology Studies: Dg Chest Port 1 View  Result Date: 09/01/2017 CLINICAL DATA:  Empyema. EXAM: PORTABLE CHEST 1 VIEW COMPARISON:  08/31/2017 FINDINGS: Endotracheal and enteric tubes have been removed. Three right-sided chest tubes remain in place. The cardiac silhouette is accentuated by portable AP technique and mildly low lung volumes. There is some asymmetric volume loss in the right hemithorax. There is a persistent small, partially loculated right pleural effusion. Patchy right  lung opacities greatest in the base and has mildly improved. The left lung remains clear. No pneumothorax is identified. IMPRESSION: 1. Persistent small, partially loculated right pleural effusion with chest tubes in place. No pneumothorax. 2. Mildly improved right lung aeration. Electronically Signed   By: Logan Bores M.D.   On: 09/01/2017 08:41        Scheduled Meds: . bisacodyl  10 mg Oral Daily  . chlorhexidine gluconate (MEDLINE KIT)  15 mL Mouth Rinse BID  . enoxaparin (LOVENOX) injection  40 mg Subcutaneous Daily  . insulin aspart  0-24 Units Subcutaneous TID WC  . insulin detemir  15 Units Subcutaneous Daily  . levalbuterol  0.63 mg Nebulization Q6H  . morphine   Intravenous Q4H  .  pantoprazole  40 mg Oral Daily  . senna-docusate  1 tablet Oral QHS   Continuous Infusions: . ampicillin-sulbactam (UNASYN) IV Stopped (09/02/17 0225)  . dextrose 5 % and 0.9% NaCl 10 mL/hr at 09/01/17 1514  . potassium chloride    . vancomycin 1,500 mg (09/02/17 0559)     LOS: 4 days    Time spent: 40 minutes    Erlean Mealor, Geraldo Docker, MD [facility] Pager [phone]   If 7PM-7AM, please contact night-coverage [email or web address] Password [facility] 09/02/2017, 7:14 AM

## 2017-09-02 NOTE — Progress Notes (Signed)
TCTS BRIEF SICU PROGRESS NOTE  3 Days Post-Op  S/P Procedure(s) (LRB): VIDEO ASSISTED THORACOSCOPY (VATS)/EMPYEMA (Right)   Stable day  Plan: Continue current plan  Purcell Nails, MD 09/02/2017 5:52 PM

## 2017-09-02 NOTE — Progress Notes (Signed)
[name] for Infectious Disease    Date of Admission:  08/29/2017   Total days of antibiotics 5   ID: Clayton Jensen is a 37 y.o. male with empyema from strep viridans Principal Problem:   Loculated pleural effusion Active Problems:   PNA (pneumonia)   Chronic lower back pain   Sepsis (HCC)   Empyema (HCC)    Subjective: Afebrile, 2 chest tubes in place  Leukocytosis is trending down  Medications:  . bisacodyl  10 mg Oral Daily  . chlorhexidine gluconate (MEDLINE KIT)  15 mL Mouth Rinse BID  . enoxaparin (LOVENOX) injection  40 mg Subcutaneous Daily  . magnesium oxide  400 mg Oral BID  . morphine   Intravenous Q4H  . pantoprazole  40 mg Oral Daily  . senna-docusate  1 tablet Oral QHS    Objective: Vital signs in last 24 hours: Temp:  [97.9 F (36.6 C)-98.6 F (37 C)] 98.6 F (37 C) (05/31 1100) Pulse Rate:  [43-80] 75 (05/31 0700) Resp:  [15-30] 25 (05/31 1100) BP: (88-146)/(44-92) 114/51 (05/31 1100) SpO2:  [93 %-100 %] 95 % (05/31 0800) Weight:  [384 lb 11.2 oz (174.5 kg)] 384 lb 11.2 oz (174.5 kg) (05/31 0600)  Physical Exam  Constitutional: He is oriented to person, place, and time. He appears well-developed and well-nourished. No distress.  HENT:  Mouth/Throat: Oropharynx is clear and moist. No oropharyngeal exudate.  Cardiovascular: Normal rate, regular rhythm and normal heart sounds. Exam reveals no gallop and no friction rub.  No murmur heard.  Pulmonary/Chest: Effort normal and breath sounds normal. Rhonchi+. Chest tube with serosanginous fluid in chest tube Abdominal: Soft. Bowel sounds are decreased. He exhibits no distension. There is no tenderness.  Neurological: He is alert and oriented to person, place, and time.  Skin: Skin is warm and dry. No rash noted. No erythema.  Psychiatric: He has a normal mood and affect. His behavior is normal.      Lab Results Recent Labs    09/01/17 0301 09/01/17 0730 09/02/17 0311  WBC 23.0*  --   14.1*  HGB 15.1  --  12.6*  HCT 46.1  --  37.5*  NA  --  141 139  K  --  4.4 3.8  CL  --  109 111  CO2  --  25 24  BUN  --  16 17  CREATININE  --  0.81 0.83   Liver Panel Recent Labs    09/01/17 0730  PROT 5.6*  ALBUMIN 1.9*  AST 75*  ALT 60  ALKPHOS 78  BILITOT 0.7    Microbiology: Strep viridans Studies/Results: Dg Chest Port 1 View  Result Date: 09/02/2017 CLINICAL DATA:  Chest tube placement.  Empyema. EXAM: PORTABLE CHEST 1 VIEW COMPARISON:  Yesterday. FINDINGS: Stable right chest tube without pneumothorax. Small amount of right lateral pleural fluid, slightly increased. A right basilar chest tube remains in place as well. Stable enlarged cardiac silhouette, prominence of the interstitial markings and right lung patchy opacities. Unremarkable bones. IMPRESSION: 1. Slight increase in a small laterally loculated right pleural effusion or empyema. 2. No significant change in probable pneumonia in the right lung. 3. Stable cardiomegaly and interstitial lung disease. Electronically Signed   By: Claudie Revering M.D.   On: 09/02/2017 08:50   Dg Chest Port 1 View  Result Date: 09/01/2017 CLINICAL DATA:  Empyema. EXAM: PORTABLE CHEST 1 VIEW COMPARISON:  08/31/2017 FINDINGS: Endotracheal and enteric tubes have been removed. Three right-sided chest tubes  remain in place. The cardiac silhouette is accentuated by portable AP technique and mildly low lung volumes. There is some asymmetric volume loss in the right hemithorax. There is a persistent small, partially loculated right pleural effusion. Patchy right lung opacities greatest in the base and has mildly improved. The left lung remains clear. No pneumothorax is identified. IMPRESSION: 1. Persistent small, partially loculated right pleural effusion with chest tubes in place. No pneumothorax. 2. Mildly improved right lung aeration. Electronically Signed   By: Logan Bores M.D.   On: 09/01/2017 08:41    Assessment/Plan: Empyema  2/2 strep  viridans = will place on cefazolin 2gm IV Q 8hr. And discontinue amp/sub and vancomycin. Continue on Iv abtx while hospitalized and will see how he is improving to decide if can switch to orals sooner than later. Will ask micro lab to further identify strep species  Leukocytosis = improving.  [facility] for Infectious Diseases Cell: [phone] Pager: [phone]  09/02/2017, 12:54 PM

## 2017-09-02 NOTE — Progress Notes (Signed)
3 Days Post-Op Procedure(s) (LRB): VIDEO ASSISTED THORACOSCOPY (VATS)/EMPYEMA (Right) Subjective:  No specific complaints  Objective: Vital signs in last 24 hours: Temp:  [97.9 F (36.6 C)-98.3 F (36.8 C)] 98 F (36.7 C) (05/31 0354) Pulse Rate:  [43-80] 75 (05/31 0700) Cardiac Rhythm: Normal sinus rhythm (05/31 0000) Resp:  [15-30] 30 (05/31 0700) BP: (88-146)/(44-92) 115/56 (05/31 0600) SpO2:  [93 %-100 %] 96 % (05/31 0700) Weight:  [174.5 kg (384 lb 11.2 oz)] 174.5 kg (384 lb 11.2 oz) (05/31 0600)  Hemodynamic parameters for last 24 hours:    Intake/Output from previous day: 05/30 0701 - 05/31 0700 In: 2200 [IV Piggyback:2200] Out: 3400 [Urine:3350; Chest Tube:50] Intake/Output this shift: No intake/output data recorded.  General appearance: alert and cooperative Heart: regular rate and rhythm, S1, S2 normal, no murmur, click, rub or gallop Lungs: clear to auscultation bilaterally Wound: dressing dry chest tube output low, serous  Lab Results: Recent Labs    09/01/17 0301 09/02/17 0311  WBC 23.0* 14.1*  HGB 15.1 12.6*  HCT 46.1 37.5*  PLT 266 262   BMET:  Recent Labs    09/01/17 0730 09/02/17 0311  NA 141 139  K 4.4 3.8  CL 109 111  CO2 25 24  GLUCOSE 115* 120*  BUN 16 17  CREATININE 0.81 0.83  CALCIUM 8.0* 7.5*    PT/INR: No results for input(s): LABPROT, INR in the last 72 hours. ABG    Component Value Date/Time   PHART 7.365 08/31/2017 0927   HCO3 15.8 (L) 08/31/2017 1054   TCO2 17 (L) 08/31/2017 1054   ACIDBASEDEF 9.0 (H) 08/31/2017 1054   O2SAT 95.0 08/31/2017 1054   CBG (last 3)  Recent Labs    09/01/17 2026 09/01/17 2347 09/02/17 0349  GLUCAP 73 94 107*    Assessment/Plan:  S/p right thoracotomy for drainage of empyema  CXR looks stable. Chest tube output low and WBC improving. DC anterior chest tube today and probably posterior chest tube in am.  Culture has only grown a few viridans strep. Continue vanc and Unasyn for  now and await further ID rec. Will need to decide on antibiotic to send home on.  Keep staples and chest tube sutures in for two weeks and will remove in the office.  LOS: 4 days    Alleen Borne 09/02/2017

## 2017-09-02 NOTE — Progress Notes (Signed)
[facility] follow pt with ID team to support Home Infusion/HH needs at DC as ordered if home is the DC location.  If patient discharges after hours, please call [phone].   Sedalia Mutaamela S Chandler 09/02/2017, 1:04 PM

## 2017-09-03 ENCOUNTER — Inpatient Hospital Stay (HOSPITAL_COMMUNITY): Payer: 59

## 2017-09-03 ENCOUNTER — Inpatient Hospital Stay: Payer: Self-pay

## 2017-09-03 LAB — CULTURE, BLOOD (ROUTINE X 2)
CULTURE: NO GROWTH
Culture: NO GROWTH
SPECIAL REQUESTS: ADEQUATE
Special Requests: ADEQUATE

## 2017-09-03 LAB — BASIC METABOLIC PANEL
ANION GAP: 5 (ref 5–15)
BUN: 12 mg/dL (ref 6–20)
CALCIUM: 7.7 mg/dL — AB (ref 8.9–10.3)
CO2: 29 mmol/L (ref 22–32)
Chloride: 103 mmol/L (ref 101–111)
Creatinine, Ser: 0.85 mg/dL (ref 0.61–1.24)
GFR calc Af Amer: >60 mL/min (ref >60)
GLUCOSE: 95 mg/dL (ref 65–99)
POTASSIUM: 4 mmol/L (ref 3.5–5.1)
SODIUM: 137 mmol/L (ref 135–145)

## 2017-09-03 LAB — CBC
HEMATOCRIT: 38.5 % — AB (ref 39.0–52.0)
Hemoglobin: 12.7 g/dL — ABNORMAL LOW (ref 13.0–17.0)
MCH: 28.7 pg (ref 26.0–34.0)
MCHC: 33 g/dL (ref 30.0–36.0)
MCV: 86.9 fL (ref 78.0–100.0)
PLATELETS: 249 10*3/uL (ref 150–400)
RBC: 4.43 MIL/uL (ref 4.22–5.81)
RDW: 13 % (ref 11.5–15.5)
WBC: 14.8 10*3/uL — AB (ref 4.0–10.5)

## 2017-09-03 LAB — SEDIMENTATION RATE: Sed Rate: 50 mm/hr — ABNORMAL HIGH (ref 0–16)

## 2017-09-03 LAB — MAGNESIUM: Magnesium: 1.7 mg/dL (ref 1.7–2.4)

## 2017-09-03 LAB — C-REACTIVE PROTEIN: CRP: 13.3 mg/dL — AB (ref <1.0)

## 2017-09-03 MED ORDER — LEVALBUTEROL HCL 0.63 MG/3ML IN NEBU
0.6300 mg | INHALATION_SOLUTION | Freq: Four times a day (QID) | RESPIRATORY_TRACT | Status: DC
  Administered 2017-09-03 – 2017-09-04 (×6): 0.63 mg via RESPIRATORY_TRACT
  Filled 2017-09-03 (×8): qty 3

## 2017-09-03 MED ORDER — SODIUM CHLORIDE 0.9 % IV SOLN
2.0000 g | INTRAVENOUS | Status: DC
  Administered 2017-09-03 – 2017-09-05 (×13): 2 g via INTRAVENOUS
  Filled 2017-09-03 (×18): qty 2000

## 2017-09-03 NOTE — Progress Notes (Signed)
PROGRESS NOTE    Clayton Jensen  ZOX:[id number] DOB: [birth year] DOA: 08/29/2017 PCP: Merlyn Albert, MD   Brief Narrative:  37 y.o. 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  Patient reports that it started about 4 days ago when he was rocking his baby when he turned and pulled a muscle in his right side.  He immediately heard a pop and it was extremely painful and had difficulty breathing due to the pain.  The following day he went to urgent care at which point he had a chest x-ray done which was normal and was told that he had costochondritis and was started on NSAIDs and prednisone.  He went home continue to have a lot of pain and continued to have difficulty in breathing due to the pain.  Yesterday he started spiking fevers.  They went back to the urgent care today because of the fevers he had a repeat chest x-ray which showed a large right-sided effusion and he was told to go immediately to the emergency department.  He has a CT scan done today which shows a partially loculated large right-sided pleural effusion.  [facility] surgery at [facility] is been called who are recommending patient go to the OR to have this drained tonight or in the morning.  He denies any nausea vomiting diarrhea.  Patient given vancomycin and Zosyn in the ED and referred for admission for surgical intervention at [facility]     Subjective: 6/1 A/O x4, negative CP (all chest tubes removed).  Negative abdominal pain, negative S OB.     Assessment & Plan:   Principal Problem:   Loculated pleural effusion Active Problems:   PNA (pneumonia)   Chronic lower back pain   Sepsis (HCC)   Empyema (HCC)  RIGHT large loculated pleural effusion/Empyema/tissue positive strep viridans (few) -5/28 S/P VATS purulent fluid aspirated -Multiple  chest tubes in place, draining serosanguineous fluid  -Extubated titrate patient off Precedex - Continue current antibiotic.  Would cover strep viridans.  Still awaiting pleural fluid culture findings. -Continue fentanyl drip for chest tube pain per cardiothoracic surgery -6/1 all chest tubes removed -Discussed case with Dr. Jerolyn Center, ID request placement of PICC line for extended period of antibiotic treatment. - Follow-up with Dr. Evelene Croon Cardiothoracic surgery on 6/7 to remove staples and chest tube sutures @ 1000  Sepsis pneumonia? -Unclear source of infection. -See empyema -Chest tube care per cardiothoracic surgery - Incentive spirometry -Flutter valve - Xopenex QID  OSA/OHS -CPAP per respiratory   Morbid Obesity( Body mass index is 50.2 kg/m) -Heart healthy diet: 2000 -calorie/day    Hypomagnesemia - Magnesium oxide 400 mg BID   DVT prophylaxis: Lovenox Code Status: Full Family Communication: None Disposition Plan: TBD   Consultants:  TCTS     Procedures/Significant Events:  5/27 admit to [facility] - transfer to [facility] 5/28 RIGHT Thoracotomy/drainage empyema/decortication RIGHT lung    I have personally reviewed and interpreted all radiology studies and my findings are as above.  VENTILATOR SETTINGS:    Cultures 5/27 blood NGTD 5/28 MRSA PCR negative 5/28 RIGHT empyema reintubated for better growth 5/28 RIGHT lung tissue positive strep viridans few    Antimicrobials: Anti-infectives (From admission, onward)   Start     Stop   09/03/17 1200  ampicillin (OMNIPEN) 2 g  in sodium chloride 0.9 % 100 mL IVPB         09/02/17 1400  ceFAZolin (ANCEF) IVPB 2g/100 mL premix  Status:  Discontinued     09/03/17 1123   09/02/17 0200  ampicillin-sulbactam (UNASYN) 1.5 g in sodium chloride 0.9 % 100 mL IVPB  Status:  Discontinued     09/02/17 1258   09/01/17 1530  ampicillin-sulbactam (UNASYN) 1.5 g in sodium chloride 0.9 % 100 mL IVPB  Status:   Discontinued     09/01/17 2017   08/31/17 2100  vancomycin (VANCOCIN) 1,500 mg in sodium chloride 0.9 % 500 mL IVPB  Status:  Discontinued     09/02/17 1055   08/30/17 1800  ampicillin-sulbactam (UNASYN) 1.5 g in sodium chloride 0.9 % 100 mL IVPB  Status:  Discontinued     09/01/17 1408   08/30/17 0300  vancomycin (VANCOCIN) 1,500 mg in sodium chloride 0.9 % 500 mL IVPB  Status:  Discontinued     08/31/17 1342   08/29/17 1900  vancomycin (VANCOCIN) IVPB 1000 mg/200 mL premix     08/30/17 1555   08/29/17 1815  cefTRIAXone (ROCEPHIN) 1 g in sodium chloride 0.9 % 100 mL IVPB  Status:  Discontinued     08/30/17 1734   08/29/17 1815  azithromycin (ZITHROMAX) 500 mg in sodium chloride 0.9 % 250 mL IVPB  Status:  Discontinued     08/31/17 0924   08/29/17 1630  vancomycin (VANCOCIN) IVPB 1000 mg/200 mL premix     08/29/17 1843   08/29/17 1630  piperacillin-tazobactam (ZOSYN) IVPB 3.375 g     08/29/17 1720        Devices      LINES / TUBES:  RIGHT 36 French chest tubes 5/28>> 5/31 RIGHT 32 French chest tube 5/28>> 6/1      Continuous Infusions: .  ceFAZolin (ANCEF) IV Stopped (09/03/17 0534)  . dextrose 5 % and 0.9% NaCl 10 mL/hr at 09/02/17 1000  . potassium chloride       Objective: Vitals:   09/03/17 0500 09/03/17 0600 09/03/17 0700 09/03/17 0800  BP:  (!) 148/70 140/64 139/73  Pulse: 85 84 86 86  Resp: (!) 26 (!) 24 (!) 26 (!) 24  Temp:      TempSrc:      SpO2: 94% 94% 92% (!) 89%  Weight:      Height:        Intake/Output Summary (Last 24 hours) at 09/03/2017 0846 Last data filed at 09/03/2017 0600 Gross per 24 hour  Intake 2390 ml  Output 1255 ml  Net 1135 ml   Filed Weights   08/29/17 1414 08/29/17 2200 09/02/17 0600  Weight: (!) 350 lb (158.8 kg) (!) 370 lb 2.4 oz (167.9 kg) (!) 384 lb 11.2 oz (174.5 kg)    Physical Exam:  General: A/O x4, No acute respiratory distress Neck:  Negative scars, masses, torticollis, lymphadenopathy, JVD Lungs:  RIGHT  lung fields decreased breath sounds but clear to auscultation.  LEFT lung fields diffuse rhonchi, expiratory wheeze, basilar crackles.  All chest tubes removed area over right chest wall covered and clean. Cardiovascular: Regular rate and rhythm without murmur gallop or rub normal S1 and S2 Abdomen: MORBIDLY OBESE, negative abdominal pain, nondistended, positive soft, bowel sounds, no rebound, no ascites, no appreciable mass Extremities: No significant cyanosis, clubbing, or edema bilateral lower extremities Skin: Negative rashes, lesions, ulcers Psychiatric:  Negative depression, negative anxiety, negative fatigue, negative mania  Central nervous system:  Cranial nerves II  through XII intact, tongue/uvula midline, all extremities muscle strength 5/5, sensation intact throughout,  negative dysarthria, negative expressive aphasia, negative receptive aphasia. .     Data Reviewed: Care during the described time interval was provided by me .  I have reviewed this patient's available data, including medical history, events of note, physical examination, and all test results as part of my evaluation.   CBC: Recent Labs  Lab 08/29/17 1505 08/30/17 1447 08/31/17 0321 09/01/17 0301 09/02/17 0311 09/03/17 0226  WBC 21.9*  --  17.5* 23.0* 14.1* 14.8*  NEUTROABS 17.9*  --   --   --   --   --   HGB 14.0 14.3 13.1 15.1 12.6* 12.7*  HCT 39.8 42.0 39.1 46.1 37.5* 38.5*  MCV 85.4  --  87.3 89.2 87.4 86.9  PLT 224  --  227 266 262 249   Basic Metabolic Panel: Recent Labs  Lab 08/29/17 1505 08/30/17 1447 08/31/17 0321 08/31/17 1525 09/01/17 0730 09/02/17 0311 09/03/17 0226  NA 138 140 143  --  141 139 137  K 4.3 4.6 5.3*  --  4.4 3.8 4.0  CL 106  --  112*  --  109 111 103  CO2 24  --  23  --  25 24 29   GLUCOSE 125*  --  188*  --  115* 120* 95  BUN 17  --  16  --  16 17 12   CREATININE 0.83  --  0.86  --  0.81 0.83 0.85  CALCIUM 8.6*  --  7.9*  --  8.0* 7.5* 7.7*  MG  --   --   --  2.3  --   1.7 1.7   GFR: Estimated Creatinine Clearance: 194 mL/min (by C-G formula based on SCr of 0.85 mg/dL). Liver Function Tests: Recent Labs  Lab 08/29/17 1505 09/01/17 0730  AST 17 75*  ALT 22 60  ALKPHOS 82 78  BILITOT 0.8 0.7  PROT 6.7 5.6*  ALBUMIN 2.7* 1.9*   No results for input(s): LIPASE, AMYLASE in the last 168 hours. No results for input(s): AMMONIA in the last 168 hours. Coagulation Profile: No results for input(s): INR, PROTIME in the last 168 hours. Cardiac Enzymes: Recent Labs  Lab 08/29/17 1505  TROPONINI 0.05*   BNP (last 3 results) No results for input(s): PROBNP in the last 8760 hours. HbA1C: No results for input(s): HGBA1C in the last 72 hours. CBG: Recent Labs  Lab 09/01/17 2026 09/01/17 2347 09/02/17 0349 09/02/17 1118 09/02/17 1530  GLUCAP 73 94 107* 89 130*   Lipid Profile: No results for input(s): CHOL, HDL, LDLCALC, TRIG, CHOLHDL, LDLDIRECT in the last 72 hours. Thyroid Function Tests: No results for input(s): TSH, T4TOTAL, FREET4, T3FREE, THYROIDAB in the last 72 hours. Anemia Panel: No results for input(s): VITAMINB12, FOLATE, FERRITIN, TIBC, IRON, RETICCTPCT in the last 72 hours. Urine analysis:    Component Value Date/Time   COLORURINE YELLOW 04/04/2017 0832   APPEARANCEUR CLEAR 04/04/2017 0832   LABSPEC 1.017 04/04/2017 0832   PHURINE 6.0 04/04/2017 0832   GLUCOSEU NEGATIVE 04/04/2017 0832   HGBUR SMALL (A) 04/04/2017 0832   BILIRUBINUR NEGATIVE 04/04/2017 0832   KETONESUR NEGATIVE 04/04/2017 0832   PROTEINUR 100 (A) 04/04/2017 0832   UROBILINOGEN 0.2 10/17/2014 0350   NITRITE NEGATIVE 04/04/2017 0832   LEUKOCYTESUR NEGATIVE 04/04/2017 0832   Sepsis Labs: @LABRCNTIP (procalcitonin:4,lacticidven:4)  ) Recent Results (from the past 240 hour(s))  Culture, blood (Routine X 2) w Reflex to ID Panel  Status: None   Collection Time: 08/29/17  3:05 PM  Result Value Ref Range Status   Specimen Description RIGHT ANTECUBITAL  Final    Special Requests   Final    BOTTLES DRAWN AEROBIC AND ANAEROBIC Blood Culture adequate volume   Culture   Final    NO GROWTH 5 DAYS Performed at [facility], [address] [place], [place] [address]    Report Status 09/03/2017 FINAL  Final  Culture, blood (Routine X 2) w Reflex to ID Panel     Status: None   Collection Time: 08/29/17  3:43 PM  Result Value Ref Range Status   Specimen Description LEFT ANTECUBITAL  Final   Special Requests   Final    BOTTLES DRAWN AEROBIC AND ANAEROBIC Blood Culture adequate volume   Culture   Final    NO GROWTH 5 DAYS Performed at [facility], [address] [place], [place] [address]    Report Status 09/03/2017 FINAL  Final  Surgical pcr screen     Status: None   Collection Time: 08/30/17 10:51 AM  Result Value Ref Range Status   MRSA, PCR NEGATIVE NEGATIVE Final   Staphylococcus aureus NEGATIVE NEGATIVE Final    Comment: (NOTE) The Xpert SA Assay (FDA approved for NASAL specimens in patients 39 years of age and older), is one component of a comprehensive surveillance program. It is not intended to diagnose infection nor to guide or monitor treatment. Performed at [facility], [address] [place], [place] [address]   Fungus Culture With Stain     Status: None (Preliminary result)   Collection Time: 08/30/17  2:44 PM  Result Value Ref Range Status   Fungus Stain Final report  Final    Comment: (NOTE) Performed At: [facility] [place] [address] [place], [place] [id number] Jolene SchimkeNagendra Sanjai MD OZ:[id number]:[phone]    Fungus (Mycology) Culture PENDING  Incomplete   Fungal Source EMPYEMA  Final    Comment: RIGHT Performed at [facility], [address] [place], [place] [address]   Acid Fast Smear (AFB)     Status: None   Collection Time: 08/30/17  2:44 PM  Result Value Ref Range Status   AFB Specimen Processing Concentration  Final   Acid Fast Smear Negative  Final    Comment: (NOTE) Performed At: [facility]  [place] [address] [place], [place] [id number] Jolene SchimkeNagendra Sanjai MD UU:[id number]:[phone]    Source (AFB) EMPYEMA  Final    Comment: RIGHT Performed at [facility], [address] [place], [place] [address]   Aerobic/Anaerobic Culture (surgical/deep wound)     Status: None (Preliminary result)   Collection Time: 08/30/17  2:44 PM  Result Value Ref Range Status   Specimen Description EMPYEMA RIGHT  Final   Special Requests PATIENT ON FOLLOWING VANC  Final   Gram Stain   Final    MODERATE WBC PRESENT, PREDOMINANTLY PMN NO ORGANISMS SEEN Performed at [facility], [address] [place], [place] [address]    Culture   Final    ABUNDANT VIRIDANS STREPTOCOCCUS CULTURE REINCUBATED FOR BETTER GROWTH NO ANAEROBES ISOLATED; CULTURE IN PROGRESS FOR 5 DAYS    Report Status PENDING  Incomplete  Fungus Culture Result     Status: None   Collection Time: 08/30/17  2:44 PM  Result Value Ref Range Status   Result 1 Comment  Final    Comment: (NOTE) KOH/Calcofluor preparation:  no fungus observed. Performed At: [facility] [place] [phone]  [address] [place], [place] [address] Jolene Schimke MD WU:[id number] Performed at [facility], [address] [place], [place] [address]   Fungus Culture With Stain     Status: None (Preliminary result)   Collection Time: 08/30/17  2:51 PM  Result Value Ref Range Status   Fungus Stain Final report  Final    Comment: (NOTE) Performed At: [facility] [address] [place], [place] [id number] Jolene Schimke MD [id number]    Fungus (Mycology) Culture PENDING  Incomplete   Fungal Source TISSUE  Final    Comment: RIGHT LUNG Performed at [facility], [address] [place], [place] [address]   Acid Fast Smear (AFB)     Status: None   Collection Time: 08/30/17  2:51 PM  Result Value Ref Range Status   AFB Specimen Processing Comment  Final    Comment: Tissue Grinding and Digestion/Decontamination   Acid Fast Smear  Negative  Final    Comment: (NOTE) Performed At: [facility] [address] [place], [place] [id number] Jolene Schimke MD [id number]    Source (AFB) TISSUE  Final    Comment: RIGHT LUNG Performed at [facility], [address] [place], [place] [address]   Aerobic/Anaerobic Culture (surgical/deep wound)     Status: None (Preliminary result)   Collection Time: 08/30/17  2:51 PM  Result Value Ref Range Status   Specimen Description TISSUE RIGHT LUNG  Final   Special Requests PATIENT ON FOLLOWING VANC  Final   Gram Stain   Final    MODERATE WBC PRESENT, PREDOMINANTLY PMN FEW GRAM POSITIVE COCCI Performed at [facility], [address], [place], [place] [address]    Culture   Final    FEW VIRIDANS STREPTOCOCCUS NO ANAEROBES ISOLATED; CULTURE IN PROGRESS FOR 5 DAYS    Report Status PENDING  Incomplete  Fungus Culture Result     Status: None   Collection Time: 08/30/17  2:51 PM  Result Value Ref Range Status   Result 1 Comment  Final    Comment: (NOTE) KOH/Calcofluor preparation:  no fungus observed. Performed At: [facility] [address] [place], [place] [id number] Jolene Schimke MD ZS:[id number] Performed at [facility], [address] [place], [place] [address]          Radiology Studies: Dg Chest Port 1 View  Result Date: 09/03/2017 CLINICAL DATA:  Followup empyema. EXAM: PORTABLE CHEST 1 VIEW COMPARISON:  09/02/2017 and older exams. FINDINGS: Since the most recent prior exam, the superior right chest tube has been removed. No convincing pneumothorax. The inferior right chest tube is stable. Right lung interstitial airspace opacities are similar to the previous day's study. Loculated pleural fluid along the periphery of the right lung and at the right lung base is stable. Left lung remains essentially clear. IMPRESSION: 1. Status post removal of the superior right chest tube since the previous day's study. 2. No convincing  pneumothorax.  No other change. Electronically Signed   By: Amie Portland M.D.   On: 09/03/2017 08:09   Dg Chest Port 1 View  Result Date: 09/02/2017 CLINICAL DATA:  Chest tube placement.  Empyema. EXAM: PORTABLE CHEST 1 VIEW COMPARISON:  Yesterday. FINDINGS: Stable right chest tube without pneumothorax. Small amount of right lateral pleural fluid, slightly increased. A right basilar chest tube remains in place as well. Stable enlarged cardiac silhouette, prominence of the interstitial markings and right lung patchy opacities. Unremarkable bones. IMPRESSION: 1. Slight increase in a  small laterally loculated right pleural effusion or empyema. 2. No significant change in probable pneumonia in the right lung. 3. Stable cardiomegaly and interstitial lung disease. Electronically Signed   By: Beckie Salts M.D.   On: 09/02/2017 08:50        Scheduled Meds: . bisacodyl  10 mg Oral Daily  . enoxaparin (LOVENOX) injection  40 mg Subcutaneous Daily  . mouth rinse  15 mL Mouth Rinse BID  . morphine   Intravenous Q4H  . pantoprazole  40 mg Oral Daily  . senna-docusate  1 tablet Oral QHS   Continuous Infusions: .  ceFAZolin (ANCEF) IV Stopped (09/03/17 0534)  . dextrose 5 % and 0.9% NaCl 10 mL/hr at 09/02/17 1000  . potassium chloride       LOS: 5 days    Time spent: 40 minutes    WOODS, Roselind Messier, MD [facility] Pager [phone]   If 7PM-7AM, please contact night-coverage [email or web address] Password [facility] 09/03/2017, 8:46 AM

## 2017-09-03 NOTE — Progress Notes (Signed)
PHARMACY CONSULT NOTE FOR:  OUTPATIENT  PARENTERAL ANTIBIOTIC THERAPY (OPAT)  Indication: Strep. Viridans empyema / PNA Regimen: Ampicillin 12g IV continuous infusion Q24h  End date: 09/11/17  IV antibiotic discharge orders are pended. To discharging provider:  please sign these orders via discharge navigator,  Select New Orders & click on the button choice - Manage This Unsigned Work.    Thank you for allowing pharmacy to be a part of this patient's care.  Clayton Jensen,Clayton Jensen 09/03/2017, 1:43 PM

## 2017-09-03 NOTE — Progress Notes (Signed)
      [address]       [place] [id number]             [phone]        CARDIOTHORACIC SURGERY PROGRESS NOTE   R4 Days Post-Op Procedure(s) (LRB): VIDEO ASSISTED THORACOSCOPY (VATS)/EMPYEMA (Right)  Subjective: Sore in chest assoc with chest tube.  No SOB.  Ambulating well w/ assistance  Objective: Vital signs: BP Readings from Last 1 Encounters:  09/03/17 139/73   Pulse Readings from Last 1 Encounters:  09/03/17 86   Resp Readings from Last 1 Encounters:  09/03/17 (!) 24   Temp Readings from Last 1 Encounters:  09/03/17 99.2 F (37.3 C) (Oral)    Hemodynamics:    Physical Exam:  Rhythm:   sinus  Breath sounds: clear  Heart sounds:  RRR  Incisions:  Dressings dry, intact  Abdomen:  Soft, non-distended, non-tender  Extremities:  Warm, well-perfused  Chest tubes:  low volume thin serosanguinous output, no air leak    Intake/Output from previous day: 05/31 0701 - 06/01 0700 In: 2640 [P.O.:1700; I.V.:540; IV Piggyback:400] Out: 1255 [Urine:1075; Chest Tube:180] Intake/Output this shift: No intake/output data recorded.  Lab Results:  CBC: Recent Labs    09/02/17 0311 09/03/17 0226  WBC 14.1* 14.8*  HGB 12.6* 12.7*  HCT 37.5* 38.5*  PLT 262 249    BMET:  Recent Labs    09/02/17 0311 09/03/17 0226  NA 139 137  K 3.8 4.0  CL 111 103  CO2 24 29  GLUCOSE 120* 95  BUN 17 12  CREATININE 0.83 0.85  CALCIUM 7.5* 7.7*     PT/INR:  No results for input(s): LABPROT, INR in the last 72 hours.  CBG (last 3)  Recent Labs    09/02/17 0349 09/02/17 1118 09/02/17 1530  GLUCAP 107* 89 130*    ABG    Component Value Date/Time   PHART 7.365 08/31/2017 0927   PCO2ART 37.7 08/31/2017 0927   PO2ART 103.0 08/31/2017 0927   HCO3 15.8 (L) 08/31/2017 1054   TCO2 17 (L) 08/31/2017 1054   ACIDBASEDEF 9.0 (H) 08/31/2017 1054   O2SAT 95.0 08/31/2017 1054    CXR: PORTABLE CHEST 1 VIEW  COMPARISON:  09/02/2017 and older  exams.  FINDINGS: Since the most recent prior exam, the superior right chest tube has been removed. No convincing pneumothorax. The inferior right chest tube is stable.  Right lung interstitial airspace opacities are similar to the previous day's study. Loculated pleural fluid along the periphery of the right lung and at the right lung base is stable.  Left lung remains essentially clear.  IMPRESSION: 1. Status post removal of the superior right chest tube since the previous day's study. 2. No convincing pneumothorax.  No other change.   Electronically Signed   By: Amie Portlandavid  Ormond M.D.   On: 09/03/2017 08:09  Assessment/Plan: S/P Procedure(s) (LRB): VIDEO ASSISTED THORACOSCOPY (VATS)/EMPYEMA (Right)  Clinically stable Minimal chest tube output - will d/c Stop PCA after tube is out and d/c IV fluids Mobilize Continue IV cefazolin per ID team Transfer 4E Remainder of care per Internal Medicine and ID teams  Purcell Nailslarence H Justina Bertini, MD 09/03/2017 9:02 AM

## 2017-09-03 NOTE — Progress Notes (Addendum)
[facility]    Date of Admission:  08/29/2017   Total days of antibiotics 6         ID: Clayton Jensen is a 37 y.o. male with strep intermedius (part of strep milleri group) pna and empyema Principal Problem:   Loculated pleural effusion Active Problems:   PNA (pneumonia)   Chronic lower back pain   Sepsis (HCC)   Empyema (HCC)    Subjective: Afebrile. Has had all chest tube removed. Using his IC routinely. Still having some phlegm that is difficult to bring up.  Medications:  . bisacodyl  10 mg Oral Daily  . enoxaparin (LOVENOX) injection  40 mg Subcutaneous Daily  . mouth rinse  15 mL Mouth Rinse BID  . pantoprazole  40 mg Oral Daily  . senna-docusate  1 tablet Oral QHS    Objective: Vital signs in last 24 hours: Temp:  [98.5 F (36.9 C)-99.8 F (37.7 C)] 98.8 F (37.1 C) (06/01 0800) Pulse Rate:  [72-88] 75 (06/01 0900) Resp:  [15-35] 22 (06/01 0900) BP: (123-150)/(53-79) 141/69 (06/01 0900) SpO2:  [91 %-100 %] 92 % (06/01 0900) Physical Exam  Constitutional: He is oriented to person, place, and time. He appears well-developed and well-nourished. No distress.  HENT:  Mouth/Throat: Oropharynx is clear and moist. No oropharyngeal exudate.  Cardiovascular: Normal rate, regular rhythm and normal heart sounds. Exam reveals no gallop and no friction rub.  No murmur heard.  Pulmonary/Chest: Effort normal and breath sounds normal. Mild rhonchi bilaterally  Abdominal: Soft. Bowel sounds are normal. He exhibits no distension. There is no tenderness.  Neurological: He is alert and oriented to person, place, and time.  Skin: Skin is warm and dry. No rash noted. No erythema.  Psychiatric: He has a normal mood and affect. His behavior is normal.    Lab Results Recent Labs    09/02/17 0311 09/03/17 0226  WBC 14.1* 14.8*  HGB 12.6* 12.7*  HCT 37.5* 38.5*  NA 139 137  K 3.8 4.0  CL 111 103  CO2 24 29  BUN 17 12  CREATININE 0.83 0.85    Liver Panel Recent Labs    09/01/17 0730  PROT 5.6*  ALBUMIN 1.9*  AST 75*  ALT 60  ALKPHOS 78  BILITOT 0.7    Microbiology: 5/28 viridans strep group on tissue and empyema fluid --micro reports strep intermedius S PCN Studies/Results: Dg Chest Port 1 View  Result Date: 09/03/2017 CLINICAL DATA:  Followup empyema. EXAM: PORTABLE CHEST 1 VIEW COMPARISON:  09/02/2017 and older exams. FINDINGS: Since the most recent prior exam, the superior right chest tube has been removed. No convincing pneumothorax. The inferior right chest tube is stable. Right lung interstitial airspace opacities are similar to the previous day's study. Loculated pleural fluid along the periphery of the right lung and at the right lung base is stable. Left lung remains essentially clear. IMPRESSION: 1. Status post removal of the superior right chest tube since the previous day's study. 2. No convincing pneumothorax.  No other change. Electronically Signed   By: Amie Portland M.D.   On: 09/03/2017 08:09   Dg Chest Port 1 View  Result Date: 09/02/2017 CLINICAL DATA:  Chest tube placement.  Empyema. EXAM: PORTABLE CHEST 1 VIEW COMPARISON:  Yesterday. FINDINGS: Stable right chest tube without pneumothorax. Small amount of right lateral pleural fluid, slightly increased. A right basilar chest tube remains in place as well. Stable enlarged cardiac silhouette, prominence of the interstitial  markings and right lung patchy opacities. Unremarkable bones. IMPRESSION: 1. Slight increase in a small laterally loculated right pleural effusion or empyema. 2. No significant change in probable pneumonia in the right lung. 3. Stable cardiomegaly and interstitial lung disease. Electronically Signed   By: Beckie SaltsSteven  Reid M.D.   On: 09/02/2017 08:50     Assessment/Plan: Streptococcal pneumonia/ empyema s/p decortication = strep intermedius is part of strep milleri known for abscess formation and associated with empyema. Part of oral and GI  flora.  - recommend to get panorex to see if he has any abscess tooth that should be addressed. Pt has hx of poor dentition  - will narrow abtx to Iv ampicillin 2gm iv q4hr -  Please have picc line placed to treat for 8 more days of IV therapy (14 d course) then will do follow up to see switch to oral abtx - for discharge, can do continuous ampicillin so that only needs to hook up once a day. - after he finishes his iv abtx, please give him rx for amoxicillin 500mg  PO TID x 10d.  We will see him back in the ID clinic in a week   [facility]Cynthia Kelia [facility] Cell: [phone] Pager: [phone]  09/03/2017, 11:17 AM

## 2017-09-04 ENCOUNTER — Inpatient Hospital Stay (HOSPITAL_COMMUNITY): Payer: 59

## 2017-09-04 DIAGNOSIS — K089 Disorder of teeth and supporting structures, unspecified: Secondary | ICD-10-CM

## 2017-09-04 DIAGNOSIS — J154 Pneumonia due to other streptococci: Secondary | ICD-10-CM

## 2017-09-04 DIAGNOSIS — Z8619 Personal history of other infectious and parasitic diseases: Secondary | ICD-10-CM

## 2017-09-04 LAB — AEROBIC/ANAEROBIC CULTURE W GRAM STAIN (SURGICAL/DEEP WOUND)

## 2017-09-04 LAB — AEROBIC/ANAEROBIC CULTURE (SURGICAL/DEEP WOUND)

## 2017-09-04 MED ORDER — AMPICILLIN IV (FOR PTA / DISCHARGE USE ONLY): 12.0000 g | INTRAVENOUS | 0 refills | Status: DC

## 2017-09-04 MED ORDER — SODIUM CHLORIDE 0.9% FLUSH
10.0000 mL | Freq: Two times a day (BID) | INTRAVENOUS | Status: DC
  Administered 2017-09-04 – 2017-09-05 (×3): 10 mL

## 2017-09-04 MED ORDER — LEVALBUTEROL HCL 0.63 MG/3ML IN NEBU
0.6300 mg | INHALATION_SOLUTION | Freq: Two times a day (BID) | RESPIRATORY_TRACT | Status: DC
  Administered 2017-09-05: 0.63 mg via RESPIRATORY_TRACT
  Filled 2017-09-04: qty 3

## 2017-09-04 MED ORDER — CHLORHEXIDINE GLUCONATE CLOTH 2 % EX PADS
6.0000 | MEDICATED_PAD | Freq: Every day | CUTANEOUS | Status: DC
  Administered 2017-09-04: 6 via TOPICAL

## 2017-09-04 MED ORDER — SODIUM CHLORIDE 0.9% FLUSH: 10.0000 mL | INTRAVENOUS | Status: DC | PRN

## 2017-09-04 NOTE — Progress Notes (Signed)
Patient arrived from 2 heart. Vital signs obtained and patient placed on telemetry monitor and verified with CCMD.  Patient sitting in chair. Family in room will monitor patient. Jessey Huyett, Randall AnKristin Jessup RN

## 2017-09-04 NOTE — Progress Notes (Signed)
      [address]       [place] [id number]             [phone]        CARDIOTHORACIC SURGERY PROGRESS NOTE   R5 Days Post-Op Procedure(s) (LRB): VIDEO ASSISTED THORACOSCOPY (VATS)/EMPYEMA (Right)  Subjective: No complaints.  Feels better w/ tube out  Objective: Vital signs: BP Readings from Last 1 Encounters:  09/04/17 (!) 148/82   Pulse Readings from Last 1 Encounters:  09/04/17 89   Resp Readings from Last 1 Encounters:  09/04/17 (!) 22   Temp Readings from Last 1 Encounters:  09/04/17 99.6 F (37.6 C) (Oral)    Hemodynamics:    Physical Exam:  Rhythm:   sinus  Breath sounds: clear  Heart sounds:  RRR  Incisions:  Dressing dry, intact  Abdomen:  Soft, non-distended, non-tender  Extremities:  Warm, well-perfused    Intake/Output from previous day: 06/01 0701 - 06/02 0700 In: 810 [P.O.:360; IV Piggyback:450] Out: 500 [Urine:500] Intake/Output this shift: No intake/output data recorded.  Lab Results:  CBC: Recent Labs    09/02/17 0311 09/03/17 0226  WBC 14.1* 14.8*  HGB 12.6* 12.7*  HCT 37.5* 38.5*  PLT 262 249    BMET:  Recent Labs    09/02/17 0311 09/03/17 0226  NA 139 137  K 3.8 4.0  CL 111 103  CO2 24 29  GLUCOSE 120* 95  BUN 17 12  CREATININE 0.83 0.85  CALCIUM 7.5* 7.7*     PT/INR:  No results for input(s): LABPROT, INR in the last 72 hours.  CBG (last 3)  Recent Labs    09/02/17 0349 09/02/17 1118 09/02/17 1530  GLUCAP 107* 89 130*    ABG    Component Value Date/Time   PHART 7.365 08/31/2017 0927   PCO2ART 37.7 08/31/2017 0927   PO2ART 103.0 08/31/2017 0927   HCO3 15.8 (L) 08/31/2017 1054   TCO2 17 (L) 08/31/2017 1054   ACIDBASEDEF 9.0 (H) 08/31/2017 1054   O2SAT 95.0 08/31/2017 1054    CXR: CHEST - 2 VIEW  COMPARISON:  September 03, 2017  FINDINGS: The right-sided chest tube is been removed. Again noted is a right-sided pleural effusion with underlying opacity. The patient was imaged  in a more erect position today resulting in settling of the fluid inferiorly. Taking this into account, the pleural effusion is probably similar in size. There is now an air-fluid level consistent with a small amount of air in the pleural space is well, Hydrea pneumothorax. The cardiomediastinal silhouette is stable. The left lung remains clear. No other changes.  IMPRESSION: 1. Taking difference in patient positioning into account, the right pleural effusion is similar in size in the interval. Opacity continues to underlie the effusion. There is now a small air-fluid level consistent with a small hydropneumothorax. This could be due to air being introduced during chest tube removal but is not specific. Recommend attention on follow-up.  These results will be called to the ordering clinician or representative by the Radiologist Assistant, and communication documented in the PACS or zVision Dashboard.   Electronically Signed   By: Gerome Samavid  Williams III M.D   On: 09/04/2017 07:03  Assessment/Plan: S/P Procedure(s) (LRB): VIDEO ASSISTED THORACOSCOPY (VATS)/EMPYEMA (Right)  Progressing well Mobilize Continue IV ampicillin per ID team Awaiting bed for transfer 4E Anticipate possible d/c home soon Remainder of care per Internal Medicine and ID teams   Purcell Nailslarence H Owen, MD 09/04/2017 9:10 AM

## 2017-09-04 NOTE — Progress Notes (Signed)
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 Patient reports that it started about 4 days ago when he was rocking his baby when he turned and pulled a muscle in his right side. He immediately heard a pop and it was extremely painful and had difficulty breathing due to the pain. The following day he went to urgent care at which point he had a chest x-ray done which was normal and was told that he had costochondritis and was started on NSAIDs and prednisone. He went home continue to have a lot of pain and continued to have difficulty in breathing due to the pain. Yesterday he started spiking fevers. They went back to the urgent care today because of the fevers he had a repeat chest x-ray which showed a large right-sided effusion and he was told to go immediately to the emergency department. He has a CT scan done today which shows a partially loculated large right-sided pleural effusion. CT surgery at [facility] is been called who are recommending patient go to the OR to have this drained tonight or in the morning. He denies any nausea vomiting diarrhea. Patient given vancomycin and Zosyn in the ED and referred for admission for surgical intervention at [facility].       Assessment & Plan:   Principal Problem:   Loculated pleural effusion Active Problems:   PNA (pneumonia)   Chronic lower back pain   Sepsis (HCC)   Empyema (HCC)   Poor dentition   Streptococcal pneumonia (HCC)  RIGHT large loculated pleural effusion/Empyema/tissue positive strep viridans (few) -5/28 S/P VATS purulent fluid aspirated -Multiple chest tubes in place, draining serosanguineous  fluid -Extubated titrate patient off Precedex - Continue current antibiotic.  Would cover strep viridans.  Still awaiting pleural fluid culture findings. -Continue fentanyl drip for chest tube pain per cardiothoracic surgery -6/1 all chest tubes removed -Discussed case with Dr. Jerolyn Center, ID request placement of PICC line for extended period of antibiotic treatment. - Follow-up with Dr. Evelene Croon Cardiothoracic surgery on 6/7 to remove staples and chest tube sutures @ 1000  Sepsis pneumonia? -Unclear source of infection. -See empyema -Chest tube care per cardiothoracic surgery - Incentive spirometry -Flutter valve - Xopenex QID  OSA/OHS -CPAP per respiratory   Morbid Obesity(Body mass index is 50.2 kg/m) -Heart healthy diet: 2000 -calorie/day   Hypomagnesemia - Magnesium oxide 400 mg BID       DVT prophylaxis: Lovenox Code Status full code Family Communication: Wife by the bedside Disposition Plan: Plan to DC home in the next 1 to 2 days as long as he is stable.  With PICC line for IV antibiotics. Consultants:  CT surgery and infectious disease Procedures: 5/28 right thoracotomy and drainage of empyema and decortication of the right lung Antimicrobials:  Ampicillin he and his wife are concerned about low-grade temps Subjective:  he and his wife were concerned about low-grade temp overnight.  His T-max was 100.4.  Objective: Vitals:   09/04/17 0405 09/04/17 0457 09/04/17 0935 09/04/17 1009  BP:    (!) 152/82  Pulse:      Resp:    18  Temp:      TempSrc:      SpO2:  93%  95% 95%  Weight:  (!) 175.7 kg (387 lb 5.6 oz)    Height:        Intake/Output Summary (Last 24 hours) at 09/04/2017 1131 Last data filed at 09/04/2017 1011 Gross per 24 hour  Intake 910 ml  Output 500 ml  Net 410 ml   Filed Weights   08/29/17 2200 09/02/17 0600 09/04/17 0457  Weight: (!) 167.9 kg (370 lb 2.4 oz) (!) 174.5 kg (384 lb 11.2 oz) (!) 175.7 kg (387 lb 5.6 oz)     Examination:  General exam: Appears calm and comfortable  Respiratory system: Clear to auscultation. Respiratory effort normal. Cardiovascular system: S1 & S2 heard, RRR. No JVD, murmurs, rubs, gallops or clicks. No pedal edema. Gastrointestinal system: Abdomen is nondistended, soft and nontender. No organomegaly or masses felt. Normal bowel sounds heard. Central nervous system: Alert and oriented. No focal neurological deficits. Extremities:trace edema Skin: No rashes, lesions or ulcers Psychiatry: Judgement and insight appear normal. Mood & affect appropriate.     Data Reviewed: I have personally reviewed following labs and imaging studies  CBC: Recent Labs  Lab 08/29/17 1505 08/30/17 1447 08/31/17 0321 09/01/17 0301 09/02/17 0311 09/03/17 0226  WBC 21.9*  --  17.5* 23.0* 14.1* 14.8*  NEUTROABS 17.9*  --   --   --   --   --   HGB 14.0 14.3 13.1 15.1 12.6* 12.7*  HCT 39.8 42.0 39.1 46.1 37.5* 38.5*  MCV 85.4  --  87.3 89.2 87.4 86.9  PLT 224  --  227 266 262 249   Basic Metabolic Panel: Recent Labs  Lab 08/29/17 1505 08/30/17 1447 08/31/17 0321 08/31/17 1525 09/01/17 0730 09/02/17 0311 09/03/17 0226  NA 138 140 143  --  141 139 137  K 4.3 4.6 5.3*  --  4.4 3.8 4.0  CL 106  --  112*  --  109 111 103  CO2 24  --  23  --  25 24 29   GLUCOSE 125*  --  188*  --  115* 120* 95  BUN 17  --  16  --  16 17 12   CREATININE 0.83  --  0.86  --  0.81 0.83 0.85  CALCIUM 8.6*  --  7.9*  --  8.0* 7.5* 7.7*  MG  --   --   --  2.3  --  1.7 1.7   GFR: Estimated Creatinine Clearance: 194.7 mL/min (by C-G formula based on SCr of 0.85 mg/dL). Liver Function Tests: Recent Labs  Lab 08/29/17 1505 09/01/17 0730  AST 17 75*  ALT 22 60  ALKPHOS 82 78  BILITOT 0.8 0.7  PROT 6.7 5.6*  ALBUMIN 2.7* 1.9*   No results for input(s): LIPASE, AMYLASE in the last 168 hours. No results for input(s): AMMONIA in the last 168 hours. Coagulation Profile: No results for input(s): INR,  PROTIME in the last 168 hours. Cardiac Enzymes: Recent Labs  Lab 08/29/17 1505  TROPONINI 0.05*   BNP (last 3 results) No results for input(s): PROBNP in the last 8760 hours. HbA1C: No results for input(s): HGBA1C in the last 72 hours. CBG: Recent Labs  Lab 09/01/17 2026 09/01/17 2347 09/02/17 0349 09/02/17 1118 09/02/17 1530  GLUCAP 73 94 107* 89 130*   Lipid Profile: No results for input(s): CHOL, HDL, LDLCALC, TRIG, CHOLHDL, LDLDIRECT in the last 72 hours. Thyroid Function Tests: No results for input(s): TSH, T4TOTAL, FREET4, T3FREE, THYROIDAB in the last 72 hours. Anemia Panel: No results for  input(s): VITAMINB12, FOLATE, FERRITIN, TIBC, IRON, RETICCTPCT in the last 72 hours. Sepsis Labs: Recent Labs  Lab 08/29/17 1513 08/29/17 1808  PROCALCITON  --  0.46  LATICACIDVEN 1.12  --     Recent Results (from the past 240 hour(s))  Culture, blood (Routine X 2) w Reflex to ID Panel     Status: None   Collection Time: 08/29/17  3:05 PM  Result Value Ref Range Status   Specimen Description RIGHT ANTECUBITAL  Final   Special Requests   Final    BOTTLES DRAWN AEROBIC AND ANAEROBIC Blood Culture adequate volume   Culture   Final    NO GROWTH 5 DAYS Performed at [facility], [address], [place], [place] [address]    Report Status 09/03/2017 FINAL  Final  Culture, blood (Routine X 2) w Reflex to ID Panel     Status: None   Collection Time: 08/29/17  3:43 PM  Result Value Ref Range Status   Specimen Description LEFT ANTECUBITAL  Final   Special Requests   Final    BOTTLES DRAWN AEROBIC AND ANAEROBIC Blood Culture adequate volume   Culture   Final    NO GROWTH 5 DAYS Performed at [facility], [address], [place], [place] [address]    Report Status 09/03/2017 FINAL  Final  Surgical pcr screen     Status: None   Collection Time: 08/30/17 10:51 AM  Result Value Ref Range Status   MRSA, PCR NEGATIVE NEGATIVE Final   Staphylococcus aureus NEGATIVE NEGATIVE Final      Comment: (NOTE) The Xpert SA Assay (FDA approved for NASAL specimens in patients 91 years of age and older), is one component of a comprehensive surveillance program. It is not intended to diagnose infection nor to guide or monitor treatment. Performed at [facility], [address], [place], [place] [address]   Fungus Culture With Stain     Status: None (Preliminary result)   Collection Time: 08/30/17  2:44 PM  Result Value Ref Range Status   Fungus Stain Final report  Final    Comment: (NOTE) Performed At: [facility] [address] [place], [place] [id number] Jolene Schimke MD OZ:[id number]    Fungus (Mycology) Culture PENDING  Incomplete   Fungal Source EMPYEMA  Final    Comment: RIGHT Performed at [facility], [address], [place], [place] [address]   Acid Fast Smear (AFB)     Status: None   Collection Time: 08/30/17  2:44 PM  Result Value Ref Range Status   AFB Specimen Processing Concentration  Final   Acid Fast Smear Negative  Final    Comment: (NOTE) Performed At: [facility] [address] [place], [place] [id number] Jolene Schimke MD UU:[id number]    Source (AFB) EMPYEMA  Final    Comment: RIGHT Performed at [facility], [address], [place], [place] [address]   Aerobic/Anaerobic Culture (surgical/deep wound)     Status: None (Preliminary result)   Collection Time: 08/30/17  2:44 PM  Result Value Ref Range Status   Specimen Description EMPYEMA RIGHT  Final   Special Requests PATIENT ON FOLLOWING VANC  Final   Gram Stain   Final    MODERATE WBC PRESENT, PREDOMINANTLY PMN NO ORGANISMS SEEN Performed at [facility], [address] [place], [place] [address]    Culture   Final    ABUNDANT VIRIDANS STREPTOCOCCUS NO ANAEROBES ISOLATED; CULTURE IN PROGRESS FOR 5 DAYS  Report Status PENDING  Incomplete   Organism ID, Bacteria VIRIDANS STREPTOCOCCUS  Final      Susceptibility   Viridans streptococcus -  MIC*    PENICILLIN <=0.06 SENSITIVE Sensitive     CEFTRIAXONE 0.25 SENSITIVE Sensitive     ERYTHROMYCIN 4 RESISTANT Resistant     LEVOFLOXACIN 0.5 SENSITIVE Sensitive     VANCOMYCIN 0.5 SENSITIVE Sensitive     * ABUNDANT VIRIDANS STREPTOCOCCUS  Fungus Culture Result     Status: None   Collection Time: 08/30/17  2:44 PM  Result Value Ref Range Status   Result 1 Comment  Final    Comment: (NOTE) KOH/Calcofluor preparation:  no fungus observed. Performed At: [facility] [address] [place], [place] [id number] Jolene Schimke MD WU:[id number] Performed at [facility], [address], [place], [place] [address]   Fungus Culture With Stain     Status: None (Preliminary result)   Collection Time: 08/30/17  2:51 PM  Result Value Ref Range Status   Fungus Stain Final report  Final    Comment: (NOTE) Performed At: [facility] [address] [place], [place] [id number] Jolene Schimke MD [id number]    Fungus (Mycology) Culture PENDING  Incomplete   Fungal Source TISSUE  Final    Comment: RIGHT LUNG Performed at [facility], [address] [place], [place] [address]   Acid Fast Smear (AFB)     Status: None   Collection Time: 08/30/17  2:51 PM  Result Value Ref Range Status   AFB Specimen Processing Comment  Final    Comment: Tissue Grinding and Digestion/Decontamination   Acid Fast Smear Negative  Final    Comment: (NOTE) Performed At: [facility] [address] [place], [place] [id number] Jolene Schimke MD [id number]    Source (AFB) TISSUE  Final    Comment: RIGHT LUNG Performed at [facility], [address] [place], [place] [address]   Aerobic/Anaerobic Culture (surgical/deep wound)     Status: None (Preliminary result)   Collection Time: 08/30/17  2:51 PM  Result Value Ref Range Status   Specimen Description TISSUE RIGHT LUNG  Final   Special Requests PATIENT ON FOLLOWING VANC  Final   Gram Stain   Final     MODERATE WBC PRESENT, PREDOMINANTLY PMN FEW GRAM POSITIVE COCCI    Culture   Final    FEW VIRIDANS STREPTOCOCCUS NO ANAEROBES ISOLATED; CULTURE IN PROGRESS FOR 5 DAYS    Report Status PENDING  Incomplete   Organism ID, Bacteria VIRIDANS STREPTOCOCCUS  Final      Susceptibility   Viridans streptococcus - MIC*    PENICILLIN <=0.06 SENSITIVE Sensitive     CEFTRIAXONE <=0.12 SENSITIVE Sensitive     ERYTHROMYCIN 2 RESISTANT Resistant     LEVOFLOXACIN 0.5 SENSITIVE Sensitive     VANCOMYCIN Value in next row Sensitive      0.5 SENSITIVEPerformed at [facility], [address] [place], [place] [address]    * FEW VIRIDANS STREPTOCOCCUS  Fungus Culture Result     Status: None   Collection Time: 08/30/17  2:51 PM  Result Value Ref Range Status   Result 1 Comment  Final    Comment: (NOTE) KOH/Calcofluor preparation:  no fungus observed. Performed At: [facility] [address] [place], [place] [id number] Jolene Schimke MD ZS:[id number] Performed at [facility], [address] [place], [place] [address]          Radiology Studies: Dg Orthopantogram  Result Date: 09/03/2017 CLINICAL DATA:  37 year old male with poor dentition EXAM: ORTHOPANTOGRAM/PANORAMIC COMPARISON:  None. FINDINGS: Multiple broken and/or carious teeth. Several teeth are also missing. Periapical lucency is present about the root of the left mandibular first molar consistent with periodontal disease. The mandible is intact. The sinuses appear well aerated. IMPRESSION: 1. Extremely poor dentition with numerous missing, broken and carious teeth. 2. Periapical lucency about the root of the left first mandibular molar consistent with periodontal disease. Electronically Signed   By: Malachy Moan M.D.   On: 09/03/2017 14:13   Dg Chest 2 View  Result Date: 09/04/2017 CLINICAL DATA:  Pleural effusion. EXAM: CHEST - 2 VIEW COMPARISON:  September 03, 2017 FINDINGS: The right-sided chest tube is been removed.  Again noted is a right-sided pleural effusion with underlying opacity. The patient was imaged in a more erect position today resulting in settling of the fluid inferiorly. Taking this into account, the pleural effusion is probably similar in size. There is now an air-fluid level consistent with a small amount of air in the pleural space is well, Hydrea pneumothorax. The cardiomediastinal silhouette is stable. The left lung remains clear. No other changes. IMPRESSION: 1. Taking difference in patient positioning into account, the right pleural effusion is similar in size in the interval. Opacity continues to underlie the effusion. There is now a small air-fluid level consistent with a small hydropneumothorax. This could be due to air being introduced during chest tube removal but is not specific. Recommend attention on follow-up. These results will be called to the ordering clinician or representative by the Radiologist Assistant, and communication documented in the PACS or zVision Dashboard. Electronically Signed   By: Gerome Sam III M.D   On: 09/04/2017 07:03   Dg Chest Port 1 View  Result Date: 09/03/2017 CLINICAL DATA:  Followup empyema. EXAM: PORTABLE CHEST 1 VIEW COMPARISON:  09/02/2017 and older exams. FINDINGS: Since the most recent prior exam, the superior right chest tube has been removed. No convincing pneumothorax. The inferior right chest tube is stable. Right lung interstitial airspace opacities are similar to the previous day's study. Loculated pleural fluid along the periphery of the right lung and at the right lung base is stable. Left lung remains essentially clear. IMPRESSION: 1. Status post removal of the superior right chest tube since the previous day's study. 2. No convincing pneumothorax.  No other change. Electronically Signed   By: Amie Portland M.D.   On: 09/03/2017 08:09   [place] Ekg Site Rite  Result Date: 09/03/2017 If Site Rite image not attached, placement could not be confirmed  due to current cardiac rhythm.       Scheduled Meds: . bisacodyl  10 mg Oral Daily  . Chlorhexidine Gluconate Cloth  6 each Topical Daily  . enoxaparin (LOVENOX) injection  40 mg Subcutaneous Daily  . levalbuterol  0.63 mg Nebulization Q6H  . mouth rinse  15 mL Mouth Rinse BID  . pantoprazole  40 mg Oral Daily  . senna-docusate  1 tablet Oral QHS  . sodium chloride flush  10-40 mL Intracatheter Q12H   Continuous Infusions: . ampicillin (OMNIPEN) IV 2 g (09/04/17 1011)  . potassium chloride       LOS: 6 days     Alwyn Ren, MD [facility]  If 7PM-7AM, please contact night-coverage [email or web address] Password [facility] 09/04/2017, 11:31 AM

## 2017-09-04 NOTE — Progress Notes (Signed)
Peripherally Inserted Central Catheter/Midline Placement  The IV Nurse has discussed with the patient and/or persons authorized to consent for the patient, the purpose of this procedure and the potential benefits and risks involved with this procedure.  The benefits include less needle sticks, lab draws from the catheter, and the patient may be discharged home with the catheter. Risks include, but not limited to, infection, bleeding, blood clot (thrombus formation), and puncture of an artery; nerve damage and irregular heartbeat and possibility to perform a PICC exchange if needed/ordered by physician.  Alternatives to this procedure were also discussed.  Bard Power PICC patient education guide, fact sheet on infection prevention and patient information card has been provided to patient /or left at bedside.  Wife present at bedside.  PICC/Midline Placement Documentation  PICC Single Lumen 09/04/17 PICC Right Cephalic 42 cm 1 cm (Active)  Indication for Insertion or Continuance of Line Home intravenous therapies (PICC only) 09/04/2017  9:16 AM  Exposed Catheter (cm) 1 cm 09/04/2017  9:16 AM  Site Assessment Clean;Dry;Intact 09/04/2017  9:16 AM  Line Status Saline locked;Flushed;Blood return noted 09/04/2017  9:16 AM  Dressing Type Transparent 09/04/2017  9:16 AM  Dressing Status Clean;Dry;Intact;Antimicrobial disc in place 09/04/2017  9:16 AM  Line Care Connections checked and tightened 09/04/2017  9:16 AM  Line Adjustment (NICU/IV Team Only) No 09/04/2017  9:16 AM  Dressing Intervention New dressing 09/04/2017  9:16 AM  Dressing Change Due 09/11/17 09/04/2017  9:16 AM       Elliot Dallyiggs, Sya Nestler Wright 09/04/2017, 9:17 AM

## 2017-09-05 ENCOUNTER — Inpatient Hospital Stay (HOSPITAL_COMMUNITY): Payer: 59

## 2017-09-05 LAB — CBC WITH DIFFERENTIAL/PLATELET
ABS IMMATURE GRANULOCYTES: 0.9 10*3/uL — AB (ref 0.0–0.1)
BASOS PCT: 1 %
Basophils Absolute: 0.1 10*3/uL (ref 0.0–0.1)
Eosinophils Absolute: 0.3 10*3/uL (ref 0.0–0.7)
Eosinophils Relative: 1 %
HEMATOCRIT: 35.7 % — AB (ref 39.0–52.0)
Hemoglobin: 12.1 g/dL — ABNORMAL LOW (ref 13.0–17.0)
Immature Granulocytes: 5 %
Lymphocytes Relative: 16 %
Lymphs Abs: 3.2 10*3/uL (ref 0.7–4.0)
MCH: 29.6 pg (ref 26.0–34.0)
MCHC: 33.9 g/dL (ref 30.0–36.0)
MCV: 87.3 fL (ref 78.0–100.0)
MONO ABS: 1.3 10*3/uL — AB (ref 0.1–1.0)
MONOS PCT: 7 %
Neutro Abs: 13.9 10*3/uL — ABNORMAL HIGH (ref 1.7–7.7)
Neutrophils Relative %: 70 %
PLATELETS: 226 10*3/uL (ref 150–400)
RBC: 4.09 MIL/uL — ABNORMAL LOW (ref 4.22–5.81)
RDW: 12.9 % (ref 11.5–15.5)
WBC: 19.8 10*3/uL — ABNORMAL HIGH (ref 4.0–10.5)

## 2017-09-05 LAB — BASIC METABOLIC PANEL
Anion gap: 4 — ABNORMAL LOW (ref 5–15)
BUN: 9 mg/dL (ref 6–20)
CALCIUM: 8 mg/dL — AB (ref 8.9–10.3)
CO2: 28 mmol/L (ref 22–32)
CREATININE: 0.83 mg/dL (ref 0.61–1.24)
Chloride: 104 mmol/L (ref 101–111)
GFR calc Af Amer: >60 mL/min (ref >60)
GFR calc non Af Amer: >60 mL/min (ref >60)
Glucose, Bld: 111 mg/dL — ABNORMAL HIGH (ref 65–99)
Potassium: 4.7 mmol/L (ref 3.5–5.1)
Sodium: 136 mmol/L (ref 135–145)

## 2017-09-05 MED ORDER — AMOXICILLIN 500 MG PO TABS: 500.0000 mg | ORAL_TABLET | Freq: Three times a day (TID) | ORAL | 0 refills | Status: DC

## 2017-09-05 MED ORDER — FUROSEMIDE 40 MG PO TABS: 40.0000 mg | ORAL_TABLET | Freq: Every day | ORAL | 0 refills | Status: DC

## 2017-09-05 MED ORDER — LEVALBUTEROL HCL 0.63 MG/3ML IN NEBU: 0.6300 mg | INHALATION_SOLUTION | Freq: Three times a day (TID) | RESPIRATORY_TRACT | Status: DC | PRN

## 2017-09-05 MED ORDER — POTASSIUM CHLORIDE CRYS ER 20 MEQ PO TBCR: 20.0000 meq | EXTENDED_RELEASE_TABLET | Freq: Every day | ORAL | 0 refills | Status: DC

## 2017-09-05 MED ORDER — POTASSIUM CHLORIDE CRYS ER 20 MEQ PO TBCR
20.0000 meq | EXTENDED_RELEASE_TABLET | Freq: Every day | ORAL | Status: DC
  Administered 2017-09-05: 20 meq via ORAL
  Filled 2017-09-05 (×2): qty 1

## 2017-09-05 MED ORDER — FUROSEMIDE 10 MG/ML IJ SOLN
40.0000 mg | Freq: Once | INTRAMUSCULAR | Status: AC
  Administered 2017-09-05: 40 mg via INTRAVENOUS
  Filled 2017-09-05: qty 4

## 2017-09-05 MED ORDER — TRAMADOL HCL 50 MG PO TABS: 50.0000 mg | ORAL_TABLET | ORAL | 0 refills | Status: DC | PRN

## 2017-09-05 NOTE — Progress Notes (Signed)
[facility] well as [facility] services for Mr. Martinezgarcia at DC later today.  [facility] will provide in hospital teaching with pt/family.  If patient discharges after hours, please call [phone].   Clayton Jensen 09/05/2017, 9:49 AM

## 2017-09-05 NOTE — Care Management Note (Addendum)
Case Management Note Clayton PieriniKristi Luiz Trumpower RN, BSN Unit 4E-Case Manager [phone]  Patient Details  Name: Clayton HarmanMichael H Jensen MRN: [id number] Date of Birth: [birth year]  Subjective/Objective:    Pt admitted with loculated pl. Effusion s/p VATS                Action/Plan: PTA pt lived at home with spouse- independent with ADLs- pt will need IV abx on discharge for total of 14 days - Picc line has been placed- Pam with [facility] following for IV abx needs- spoke with Pam this am for plan regarding transition home today- Pam will come this afternoon to hook pt up to home pump for discharge and pt teaching at the bedside- Orders have been placed, bedside RN aware of d/c plans.  1230- spoke with pt and wife at bedside to discuss transition plan- both are agreeable to [facility]. Questions answered.   Expected Discharge Date:  09/05/17               Expected Discharge Plan:     In-House Referral:  NA  Discharge planning Services  CM Consult  Post Acute Care Choice:  Home Health Choice offered to:  Patient  DME Arranged:    DME Agency:     HH Arranged:  RN, IV Antibiotics HH Agency:  [facility]  Status of Service:  Completed, signed off  If discussed at Long Length of Stay Meetings, dates discussed:    Discharge Disposition: home/home health   Additional Comments:  Darrold SpanWebster, Cherl Gorney Hall, RN 09/05/2017, 11:49 AM

## 2017-09-05 NOTE — Plan of Care (Signed)
  Problem: Clinical Measurements: Goal: Ability to maintain clinical measurements within normal limits will improve Outcome: Adequate for Discharge Goal: Will remain free from infection Outcome: Adequate for Discharge Goal: Diagnostic test results will improve Outcome: Adequate for Discharge Goal: Respiratory complications will improve Outcome: Adequate for Discharge Goal: Cardiovascular complication will be avoided Outcome: Adequate for Discharge   Problem: Safety: Goal: Ability to remain free from injury will improve Outcome: Adequate for Discharge   Problem: Skin Integrity: Goal: Risk for impaired skin integrity will decrease Outcome: Adequate for Discharge   Problem: Education: Goal: Knowledge of disease or condition will improve Outcome: Adequate for Discharge Goal: Knowledge of the prescribed therapeutic regimen will improve Outcome: Adequate for Discharge   Problem: Clinical Measurements: Goal: Postoperative complications will be avoided or minimized Outcome: Adequate for Discharge   Problem: Respiratory: Goal: Respiratory status will improve Outcome: Adequate for Discharge   Problem: Pain Management: Goal: Pain level will decrease Outcome: Adequate for Discharge   Problem: Skin Integrity: Goal: Wound healing without signs and symptoms infection will improve Outcome: Adequate for Discharge

## 2017-09-05 NOTE — Discharge Summary (Signed)
Physician Discharge Summary  Clayton Jensen GXQ:[id number] DOB: 25-Jan-1981 DOA: 08/29/2017  PCP: Mikey Kirschner, MD  Admit date: 08/29/2017 Discharge date: 09/05/2017  Admitted From:home Disposition: home  Recommendations for Outpatient Follow-up:  1. Follow up with PCP in 1-2 weeks 2. Please obtain BMP/CBC in one week 3. Follow-up with infectious disease Dr. Graylon Good 4. Follow-up with cardiovascular surgery dr Cyndia Bent  Home Health [facility] Equipment/Devices:NONE  Discharge Condition:stable CODE STATUS full Diet recommendation cardiac Brief/Interim Summary: 37 y.o. 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  Patient reports that it started about 4 days ago when he was rocking his baby when he turned and pulled a muscle in his right side.  He immediately heard a pop and it was extremely painful and had difficulty breathing due to the pain.  The following day he went to urgent care at which point he had a chest x-ray done which was normal and was told that he had costochondritis and was started on NSAIDs and prednisone.  He went home continue to have a lot of pain and continued to have difficulty in breathing due to the pain.  Yesterday he started spiking fevers.  They went back to the urgent care today because of the fevers he had a repeat chest x-ray which showed a large right-sided effusion and he was told to go immediately to the emergency department.  He has a CT scan done today which shows a partially loculated large right-sided pleural effusion.  CT surgery at [facility] is been called who are recommending patient go to the OR to have this drained tonight or in the morning.  He denies any nausea vomiting diarrhea.  Patient given vancomycin and Zosyn in the ED and referred for admission for surgical intervention at  [facility]    Discharge Diagnoses:  Principal Problem:   Loculated pleural effusion Active Problems:   PNA (pneumonia)   Chronic lower back pain   Sepsis (HCC)   Empyema (HCC)   Poor dentition   Streptococcal pneumonia (HCC)  RIGHT large loculated pleural effusion/Empyema/tissue positivestrep viridans (few)-streptococcal empyema pneumonia status post decortication. -5/28 S/P VATS 1818m purulent fluid aspirated.  Patient had a PICC line placed 09/04/2017 for IV antibiotics.  He is to follow-up with Dr. BRoger Shelter6/10/2017 to remove staples and chest tube sutures.  And he is to follow-up with infectious disease for ongoing IV antibiotics.  His white count has jumped up to 19.8 from 14.8 few days ago.  We need to keep a close eye on this after discharge to.  He will be discharged on penicillin 12 g continuous infusion for 24 hours.  He will have home health arranged and will set it up at home to finish the course of antibiotics till 09/11/2017.  And then he needs to go on amoxicillin 500 mg 3 times a day for 10 days.  I have also discussed with his wife to have a follow-up with a dentist to make sure he does not have any dental infection or abscess tooth.   OSA/OHS -CPAP Morbid Obesity(Body mass index is 50.2 kg/m) -Heart healthy diet: 2000 -calorie/day  Hypomagnesemia - Magnesium oxide 400 mg BID      Discharge Instructions  Discharge Instructions    Home infusion instructions [facility] May follow AL[facility] GoodwinDosing Protocol; May administer  Cathflo as needed to maintain patency of vascular access device.; Flushing of vascular access device: per [facility] Protocol: 0.9% NaCl pre/post medica...   Complete by:  As directed    Instructions:  May follow [place] Dosing Protocol   Instructions:  May administer Cathflo as needed to maintain patency of vascular access device.   Instructions:  Flushing of vascular access device: per [facility] Protocol: 0.9% NaCl pre/post medication  administration and prn patency; Heparin 100 u/ml, 103m for implanted ports and Heparin 10u/ml, 553mfor all other central venous catheters.   Instructions:  May follow [facility] Anaphylaxis Protocol for First Dose Administration in the home: 0.9% NaCl at 25-50 ml/hr to maintain IV access for protocol meds. Epinephrine 0.3 ml IV/IM PRN and Benadryl 25-50 IV/IM PRN s/s of anaphylaxis.   Instructions:  AdStuckeynfusion Coordinator (RN) to assist per patient IV care needs in the home PRN.     Allergies as of 09/05/2017      Reactions   Celery Oil Shortness Of Breath, Swelling   Swelling of throat      Medication List    STOP taking these medications   ketorolac 10 MG tablet Commonly known as:  TORADOL   predniSONE 10 MG tablet Commonly known as:  DELTASONE     TAKE these medications   acetaminophen 500 MG tablet Commonly known as:  TYLENOL Take 500 mg by mouth every 6 (six) hours as needed for mild pain.   amoxicillin 500 MG tablet Commonly known as:  AMOXIL Take 1 tablet (500 mg total) by mouth 3 (three) times daily. X 10 days, start after completion of IV Antibiotics   ampicillin IVPB Inject 12 g into the vein daily. Continuous ampicillin 12g IV infusion over 24 hrs  Indication: Strep. Viridans Empyema / PNA Last Day of Therapy: 09/11/17 Labs - Once weekly:  CBC/D and BMP, Labs - Every other week:  ESR and CRP   cetirizine 10 MG tablet Commonly known as:  ZYRTEC Take 10 mg by mouth once as needed for allergies.   furosemide 40 MG tablet Commonly known as:  LASIX Take 1 tablet (40 mg total) by mouth daily. For 3 days Start taking on:  09/06/2017   methocarbamol 500 MG tablet Commonly known as:  ROBAXIN Take 500 mg by mouth every 6 (six) hours as needed for muscle spasms.   potassium chloride SA 20 MEQ tablet Commonly known as:  K-DUR,KLOR-CON Take 1 tablet (20 mEq total) by mouth daily. For 3 days Start taking on:  09/06/2017   traMADol 50 MG tablet Commonly known as:   ULTRAM Take 1 tablet (50 mg total) by mouth every 4 (four) hours as needed.            Home Infusion Instuctions  (From admission, onward)        Start     Ordered   09/04/17 0000  Home infusion instructions [facility] May follow A[place]osing Protocol; May administer Cathflo as needed to maintain patency of vascular access device.; Flushing of vascular access device: per [facility]: 0.9% NaCl pre/post medica...    Question Answer Comment  Instructions May follow ACBoulevard Parkosing Protocol   Instructions May administer Cathflo as needed to maintain patency of vascular access device.   Instructions Flushing of vascular access device: per [facility]: 0.9% NaCl pre/post medication administration and prn patency; Heparin 100 u/ml, 41m53mor implanted ports and Heparin 10u/ml, 41ml33mr all other central venous catheters.  Instructions May follow [facility] Anaphylaxis Protocol for First Dose Administration in the home: 0.9% NaCl at 25-50 ml/hr to maintain IV access for protocol meds. Epinephrine 0.3 ml IV/IM PRN and Benadryl 25-50 IV/IM PRN s/s of anaphylaxis.   Instructions [facility] Infusion Coordinator (RN) to assist per patient IV care needs in the home PRN.      09/04/17 1142     Follow-up Information    [facility] [place] Follow up on 09/26/2017.   Specialty:  Cardiothoracic Surgery Why:  Appointment is at 3:30, please get CXR at 3:00 at [place] located on first floor of our office building Contact information: [place], [place] [phone]       [facility] Follow up on 09/13/2017.   Specialty:  Cardiothoracic Surgery Why:  Appointment is at 10:30 for suture and staple removal Contact information: [place], [place]       Carlyle Basques, MD Follow up.    Specialty:  Infectious Diseases Contact information: [place] [address] [place] [address] [phone]          Allergies  Allergen Reactions  . Celery Oil Shortness Of Breath and Swelling    Swelling of throat    Consultations:  Cardiothoracic surgery and infectious disease.   Procedures/Studies: Dg Orthopantogram  Result Date: 09/03/2017 CLINICAL DATA:  37 year old male with poor dentition EXAM: ORTHOPANTOGRAM/PANORAMIC COMPARISON:  None. FINDINGS: Multiple broken and/or carious teeth. Several teeth are also missing. Periapical lucency is present about the root of the left mandibular first molar consistent with periodontal disease. The mandible is intact. The sinuses appear well aerated. IMPRESSION: 1. Extremely poor dentition with numerous missing, broken and carious teeth. 2. Periapical lucency about the root of the left first mandibular molar consistent with periodontal disease. Electronically Signed   By: Jacqulynn Cadet M.D.   On: 09/03/2017 14:13   Dg Chest 2 View  Result Date: 09/05/2017 CLINICAL DATA:  Empyema.  Chest tube removed. EXAM: CHEST - 2 VIEW COMPARISON:  Two-view chest x-ray 09/04/2017 FINDINGS: The heart size is normal. Right pleural effusion and lower lobe airspace disease is unchanged. Mild right-sided edema is also noted. The left lung is clear. There is no significant interval change. The visualized soft tissues and bony thorax are unremarkable. IMPRESSION: 1. Stable appearance of right pleural effusion and asymmetric right-sided airspace disease. 2. Lung volumes remain low. Electronically Signed   By: San Morelle M.D.   On: 09/05/2017 07:35   Dg Chest 2 View  Result Date: 09/04/2017 CLINICAL DATA:  Pleural effusion. EXAM: CHEST - 2 VIEW COMPARISON:  September 03, 2017 FINDINGS: The right-sided chest tube is been removed. Again noted is a right-sided pleural effusion with underlying opacity. The patient was imaged in a more erect position  today resulting in settling of the fluid inferiorly. Taking this into account, the pleural effusion is probably similar in size. There is now an air-fluid level consistent with a small amount of air in the pleural space is well, Hydrea pneumothorax. The cardiomediastinal silhouette is stable. The left lung remains clear. No other changes. IMPRESSION: 1. Taking difference in patient positioning into account, the right pleural effusion is similar in size in the interval. Opacity continues to underlie the effusion. There is now a small air-fluid level consistent with a small hydropneumothorax. This could be due to air being introduced during chest tube removal  but is not specific. Recommend attention on follow-up. These results will be called to the ordering clinician or representative by the Radiologist Assistant, and communication documented in the PACS or zVision Dashboard. Electronically Signed   By: Dorise Bullion III M.D   On: 09/04/2017 07:03   Dg Abd 1 View  Result Date: 08/30/2017 CLINICAL DATA:  Assess orogastric tube placement. EXAM: ABDOMEN - 1 VIEW COMPARISON:  CT of the abdomen and pelvis from 10/17/2014 FINDINGS: The patient's enteric tube is noted ending overlying the body of the stomach. The visualized bowel gas pattern is nonspecific, with distention of the colon with air. No free intra-abdominal air is seen, though evaluation for free air is limited on a single supine view. No acute osseous abnormalities are identified. IMPRESSION: Enteric tube noted ending overlying the body of the stomach. Electronically Signed   By: Garald Balding M.D.   On: 08/30/2017 23:10   Ct Angio Chest Pe W And/or Wo Contrast  Result Date: 08/29/2017 CLINICAL DATA:  Worsening chest x-ray, EXAM: CT ANGIOGRAPHY CHEST WITH CONTRAST TECHNIQUE: Multidetector CT imaging of the chest was performed using the standard protocol during bolus administration of intravenous contrast. Multiplanar CT image reconstructions and MIPs  were obtained to evaluate the vascular anatomy. CONTRAST:  169m ISOVUE-370 IOPAMIDOL (ISOVUE-370) INJECTION 76% COMPARISON:  None. FINDINGS: Cardiovascular: Suboptimal opacification of the pulmonary arteries. No large central pulmonary embolus, but evaluation of the lobar and segmental branches is limited secondary to suboptimal contrast opacification. Normal heart size. No pericardial effusion. Normal caliber thoracic aorta. Mediastinum/Nodes: No enlarged mediastinal, hilar, or axillary lymph nodes. Thyroid gland, trachea, and esophagus demonstrate no significant findings. Lungs/Pleura: Large partial loculated right pleural effusion with compressive atelectasis with only a small area of aerated lung in the upper chest. Left lung is clear. No pneumothorax. Upper Abdomen: No acute upper abdominal abnormality. Low attenuation of the liver as can be seen with hepatic steatosis. Musculoskeletal: No acute osseous abnormality. No aggressive osseous lesion.Broad-based disc osteophyte complex at T11-12. Review of the MIP images confirms the above findings. IMPRESSION: 1. Suboptimal opacification of the pulmonary arteries. No large central pulmonary embolus, but evaluation of the lobar and segmental branches is limited secondary to suboptimal contrast opacification. 2. Large partial loculated right pleural effusion with compressive atelectasis with only a small area of aerated lung in the upper chest. Electronically Signed   By: HKathreen Devoid  On: 08/29/2017 16:40   Dg Chest Port 1 View  Result Date: 09/03/2017 CLINICAL DATA:  Followup empyema. EXAM: PORTABLE CHEST 1 VIEW COMPARISON:  09/02/2017 and older exams. FINDINGS: Since the most recent prior exam, the superior right chest tube has been removed. No convincing pneumothorax. The inferior right chest tube is stable. Right lung interstitial airspace opacities are similar to the previous day's study. Loculated pleural fluid along the periphery of the right lung and at  the right lung base is stable. Left lung remains essentially clear. IMPRESSION: 1. Status post removal of the superior right chest tube since the previous day's study. 2. No convincing pneumothorax.  No other change. Electronically Signed   By: DLajean ManesM.D.   On: 09/03/2017 08:09   Dg Chest Port 1 View  Result Date: 09/02/2017 CLINICAL DATA:  Chest tube placement.  Empyema. EXAM: PORTABLE CHEST 1 VIEW COMPARISON:  Yesterday. FINDINGS: Stable right chest tube without pneumothorax. Small amount of right lateral pleural fluid, slightly increased. A right basilar chest tube remains in place as well. Stable enlarged cardiac silhouette, prominence of  the interstitial markings and right lung patchy opacities. Unremarkable bones. IMPRESSION: 1. Slight increase in a small laterally loculated right pleural effusion or empyema. 2. No significant change in probable pneumonia in the right lung. 3. Stable cardiomegaly and interstitial lung disease. Electronically Signed   By: Claudie Revering M.D.   On: 09/02/2017 08:50   Dg Chest Port 1 View  Result Date: 09/01/2017 CLINICAL DATA:  Empyema. EXAM: PORTABLE CHEST 1 VIEW COMPARISON:  08/31/2017 FINDINGS: Endotracheal and enteric tubes have been removed. Three right-sided chest tubes remain in place. The cardiac silhouette is accentuated by portable AP technique and mildly low lung volumes. There is some asymmetric volume loss in the right hemithorax. There is a persistent small, partially loculated right pleural effusion. Patchy right lung opacities greatest in the base and has mildly improved. The left lung remains clear. No pneumothorax is identified. IMPRESSION: 1. Persistent small, partially loculated right pleural effusion with chest tubes in place. No pneumothorax. 2. Mildly improved right lung aeration. Electronically Signed   By: Logan Bores M.D.   On: 09/01/2017 08:41   Dg Chest Port 1 View  Result Date: 08/31/2017 CLINICAL DATA:  Endotracheal tube  placement. Follow-up empyema. EXAM: PORTABLE CHEST 1 VIEW COMPARISON:  Chest radiograph performed 08/30/2017 FINDINGS: The patient's endotracheal tube is seen ending 3-4 cm above the carina. An enteric tube is noted extending below the diaphragm. A mildly loculated small right-sided pleural effusion is again noted, with 3 right-sided chest tubes. Underlying vascular congestion is noted, with increased interstitial markings, concerning for slightly worsened pulmonary edema. Underlying pneumonia is a concern. No significant pneumothorax is seen. The cardiomediastinal silhouette is enlarged. No acute osseous abnormalities are seen. IMPRESSION: 1. Endotracheal tube seen ending 3-4 cm above the carina. 2. Mildly loculated small right-sided pleural effusion again noted, with 3 right-sided chest tubes. 3. Underlying vascular congestion, with increased interstitial markings, concerning for slightly worsened pulmonary edema. Underlying pneumonia is a concern. Electronically Signed   By: Garald Balding M.D.   On: 08/31/2017 02:22   Dg Chest Port 1 View  Result Date: 08/30/2017 CLINICAL DATA:  Endotracheal tube and orogastric tube placement. EXAM: PORTABLE CHEST 1 VIEW COMPARISON:  Chest radiograph performed earlier today at 4:44 p.m. FINDINGS: The patient's endotracheal tube is seen ending 9 cm above the carina. An enteric tube is noted extending below the diaphragm. Three right-sided chest tubes are noted. A residual small right-sided pleural effusion is seen. Increased interstitial markings raise concern for mild pulmonary edema, perhaps mildly improved from the prior study. No definite pneumothorax is seen. The cardiomediastinal silhouette is borderline normal in size. No acute osseous abnormalities are identified. Skin staples are noted along the right side of the chest. IMPRESSION: 1. Endotracheal tube seen ending 9 cm above the carina. This could be advanced 5 cm. 2. Three right-sided chest tubes noted. Residual  small right-sided pleural effusion seen. Increased interstitial markings raise concern for mild pulmonary edema, perhaps mildly improved from the prior study. 3. No definite pneumothorax seen. Electronically Signed   By: Garald Balding M.D.   On: 08/30/2017 23:10   Dg Chest Port 1 View  Result Date: 08/30/2017 CLINICAL DATA:  Empyema, post VATS procedure EXAM: PORTABLE CHEST 1 VIEW COMPARISON:  CT chest of 08/29/2017 and chest x-ray of 04/04/2017 FINDINGS: The right chest tube is now present scratch right chest tubes are now present. There is still poor aeration of the right lung with pleural opacity possibly representing residual pleural effusion-empyema. The left lung is clear.  The tip of the endotracheal tube is approximately 3.4 cm above the carina. Mild cardiomegaly is stable. IMPRESSION: 1. Right chest tubes present with poor aeration of the right lung. 2. Right pleural opacity remains consistent with volume loss, effusion and possibly empyema. 3. Endotracheal tube tip 3.4 cm above the carina. Electronically Signed   By: Ivar Drape M.D.   On: 08/30/2017 16:56   [place] Ekg Site Rite  Result Date: 09/03/2017 If Site Rite image not attached, placement could not be confirmed due to current cardiac rhythm.   (Echo, Carotid, EGD, Colonoscopy, ERCP)    Subjective:   Discharge Exam: Vitals:   09/05/17 0450 09/05/17 0810  BP: 108/64   Pulse: 90   Resp: 20   Temp: 99 F (37.2 C)   SpO2: 94% 96%   Vitals:   09/04/17 2145 09/05/17 0300 09/05/17 0450 09/05/17 0810  BP:  108/64 108/64   Pulse:  96 90   Resp:   20   Temp:  99 F (37.2 C) 99 F (37.2 C)   TempSrc:  Oral Oral   SpO2: 95%  94% 96%  Weight:      Height:        General: Pt is alert, awake, not in acute distress Cardiovascular: RRR, S1/S2 +, no rubs, no gallops Respiratory: CTA bilaterally, no wheezing, no rhonchi Abdominal: Soft, NT, ND, bowel sounds + Extremities: no edema, no cyanosis    The results of significant  diagnostics from this hospitalization (including imaging, microbiology, ancillary and laboratory) are listed below for reference.     Microbiology: Recent Results (from the past 240 hour(s))  Culture, blood (Routine X 2) w Reflex to ID Panel     Status: None   Collection Time: 08/29/17  3:05 PM  Result Value Ref Range Status   Specimen Description RIGHT ANTECUBITAL  Final   Special Requests   Final    BOTTLES DRAWN AEROBIC AND ANAEROBIC Blood Culture adequate volume   Culture   Final    NO GROWTH 5 DAYS Performed at [facility], [address], [place], [place] [address]    Report Status 09/03/2017 FINAL  Final  Culture, blood (Routine X 2) w Reflex to ID Panel     Status: None   Collection Time: 08/29/17  3:43 PM  Result Value Ref Range Status   Specimen Description LEFT ANTECUBITAL  Final   Special Requests   Final    BOTTLES DRAWN AEROBIC AND ANAEROBIC Blood Culture adequate volume   Culture   Final    NO GROWTH 5 DAYS Performed at [facility], [address], [place], [place] [address]    Report Status 09/03/2017 FINAL  Final  Surgical pcr screen     Status: None   Collection Time: 08/30/17 10:51 AM  Result Value Ref Range Status   MRSA, PCR NEGATIVE NEGATIVE Final   Staphylococcus aureus NEGATIVE NEGATIVE Final    Comment: (NOTE) The Xpert SA Assay (FDA approved for NASAL specimens in patients 28 years of age and older), is one component of a comprehensive surveillance program. It is not intended to diagnose infection nor to guide or monitor treatment. Performed at [facility], [place] [address], [place], [place] [address]   Fungus Culture With Stain     Status: None (Preliminary result)   Collection Time: 08/30/17  2:44 PM  Result Value Ref Range Status   Fungus Stain Final report  Final    Comment: (NOTE) Performed At: [facility] [address]  [place], [place] [id number] Rush Farmer MD [id number]    Fungus (Mycology) Culture PENDING   Incomplete   Fungal Source EMPYEMA  Final    Comment: RIGHT Performed at [facility], [place] [address], [place], [place] [address]   Acid Fast Smear (AFB)     Status: None   Collection Time: 08/30/17  2:44 PM  Result Value Ref Range Status   AFB Specimen Processing Concentration  Final   Acid Fast Smear Negative  Final    Comment: (NOTE) Performed At: [facility] [place], [place] [id number] Rush Farmer MD [id number]    Source (AFB) EMPYEMA  Final    Comment: RIGHT Performed at [facility], [place] [address] [place], [place] [address]   Aerobic/Anaerobic Culture (surgical/deep wound)     Status: None   Collection Time: 08/30/17  2:44 PM  Result Value Ref Range Status   Specimen Description EMPYEMA RIGHT  Final   Special Requests PATIENT ON FOLLOWING VANC  Final   Gram Stain   Final    MODERATE WBC PRESENT, PREDOMINANTLY PMN NO ORGANISMS SEEN    Culture   Final    ABUNDANT VIRIDANS STREPTOCOCCUS NO ANAEROBES ISOLATED Performed at [facility], [place] [address], [place], [place] [address]    Report Status 09/04/2017 FINAL  Final   Organism ID, Bacteria VIRIDANS STREPTOCOCCUS  Final      Susceptibility   Viridans streptococcus - MIC*    PENICILLIN <=0.06 SENSITIVE Sensitive     CEFTRIAXONE 0.25 SENSITIVE Sensitive     ERYTHROMYCIN 4 RESISTANT Resistant     LEVOFLOXACIN 0.5 SENSITIVE Sensitive     VANCOMYCIN 0.5 SENSITIVE Sensitive     * ABUNDANT VIRIDANS STREPTOCOCCUS  Fungus Culture Result     Status: None   Collection Time: 08/30/17  2:44 PM  Result Value Ref Range Status   Result 1 Comment  Final    Comment: (NOTE) KOH/Calcofluor preparation:  no fungus observed. Performed At: [facility] [place], [place] [id number] Rush Farmer MD [id number] Performed at [facility], [place] [address] [place], [place] [address]   Fungus Culture With Stain     Status: None (Preliminary result)    Collection Time: 08/30/17  2:51 PM  Result Value Ref Range Status   Fungus Stain Final report  Final    Comment: (NOTE) Performed At: [facility] [place], [place] [id number] Rush Farmer MD [id number]    Fungus (Mycology) Culture PENDING  Incomplete   Fungal Source TISSUE  Final    Comment: RIGHT LUNG Performed at [facility], [place] [address], [place], [place] [address]   Acid Fast Smear (AFB)     Status: None   Collection Time: 08/30/17  2:51 PM  Result Value Ref Range Status   AFB Specimen Processing Comment  Final    Comment: Tissue Grinding and Digestion/Decontamination   Acid Fast Smear Negative  Final    Comment: (NOTE) Performed At: [facility] [place], [place] [id number] Rush Farmer MD [id number]    Source (AFB) TISSUE  Final    Comment: RIGHT LUNG Performed at [facility], [place] [address], [place], [place] [address]   Aerobic/Anaerobic Culture (surgical/deep wound)     Status: None   Collection Time: 08/30/17  2:51 PM  Result Value Ref Range Status   Specimen Description TISSUE RIGHT LUNG  Final   Special Requests PATIENT ON  FOLLOWING VANC  Final   Gram Stain   Final    MODERATE WBC PRESENT, PREDOMINANTLY PMN FEW GRAM POSITIVE COCCI    Culture   Final    FEW VIRIDANS STREPTOCOCCUS NO ANAEROBES ISOLATED Performed at [facility], [place] [address], [place], [place] [address]    Report Status 09/04/2017 FINAL  Final   Organism ID, Bacteria VIRIDANS STREPTOCOCCUS  Final      Susceptibility   Viridans streptococcus - MIC*    PENICILLIN <=0.06 SENSITIVE Sensitive     CEFTRIAXONE <=0.12 SENSITIVE Sensitive     ERYTHROMYCIN 2 RESISTANT Resistant     LEVOFLOXACIN 0.5 SENSITIVE Sensitive     VANCOMYCIN 0.5 SENSITIVE Sensitive     * FEW VIRIDANS STREPTOCOCCUS  Fungus Culture Result     Status: None   Collection Time: 08/30/17  2:51 PM  Result Value Ref Range Status   Result 1 Comment   Final    Comment: (NOTE) KOH/Calcofluor preparation:  no fungus observed. Performed At: [facility] [place], [place] [id number] Rush Farmer MD [id number] Performed at [facility], [place] [address], [place], [place] [address]      Labs: BNP (last 3 results) Recent Labs    08/29/17 1505  BNP 784.6*   Basic Metabolic Panel: Recent Labs  Lab 08/31/17 0321 08/31/17 1525 09/01/17 0730 09/02/17 0311 09/03/17 0226 09/05/17 0458  NA 143  --  141 139 137 136  K 5.3*  --  4.4 3.8 4.0 4.7  CL 112*  --  109 111 103 104  CO2 23  --  25 24 29 28   GLUCOSE 188*  --  115* 120* 95 111*  BUN 16  --  16 17 12 9   CREATININE 0.86  --  0.81 0.83 0.85 0.83  CALCIUM 7.9*  --  8.0* 7.5* 7.7* 8.0*  MG  --  2.3  --  1.7 1.7  --    Liver Function Tests: Recent Labs  Lab 08/29/17 1505 09/01/17 0730  AST 17 75*  ALT 22 60  ALKPHOS 82 78  BILITOT 0.8 0.7  PROT 6.7 5.6*  ALBUMIN 2.7* 1.9*   No results for input(s): LIPASE, AMYLASE in the last 168 hours. No results for input(s): AMMONIA in the last 168 hours. CBC: Recent Labs  Lab 08/29/17 1505  08/31/17 0321 09/01/17 0301 09/02/17 0311 09/03/17 0226 09/05/17 0458  WBC 21.9*  --  17.5* 23.0* 14.1* 14.8* 19.8*  NEUTROABS 17.9*  --   --   --   --   --  13.9*  HGB 14.0   < > 13.1 15.1 12.6* 12.7* 12.1*  HCT 39.8   < > 39.1 46.1 37.5* 38.5* 35.7*  MCV 85.4  --  87.3 89.2 87.4 86.9 87.3  PLT 224  --  227 266 262 249 226   < > = values in this interval not displayed.   Cardiac Enzymes: Recent Labs  Lab 08/29/17 1505  TROPONINI 0.05*   BNP: Invalid input(s): POCBNP CBG: Recent Labs  Lab 09/01/17 2026 09/01/17 2347 09/02/17 0349 09/02/17 1118 09/02/17 1530  GLUCAP 73 94 107* 89 130*   D-Dimer No results for input(s): DDIMER in the last 72 hours. Hgb A1c No results for input(s): HGBA1C in the last 72 hours. Lipid Profile No results for input(s): CHOL, HDL, LDLCALC, TRIG, CHOLHDL,  LDLDIRECT in the last 72 hours. Thyroid function studies No results for input(s): TSH, T4TOTAL, T3FREE, THYROIDAB in the last 72 hours.  Invalid input(s): FREET3 Anemia work up No results for input(s): VITAMINB12, FOLATE, FERRITIN, TIBC, IRON, RETICCTPCT in the last 72 hours. Urinalysis    Component Value Date/Time   COLORURINE YELLOW 04/04/2017 0832   APPEARANCEUR CLEAR 04/04/2017 0832   LABSPEC 1.017 04/04/2017 0832   PHURINE 6.0 04/04/2017 0832   GLUCOSEU NEGATIVE 04/04/2017 0832   HGBUR SMALL (A) 04/04/2017 0832   BILIRUBINUR NEGATIVE 04/04/2017 0832   KETONESUR NEGATIVE 04/04/2017 0832   PROTEINUR 100 (A) 04/04/2017 0832   UROBILINOGEN 0.2 10/17/2014 0350   NITRITE NEGATIVE 04/04/2017 0832   LEUKOCYTESUR NEGATIVE 04/04/2017 0832   Sepsis Labs Invalid input(s): PROCALCITONIN,  WBC,  LACTICIDVEN Microbiology Recent Results (from the past 240 hour(s))  Culture, blood (Routine X 2) w Reflex to ID Panel     Status: None   Collection Time: 08/29/17  3:05 PM  Result Value Ref Range Status   Specimen Description RIGHT ANTECUBITAL  Final   Special Requests   Final    BOTTLES DRAWN AEROBIC AND ANAEROBIC Blood Culture adequate volume   Culture   Final    NO GROWTH 5 DAYS Performed at [facility], [address] [place], [place] [address]    Report Status 09/03/2017 FINAL  Final  Culture, blood (Routine X 2) w Reflex to ID Panel     Status: None   Collection Time: 08/29/17  3:43 PM  Result Value Ref Range Status   Specimen Description LEFT ANTECUBITAL  Final   Special Requests   Final    BOTTLES DRAWN AEROBIC AND ANAEROBIC Blood Culture adequate volume   Culture   Final    NO GROWTH 5 DAYS Performed at [facility], [address] [place], [place] [address]    Report Status 09/03/2017 FINAL  Final  Surgical pcr screen     Status: None   Collection Time: 08/30/17 10:51 AM  Result Value Ref Range Status   MRSA, PCR NEGATIVE NEGATIVE Final   Staphylococcus aureus NEGATIVE  NEGATIVE Final    Comment: (NOTE) The Xpert SA Assay (FDA approved for NASAL specimens in patients 68 years of age and older), is one component of a comprehensive surveillance program. It is not intended to diagnose infection nor to guide or monitor treatment. Performed at [facility], [place] [address] [place], [place] [address]   Fungus Culture With Stain     Status: None (Preliminary result)   Collection Time: 08/30/17  2:44 PM  Result Value Ref Range Status   Fungus Stain Final report  Final    Comment: (NOTE) Performed At: [facility] [place], [place] [id number] Rush Farmer MD [id number]    Fungus (Mycology) Culture PENDING  Incomplete   Fungal Source EMPYEMA  Final    Comment: RIGHT Performed at [facility], [place] [address], [place], [place] [address]   Acid Fast Smear (AFB)     Status: None   Collection Time: 08/30/17  2:44 PM  Result Value Ref Range Status   AFB Specimen Processing Concentration  Final   Acid Fast Smear Negative  Final    Comment: (NOTE) Performed At: [facility] [place], [place] [id number] Rush Farmer MD [id number]    Source (AFB) EMPYEMA  Final    Comment: RIGHT Performed at [facility], [place] [address] [place],  [address]   Aerobic/Anaerobic Culture (surgical/deep wound)     Status: None   Collection Time: 08/30/17  2:44 PM  Result Value Ref Range  Status   Specimen Description EMPYEMA RIGHT  Final   Special Requests PATIENT ON FOLLOWING VANC  Final   Gram Stain   Final    MODERATE WBC PRESENT, PREDOMINANTLY PMN NO ORGANISMS SEEN    Culture   Final    ABUNDANT VIRIDANS STREPTOCOCCUS NO ANAEROBES ISOLATED Performed at [facility], [address] [place], [place] [address]    Report Status 09/04/2017 FINAL  Final   Organism ID, Bacteria VIRIDANS STREPTOCOCCUS  Final      Susceptibility   Viridans streptococcus - MIC*    PENICILLIN <=0.06  SENSITIVE Sensitive     CEFTRIAXONE 0.25 SENSITIVE Sensitive     ERYTHROMYCIN 4 RESISTANT Resistant     LEVOFLOXACIN 0.5 SENSITIVE Sensitive     VANCOMYCIN 0.5 SENSITIVE Sensitive     * ABUNDANT VIRIDANS STREPTOCOCCUS  Fungus Culture Result     Status: None   Collection Time: 08/30/17  2:44 PM  Result Value Ref Range Status   Result 1 Comment  Final    Comment: (NOTE) KOH/Calcofluor preparation:  no fungus observed. Performed At: [facility] [place], [place] [id number] Rush Farmer MD [id number] Performed at [facility], [place] [address], [place], [place] [address]   Fungus Culture With Stain     Status: None (Preliminary result)   Collection Time: 08/30/17  2:51 PM  Result Value Ref Range Status   Fungus Stain Final report  Final    Comment: (NOTE) Performed At: [facility] [place], [place] [id number] Rush Farmer MD [id number]    Fungus (Mycology) Culture PENDING  Incomplete   Fungal Source TISSUE  Final    Comment: RIGHT LUNG Performed at [facility], [place] [address], [place], [place] [address]   Acid Fast Smear (AFB)     Status: None   Collection Time: 08/30/17  2:51 PM  Result Value Ref Range Status   AFB Specimen Processing Comment  Final    Comment: Tissue Grinding and Digestion/Decontamination   Acid Fast Smear Negative  Final    Comment: (NOTE) Performed At: [facility] [place], [place] [id number] Rush Farmer MD [id number]    Source (AFB) TISSUE  Final    Comment: RIGHT LUNG Performed at [facility], [place] [address] [place], [place] [address]   Aerobic/Anaerobic Culture (surgical/deep wound)     Status: None   Collection Time: 08/30/17  2:51 PM  Result Value Ref Range Status   Specimen Description TISSUE RIGHT LUNG  Final   Special Requests PATIENT ON FOLLOWING VANC  Final   Gram Stain   Final    MODERATE WBC PRESENT, PREDOMINANTLY PMN FEW GRAM  POSITIVE COCCI    Culture   Final    FEW VIRIDANS STREPTOCOCCUS NO ANAEROBES ISOLATED Performed at [facility], [place] [address], [place], [place] [address]    Report Status 09/04/2017 FINAL  Final   Organism ID, Bacteria VIRIDANS STREPTOCOCCUS  Final      Susceptibility   Viridans streptococcus - MIC*    PENICILLIN <=0.06 SENSITIVE Sensitive     CEFTRIAXONE <=0.12 SENSITIVE Sensitive     ERYTHROMYCIN 2 RESISTANT Resistant     LEVOFLOXACIN 0.5 SENSITIVE Sensitive     VANCOMYCIN 0.5 SENSITIVE Sensitive     * FEW VIRIDANS STREPTOCOCCUS  Fungus Culture Result     Status: None   Collection Time: 08/30/17  2:51 PM  Result Value Ref Range Status   Result  1 Comment  Final    Comment: (NOTE) KOH/Calcofluor preparation:  no fungus observed. Performed At: [facility] [place], [place] [id number] Rush Farmer MD [id number] Performed at [facility], [place] [address], [place], [place] [address]      Time coordinating discharge:36 minutes  SIGNED:   Georgette Shell, MD  [facility] 09/05/2017, 10:47 AM Pager   If 7PM-7AM, please contact night-coverage [email or web address] Password [email or web address]

## 2017-09-05 NOTE — Progress Notes (Signed)
Patient discharge information reviewed with he and his wife. All prescriptions given and questions answered. R arm PICC infusing with 24 hour antibiotics that were set up by IV nurse earlier in room and patient and wife have basic troubleshooting and maintenance training on machine. They deny questions and he was transported via wheelchair with all belongings to family vehicle in stable condition.

## 2017-09-05 NOTE — Progress Notes (Addendum)
      [address]       [place] [id number]             [phone]      6 Days Post-Op Procedure(s) (LRB): VIDEO ASSISTED THORACOSCOPY (VATS)/EMPYEMA (Right)   Subjective:  No new complaints.  Ready to go home.  All questions addressed and answered.  Objective: Vital signs in last 24 hours: Temp:  [99 F (37.2 C)-100 F (37.8 C)] 99 F (37.2 C) (06/03 0450) Pulse Rate:  [89-96] 90 (06/03 0450) Cardiac Rhythm: Sinus tachycardia (06/03 0700) Resp:  [18-24] 20 (06/03 0450) BP: (108-154)/(57-88) 108/64 (06/03 0450) SpO2:  [92 %-96 %] 94 % (06/03 0450)  Intake/Output from previous day: 06/02 0701 - 06/03 0700 In: 930 [P.O.:420; I.V.:10; IV Piggyback:500] Out: -   General appearance: alert, cooperative and no distress Heart: regular rate and rhythm Lungs: diminished breath sounds bibasilar Abdomen: soft, non-tender; bowel sounds normal; no masses,  no organomegaly Extremities: edema 1+ Wound: clean and dry, staples remain in place  Lab Results: Recent Labs    09/03/17 0226 09/05/17 0458  WBC 14.8* 19.8*  HGB 12.7* 12.1*  HCT 38.5* 35.7*  PLT 249 226   BMET:  Recent Labs    09/03/17 0226 09/05/17 0458  NA 137 136  K 4.0 4.7  CL 103 104  CO2 29 28  GLUCOSE 95 111*  BUN 12 9  CREATININE 0.85 0.83  CALCIUM 7.7* 8.0*    PT/INR: No results for input(s): LABPROT, INR in the last 72 hours. ABG    Component Value Date/Time   PHART 7.365 08/31/2017 0927   HCO3 15.8 (L) 08/31/2017 1054   TCO2 17 (L) 08/31/2017 1054   ACIDBASEDEF 9.0 (H) 08/31/2017 1054   O2SAT 95.0 08/31/2017 1054   CBG (last 3)  Recent Labs    09/02/17 1118 09/02/17 1530  GLUCAP 89 130*    Assessment/Plan: S/P Procedure(s) (LRB): VIDEO ASSISTED THORACOSCOPY (VATS)/EMPYEMA (Right)  1. CV- hemodynamically stable 2. ID- remains afebrile, to continue IV ABX per ID recommendations then will transition to oral ABX after completion 3. Dispo- patient stable, will d/c  home today with H/H   LOS: 7 days    Clayton Jensen 09/05/2017   Chart reviewed, patient examined, agree with above. He feels well. Tmax 99 WBC up slightly to 19.8. CXR looks stable with right pleural thickening and resolving pneumonia in RLL Incision ok with minimal erythema around the staples and chest tube sutures. He needs to continue antibiotics IV and work on IS, ambulation Plan to send home today on home IV antibiotics per ID rec. He will return to office to get his chest tube sutures and staples out. He has a follow up appt in ID clinic in one week.

## 2017-09-06 ENCOUNTER — Other Ambulatory Visit: Payer: Self-pay

## 2017-09-06 DIAGNOSIS — Z48813 Encounter for surgical aftercare following surgery on the respiratory system: Secondary | ICD-10-CM

## 2017-09-06 MED ORDER — FLUCONAZOLE 100 MG PO TABS: 100.0000 mg | ORAL_TABLET | Freq: Every day | ORAL | 0 refills | Status: AC

## 2017-09-07 ENCOUNTER — Telehealth: Payer: Self-pay | Admitting: Family Medicine

## 2017-09-07 ENCOUNTER — Telehealth: Payer: Self-pay

## 2017-09-07 NOTE — Telephone Encounter (Signed)
[facility] for loculated pleural effusion.  He wants to know if Dr. Brett CanalesSteve needs him to follow up with him?

## 2017-09-07 NOTE — Telephone Encounter (Signed)
Elnita MaxwellCheryl with [facility] called 09/06/2017 to get verbal orders for Home Health after [name]'s recent discharge from the hospital of Pleural Effusion, and empyema drainage.  She stated that he also had thrush related to antibiotic usage.  Dr. Laneta SimmersBartle made aware of s/s and a call was made to pharmacy regarding prescriptions.  Also, verbal orders will be given to Opdykeheryl as well.

## 2017-09-08 ENCOUNTER — Telehealth: Payer: Self-pay | Admitting: Family Medicine

## 2017-09-08 ENCOUNTER — Telehealth: Payer: Self-pay

## 2017-09-08 NOTE — Telephone Encounter (Signed)
Patient stated he was not sure why the insurance sent us a form and is going to contact the insurance company and try to figure out what they are wanting and why.

## 2017-09-08 NOTE — Telephone Encounter (Signed)
I called and left a message to r/c. 

## 2017-09-08 NOTE — Telephone Encounter (Signed)
[name] called this morning after coughing vigorously to clear his throat and noticed drainage from his chest tube incision sites.  He is s/p VATS/ Empyema 08/30/2017.   He stated that it was a small amount that looked clear/ pink.  He denied any smell, temperature, or redness to the incision sites. I advised that this is not uncommon after coughing and to place a dry, clean bandage over the incision site.  I did advise him that if he needed to change the dressing often, or if anything to the incision sites changes to give the office a call back.  He acknowledged receipt.

## 2017-09-08 NOTE — Telephone Encounter (Signed)
It is prtetty much routine for hospitalist s to rec following up with family doc on all admissions now, in his case, his problems are specialized in nature and the thoracic surgeon and the infectious disease doctor are fgoing to have to manage his problem that put him in the hospital. I will be happy to see if pt likers, but not mandatory in my view

## 2017-09-08 NOTE — Telephone Encounter (Signed)
He will follow up with those Physicians and will follow up here after that.

## 2017-09-08 NOTE — Telephone Encounter (Signed)
[employer] faxed over a form.I review it and its need to be done by physician .I have attached the medication list.Form is in your yellow folder.Is there a charge.

## 2017-09-08 NOTE — Telephone Encounter (Signed)
See last phone message pt did not tell me his insur co was going to send a for m for a "treatment plan" for me to fill out, therefore rec o v since they are doing this, will do then

## 2017-09-13 ENCOUNTER — Other Ambulatory Visit: Payer: Self-pay

## 2017-09-13 ENCOUNTER — Ambulatory Visit (INDEPENDENT_AMBULATORY_CARE_PROVIDER_SITE_OTHER): Payer: Self-pay

## 2017-09-13 DIAGNOSIS — Z4802 Encounter for removal of sutures: Secondary | ICD-10-CM

## 2017-09-13 DIAGNOSIS — G8918 Other acute postprocedural pain: Secondary | ICD-10-CM

## 2017-09-13 MED ORDER — TRAMADOL HCL 50 MG PO TABS: 50.0000 mg | ORAL_TABLET | Freq: Four times a day (QID) | ORAL | 0 refills | Status: DC | PRN

## 2017-09-13 NOTE — Progress Notes (Signed)
Patient arrived for nurse visit to remove 3 sutures and 14 staples post- procedure VATS/ empyema 08/30/2017.  Sutures/ staples removed with no signs/ symptoms of infection noted.  Patient tolerated procedure well.  3rd distal chest tube site was open when cleaned with peroxide and a q-tip, the q-tip was advanced into the incision about 0.5 inch deep but no further.  The thoracotomy incision site staples were removed.  The incision was non- approximated but closed without any drainage.  I advised the patient to keep watch on the incisions and give the office a call back if any changes occurred. Patient/ family instructed to keep the incision sites clean and dry.  Another prescription was given to the patient and signed by Jerline Painonielle, PA.  Patient instructed to take the prescribed medication on as needed and to take sparingly.  Patient/ family acknowledged instructions given.

## 2017-09-14 ENCOUNTER — Ambulatory Visit (INDEPENDENT_AMBULATORY_CARE_PROVIDER_SITE_OTHER): Payer: 59 | Admitting: Family

## 2017-09-14 ENCOUNTER — Encounter: Payer: Self-pay | Admitting: Family

## 2017-09-14 VITALS — BP 131/76 | HR 82 | Temp 98.7°F | Ht 72.0 in | Wt 350.0 lb

## 2017-09-14 DIAGNOSIS — J869 Pyothorax without fistula: Secondary | ICD-10-CM | POA: Diagnosis not present

## 2017-09-14 NOTE — Patient Instructions (Signed)
Nice to meet you.   We will check your blood work today.  Please complete the Amoxicillin as prescribed.  Continue wound care with soap and water.   Return to work per Dr. Laneta SimmersBartle.   Continue to work on finding a Education officer, communitydentist to evaluate for abscess.

## 2017-09-14 NOTE — Assessment & Plan Note (Signed)
[name] completed his IV antibiotics and was transitioned to amoxicillin with 8 days remaining of the antimicrobial regimen. He has been afebrile. Continues to heal well with one site from chest tube that is draining serosanguineous fluid. There is no evidence of infection. Encouraged to continue to seek care from dentistry as his infection may be related to poor dental hygiene. 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 Continue amoxicillin until completed. Continue follow-up and return to work per Dr. Laneta SimmersBartle. Follow up with ID as needed or if symptoms return.

## 2017-09-14 NOTE — Progress Notes (Signed)
Subjective:    Patient ID: Clayton Jensen, male    DOB: 1980/07/15, 37 y.o.   MRN: [id number]  Chief Complaint  Patient presents with  . Hospitalization Follow-up    HPI:  Clayton Jensen is a 37 y.o. male who presents today for initial office visit following hospitalization.   Clayton Jensen was recently evaluated in the emergency department and admitted to the hospital with chief complaint of worsening shortness of breath, cough, and chest pain starting approximately 4 days prior to presentation following picking up his child and twisting. He was noted to have low-grade fevers at home. Initially seen in urgent care with chest x-rays with no significant findings he was started on prednisone and nonsteroidal anti-inflammatories with the diagnosis of costochondritis. He experience worsening of the shortness of breath and a CT scan found a large partially loculated right pleural effusion. He underwent a thoracotomy for drainage which recovered approximately 1800 mL of white, purulent fluid. Cultures obtained during the procedure were positive for viridans streptococcus. He was placed on ampillicin via PICC line with goal treatment of 14 days then to be transitioned to oral Amoxicillin for 10 days. Advised at the time to see a dentist to rule out possible abscess as a cause of his symptoms. All hospital labs, records, and imaging were reviewed in detail.  Since leaving the hospital he reports that he has been feeling better. Did have episode of warmth this morning and it went away. He reports he lost weight. May have had a waxing and weaning temperature. He has completed the course of IV antibiotics with no complications and has started taking the amoxicillin 2 days ago and has 8 days remaining. No shortness of breath.  Wt Readings from Last 3 Encounters:  09/14/17 (!) 350 lb (158.8 kg)  09/04/17 (!) 387 lb 5.6 oz (175.7 kg)  04/18/17 (!) 359 lb (162.8 kg)         Allergies  Allergen Reactions    . Celery Oil Shortness Of Breath and Swelling    Swelling of throat      Outpatient Medications Prior to Visit  Medication Sig Dispense Refill  . acetaminophen (TYLENOL) 500 MG tablet Take 500 mg by mouth every 6 (six) hours as needed for mild pain.    Marland Kitchen amoxicillin (AMOXIL) 500 MG tablet Take 1 tablet (500 mg total) by mouth 3 (three) times daily. X 10 days, start after completion of IV Antibiotics 30 tablet 0  . ampicillin IVPB Inject 12 g into the vein daily. Continuous ampicillin 12g IV infusion over 24 hrs  Indication: Strep. Viridans Empyema / PNA Last Day of Therapy: 09/11/17 Labs - Once weekly:  CBC/D and BMP, Labs - Every other week:  ESR and CRP 8 Units 0  . cetirizine (ZYRTEC) 10 MG tablet Take 10 mg by mouth once as needed for allergies.    . furosemide (LASIX) 40 MG tablet Take 1 tablet (40 mg total) by mouth daily. For 3 days 3 tablet 0  . methocarbamol (ROBAXIN) 500 MG tablet Take 500 mg by mouth every 6 (six) hours as needed for muscle spasms.    . potassium chloride SA (K-DUR,KLOR-CON) 20 MEQ tablet Take 1 tablet (20 mEq total) by mouth daily. For 3 days 3 tablet 0  . traMADol (ULTRAM) 50 MG tablet Take 1 tablet (50 mg total) by mouth every 6 (six) hours as needed. 28 tablet 0   No facility-administered medications prior to visit.      Past  Medical History:  Diagnosis Date  . Chronic lower back pain    "fell off roof in ~ 2003; disc fused itself"  . History of kidney stones   . OSA (obstructive sleep apnea)    clinical diagnosis, not officially diagnosed per family     Past Surgical History:  Procedure Laterality Date  . KNEE ARTHROSCOPY Left   . VIDEO ASSISTED THORACOSCOPY (VATS)/EMPYEMA Right 08/30/2017   Procedure: VIDEO ASSISTED THORACOSCOPY (VATS)/EMPYEMA;  Surgeon: Gaye Pollack, MD;  Location: [facility] OR;  Service: Thoracic;  Laterality: Right;      Family History  Problem Relation Age of Onset  . COPD Mother   . Atrial fibrillation Mother   .  Diabetes Mother   . Diabetes Father       Social History   Socioeconomic History  . Marital status: Married    Spouse name: Not on file  . Number of children: Not on file  . Years of education: Not on file  . Highest education level: Not on file  Occupational History  . Not on file  Social Needs  . Financial resource strain: Not on file  . Food insecurity:    Worry: Not on file    Inability: Not on file  . Transportation needs:    Medical: Not on file    Non-medical: Not on file  Tobacco Use  . Smoking status: Current Every Day Smoker    Packs/day: 0.75    Years: 23.00    Pack years: 17.25    Types: Cigarettes  . Smokeless tobacco: Never Used  Substance and Sexual Activity  . Alcohol use: Yes    Frequency: Never    Comment: 04/05/2017 "might have a beer q 2 months"  . Drug use: No  . Sexual activity: Yes  Lifestyle  . Physical activity:    Days per week: Not on file    Minutes per session: Not on file  . Stress: Not on file  Relationships  . Social connections:    Talks on phone: Not on file    Gets together: Not on file    Attends religious service: Not on file    Active member of club or organization: Not on file    Attends meetings of clubs or organizations: Not on file    Relationship status: Not on file  . Intimate partner violence:    Fear of current or ex partner: Not on file    Emotionally abused: Not on file    Physically abused: Not on file    Forced sexual activity: Not on file  Other Topics Concern  . Not on file  Social History Narrative  . Not on file    Review of Systems  Constitutional: Negative for activity change, chills, diaphoresis, fatigue, fever and unexpected weight change.  Respiratory: Negative for chest tightness, shortness of breath and wheezing.   Cardiovascular: Negative for chest pain, palpitations and leg swelling.  Neurological: Negative for dizziness and weakness.       Objective:    BP 131/76   Pulse 82   Temp  98.7 F (37.1 C) (Oral)   Ht 6' (1.829 m)   Wt (!) 350 lb (158.8 kg)   BMI 47.47 kg/m  Nursing note and vital signs reviewed.  Physical Exam  Constitutional: He is oriented to person, place, and time. He appears well-developed and well-nourished. No distress.  Cardiovascular: Normal rate, regular rhythm, normal heart sounds and intact distal pulses. Exam reveals no gallop and no  friction rub.  No murmur heard. Pulmonary/Chest: Effort normal and breath sounds normal. No stridor. No respiratory distress. He has no wheezes. He has no rales. He exhibits no tenderness.  Abdominal: Soft. Bowel sounds are normal. He exhibits no distension.  Neurological: He is alert and oriented to person, place, and time.  Skin: Skin is warm and dry.  Psychiatric: He has a normal mood and affect. His behavior is normal.        Assessment & Plan:   Problem List Items Addressed This Visit      Other   Empyema ([place]) - Primary    Mr. Dehart completed his IV antibiotics and was transitioned to amoxicillin with 8 days remaining of the antimicrobial regimen. He has been afebrile. Continues to heal well with one site from chest tube that is draining serosanguineous fluid. There is no evidence of infection. Encouraged to continue to seek care from dentistry as his infection may be related to poor dental hygiene. 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 Continue amoxicillin until completed. Continue follow-up and return to work per Dr. Cyndia Bent. Follow up with ID as needed or if symptoms return.       Relevant Orders   Complement, total   CBC w/Diff   Comprehensive metabolic panel       I am having Astrid Divine. Gottwald maintain his methocarbamol, acetaminophen, cetirizine, ampicillin, potassium chloride SA, furosemide, amoxicillin, and traMADol.   Follow-up: Return if symptoms worsen or fail to improve.  Mauricio Po,  [facility]

## 2017-09-15 ENCOUNTER — Inpatient Hospital Stay: Payer: Self-pay | Admitting: Internal Medicine

## 2017-09-16 LAB — CBC WITH DIFFERENTIAL/PLATELET
BASOS ABS: 82 {cells}/uL (ref 0–200)
Basophils Relative: 0.8 %
EOS ABS: 113 {cells}/uL (ref 15–500)
Eosinophils Relative: 1.1 %
HEMATOCRIT: 38 % — AB (ref 38.5–50.0)
HEMOGLOBIN: 13.6 g/dL (ref 13.2–17.1)
LYMPHS ABS: 3049 {cells}/uL (ref 850–3900)
MCH: 29.6 pg (ref 27.0–33.0)
MCHC: 35.8 g/dL (ref 32.0–36.0)
MCV: 82.8 fL (ref 80.0–100.0)
MONOS PCT: 6 %
MPV: 9.9 fL (ref 7.5–12.5)
NEUTROS ABS: 6438 {cells}/uL (ref 1500–7800)
Neutrophils Relative %: 62.5 %
Platelets: 301 10*3/uL (ref 140–400)
RBC: 4.59 10*6/uL (ref 4.20–5.80)
RDW: 12.4 % (ref 11.0–15.0)
Total Lymphocyte: 29.6 %
WBC: 10.3 10*3/uL (ref 3.8–10.8)
WBCMIX: 618 {cells}/uL (ref 200–950)

## 2017-09-16 LAB — COMPREHENSIVE METABOLIC PANEL
AG RATIO: 0.7 (calc) — AB (ref 1.0–2.5)
ALT: 36 U/L (ref 9–46)
AST: 42 U/L — AB (ref 10–40)
Albumin: 3.1 g/dL — ABNORMAL LOW (ref 3.6–5.1)
Alkaline phosphatase (APISO): 110 U/L (ref 40–115)
BILIRUBIN TOTAL: 0.4 mg/dL (ref 0.2–1.2)
BUN: 13 mg/dL (ref 7–25)
CALCIUM: 9 mg/dL (ref 8.6–10.3)
CO2: 28 mmol/L (ref 20–32)
Chloride: 99 mmol/L (ref 98–110)
Creat: 0.86 mg/dL (ref 0.60–1.35)
GLUCOSE: 122 mg/dL — AB (ref 65–99)
Globulin: 4.7 g/dL (calc) — ABNORMAL HIGH (ref 1.9–3.7)
Potassium: 4.6 mmol/L (ref 3.5–5.3)
SODIUM: 138 mmol/L (ref 135–146)
Total Protein: 7.8 g/dL (ref 6.1–8.1)

## 2017-09-16 LAB — COMPLEMENT, TOTAL: Compl, Total (CH50): >60 U/mL — ABNORMAL HIGH (ref 31–60)

## 2017-09-20 ENCOUNTER — Telehealth: Payer: Self-pay | Admitting: Thoracic Surgery (Cardiothoracic Vascular Surgery)

## 2017-09-20 NOTE — Telephone Encounter (Signed)
Clayton Jensen called to report an area of separation of the incision approximately 1/2 inch in length. Noted after he got out of the shower.  Instructed to cover with dry gauze and come to the office tomorrow for a wound check.  [name], MD [facility] [phone]

## 2017-09-21 ENCOUNTER — Ambulatory Visit (INDEPENDENT_AMBULATORY_CARE_PROVIDER_SITE_OTHER): Payer: Self-pay | Admitting: *Deleted

## 2017-09-21 ENCOUNTER — Telehealth: Payer: Self-pay

## 2017-09-21 DIAGNOSIS — Z09 Encounter for follow-up examination after completed treatment for conditions other than malignant neoplasm: Secondary | ICD-10-CM

## 2017-09-21 DIAGNOSIS — Z4802 Encounter for removal of sutures: Secondary | ICD-10-CM

## 2017-09-21 DIAGNOSIS — J869 Pyothorax without fistula: Secondary | ICD-10-CM

## 2017-09-21 NOTE — Progress Notes (Signed)
Clayton Jensen had called with concerns of an area on his right thoracic incision that had opened. On exam, there is a very small hole at the very end of the incision. I was barely able to probe it with the end of a cotton applicator. There is no drainage, redness to the site. I felt just keeping it cleansed with soap and water, drying completely, then applying a small dressing would be acceptable for healing.  He was instructed to watch for redness, foul drainge and or fever. He will finish his oral antibiotic tomorrow.

## 2017-09-21 NOTE — Telephone Encounter (Signed)
Patient called regarding wound dehiscence.  Per Dr. Sunday CornHendrickson's note, patient was scheduled for wound check.  Patient is coming into the office today to have wound evaluated.

## 2017-09-22 ENCOUNTER — Telehealth: Payer: Self-pay | Admitting: Behavioral Health

## 2017-09-22 NOTE — Telephone Encounter (Signed)
Called patient per Marcos EkeGreg Calone NP and informed him that his blood work shows that his kidney function, liver function, electrolytes, and immune system show no abnormalities.  Also let him know that Tammy SoursGreg recommends following up with Dentistry and recommends  vaccinations are up to date. He can follow up as needed.   Patient verbalized understanding  Clayton SlimAshley Hill RN

## 2017-09-22 NOTE — Telephone Encounter (Signed)
-----   Message from Veryl SpeakGregory D Calone, FNP sent at 09/19/2017  9:04 AM EDT ----- Please inform Clayton Jensen that his blood work shows that his kidney function, liver function, electrolytes, and his immune system look okay with no abnormalities. Recommend following up with dentistry and ensure that all his recommended vaccinations are up to date. Follow up as needed.

## 2017-09-23 ENCOUNTER — Other Ambulatory Visit: Payer: Self-pay | Admitting: Surgery

## 2017-09-23 DIAGNOSIS — J869 Pyothorax without fistula: Secondary | ICD-10-CM

## 2017-09-26 ENCOUNTER — Ambulatory Visit: Payer: Self-pay

## 2017-09-27 ENCOUNTER — Other Ambulatory Visit: Payer: Self-pay

## 2017-09-27 ENCOUNTER — Ambulatory Visit
Admission: RE | Admit: 2017-09-27 | Discharge: 2017-09-27 | Disposition: A | Discharge status: Home / Self Care | Payer: 59 | Source: Ambulatory Visit | Attending: Surgery | Admitting: Surgery

## 2017-09-27 ENCOUNTER — Ambulatory Visit (INDEPENDENT_AMBULATORY_CARE_PROVIDER_SITE_OTHER): Payer: Self-pay | Admitting: Physician Assistant

## 2017-09-27 VITALS — BP 124/80 | HR 88 | Temp 98.1°F | Resp 16 | Ht 72.0 in | Wt 350.0 lb

## 2017-09-27 DIAGNOSIS — Z09 Encounter for follow-up examination after completed treatment for conditions other than malignant neoplasm: Secondary | ICD-10-CM

## 2017-09-27 DIAGNOSIS — J869 Pyothorax without fistula: Secondary | ICD-10-CM

## 2017-09-27 NOTE — Progress Notes (Signed)
HPI:  Patient returns for routine postoperative follow-up having undergone Right Thoracotomy with drainage of Empyema  on 08/30/2017.  The patient's early postoperative recovery while in the hospital was notable for growth of streptococcal.  He was treated with prolonged antibiotics at discharge.  Since hospital discharge the patient reports he is doing okay.  He continues to have pain along his right chest and under his right breast.  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  He does have a mild cold but denies fever at this time, chest pain, and shortness of breath.   Current Outpatient Medications  Medication Sig Dispense Refill  . acetaminophen (TYLENOL) 500 MG tablet Take 500 mg by mouth every 6 (six) hours as needed for mild pain.    . cetirizine (ZYRTEC) 10 MG tablet Take 10 mg by mouth once as needed for allergies.    . methocarbamol (ROBAXIN) 500 MG tablet Take 500 mg by mouth every 6 (six) hours as needed for muscle spasms.    . traMADol (ULTRAM) 50 MG tablet Take 1 tablet (50 mg total) by mouth every 6 (six) hours as needed. 28 tablet 0   No current facility-administered medications for this visit.     Physical Exam:  BP 124/80 (BP Location: Right Arm, Patient Position: Sitting, Cuff Size: Large)   Pulse 88   Temp 98.1 F (36.7 C) (Oral)   Resp 16   Ht 6' (1.829 m)   Wt (!) 350 lb (158.8 kg)   SpO2 96% Comment: ON RA  BMI 47.47 kg/m   Gen: no apparent distress Heart: RRR Lungs: diminished right base Incisions: well healed  Diagnostic Tests:  CXR:  Small right pleural effusion, continued pleural scarring  A/P:  1. S/P Empyema- completed therapy, stable appearance of CXR.. Improvement of right sided pleural effusion 2. URI- patient taking flonase, gave ok to start Pseudofed as he is not a cardiac patient 3. Activity- patient should refrain from strenuous activity for several more weeks.  If he  develops fever and his symptoms don't improve he should be evaluated by PCP 4. RTC in August to see Dr. Laneta SimmersBartle per patient requests   Lowella DandyErin Hollace Michelli, PA-C [facility] [phone]

## 2017-09-29 LAB — FUNGUS CULTURE RESULT

## 2017-09-29 LAB — FUNGAL ORGANISM REFLEX

## 2017-09-29 LAB — FUNGUS CULTURE WITH STAIN

## 2017-10-14 LAB — ACID FAST CULTURE WITH REFLEXED SENSITIVITIES (MYCOBACTERIA)
Acid Fast Culture: NEGATIVE
Acid Fast Culture: NEGATIVE

## 2017-10-14 LAB — ACID FAST CULTURE WITH REFLEXED SENSITIVITIES

## 2017-10-28 ENCOUNTER — Other Ambulatory Visit: Payer: Self-pay | Admitting: Surgery

## 2017-10-28 DIAGNOSIS — J869 Pyothorax without fistula: Secondary | ICD-10-CM

## 2017-11-01 ENCOUNTER — Ambulatory Visit (INDEPENDENT_AMBULATORY_CARE_PROVIDER_SITE_OTHER): Payer: Self-pay | Admitting: Physician Assistant

## 2017-11-01 ENCOUNTER — Other Ambulatory Visit: Payer: Self-pay

## 2017-11-01 ENCOUNTER — Ambulatory Visit
Admission: RE | Admit: 2017-11-01 | Discharge: 2017-11-01 | Disposition: A | Discharge status: Home / Self Care | Payer: 59 | Source: Ambulatory Visit | Attending: Surgery | Admitting: Surgery

## 2017-11-01 ENCOUNTER — Encounter: Payer: Self-pay | Admitting: Physician Assistant

## 2017-11-01 VITALS — BP 116/60 | HR 97 | Resp 20 | Ht 72.0 in | Wt 363.0 lb

## 2017-11-01 DIAGNOSIS — J869 Pyothorax without fistula: Secondary | ICD-10-CM

## 2017-11-01 DIAGNOSIS — Z09 Encounter for follow-up examination after completed treatment for conditions other than malignant neoplasm: Secondary | ICD-10-CM

## 2017-11-01 NOTE — Progress Notes (Signed)
HPI:  [name] is S/P Right Thoracotomy for drainage of empyema performed 08/30/2017.  He was last seen in the office on 09/27/2017 at which time he was doing very well.  He reports today for clearance to go to the beach.  He states overall he continues to do well. He does remain numb across his right chest.  He also notices sometimes in the extreme heat it is difficult to breathe.  He is otherwise back to her normal self.   Current Outpatient Medications  Medication Sig Dispense Refill  . acetaminophen (TYLENOL) 500 MG tablet Take 500 mg by mouth every 6 (six) hours as needed for mild pain.    . cetirizine (ZYRTEC) 10 MG tablet Take 10 mg by mouth once as needed for allergies.    . methocarbamol (ROBAXIN) 500 MG tablet Take 500 mg by mouth every 6 (six) hours as needed for muscle spasms.    . traMADol (ULTRAM) 50 MG tablet Take 1 tablet (50 mg total) by mouth every 6 (six) hours as needed. 28 tablet 0   No current facility-administered medications for this visit.     Physical Exam:  BP 116/60   Pulse 97   Resp 20   Ht 6' (1.829 m)   Wt (!) 363 lb (164.7 kg)   SpO2 96% Comment: RA  BMI 49.23 kg/m   Gen: no apparent distress Heart: RRR Lungs: mildly diminished right base Incisions: well healed  Diagnostic Tests:  CXR: improvement of atelectasis, continued small right sided pleural effusion/scarring  A/P:  1. S/p Right Thoracotomy with drainage of empyema doing very well.  His incisions are completely healed. 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  He is okay to go the beach and swim at this time.  He was educated to avoid excessive heat all day as he may notice it remains difficult to breath as he continues to heal from surgery 2. RTC PRN  Lowella DandyErin Jianna Drabik, PA-C [facility] [phone]

## 2017-11-09 ENCOUNTER — Encounter: Payer: Self-pay | Admitting: Surgery

## 2018-01-09 ENCOUNTER — Ambulatory Visit (INDEPENDENT_AMBULATORY_CARE_PROVIDER_SITE_OTHER): Payer: 59 | Admitting: Family Medicine

## 2018-01-09 VITALS — BP 118/78 | Ht 72.0 in | Wt 369.8 lb

## 2018-01-09 DIAGNOSIS — R0789 Other chest pain: Secondary | ICD-10-CM

## 2018-01-09 DIAGNOSIS — R41840 Attention and concentration deficit: Secondary | ICD-10-CM | POA: Diagnosis not present

## 2018-01-09 DIAGNOSIS — A86 Unspecified viral encephalitis: Secondary | ICD-10-CM | POA: Diagnosis not present

## 2018-01-09 DIAGNOSIS — R413 Other amnesia: Secondary | ICD-10-CM | POA: Diagnosis not present

## 2018-01-09 DIAGNOSIS — F419 Anxiety disorder, unspecified: Secondary | ICD-10-CM

## 2018-01-09 MED ORDER — ESCITALOPRAM OXALATE 10 MG PO TABS: 10.0000 mg | ORAL_TABLET | Freq: Every day | ORAL | 0 refills | Status: DC

## 2018-01-09 MED ORDER — ESCITALOPRAM OXALATE 20 MG PO TABS: 20.0000 mg | ORAL_TABLET | Freq: Every day | ORAL | 5 refills | Status: DC

## 2018-01-09 NOTE — Progress Notes (Signed)
   Subjective:    Patient ID: Clayton Jensen, male    DOB: Aug 06, 1980, 37 y.o.   MRN: [id number]  HPI  Patient arrives to discuss ongoing complications since encephalitis. Patient reports ongoing pain and memory issues and anxiety and short tempered since hospitalization and surgery.  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  Lungs clear.  Heart rate and rhythm.  Chest wall scar noted some sensitivity to touch.  Abdomen soft.  Patient oriented x3.       Assessment & Plan:  Impression post encephalitis.  Continued memory issues.  Continue focusing issues.  More irritability.  More anxiety.  Feeling down at times.  No suicidal thoughts.  Long discussion held.  Will initiate Lexapro, but also feel patient needs neurocognitive assessment.  Rationale discussed with we will work on this  2.  Status post thoracotomy neuropathic pain.  Symptom care discussed.  No prescription medications at this time  Follow-up in 2 months  Greater than 50% of this 25 minute face to face visit was spent in counseling and discussion and coordination of care regarding the above diagnosis/diagnosies

## 2018-01-16 ENCOUNTER — Encounter: Payer: Self-pay | Admitting: Family Medicine

## 2018-01-23 ENCOUNTER — Encounter: Payer: Self-pay | Admitting: Family Medicine

## 2018-03-14 ENCOUNTER — Ambulatory Visit (INDEPENDENT_AMBULATORY_CARE_PROVIDER_SITE_OTHER): Payer: 59 | Admitting: Family Medicine

## 2018-03-14 ENCOUNTER — Encounter: Payer: Self-pay | Admitting: Family Medicine

## 2018-03-14 ENCOUNTER — Ambulatory Visit: Payer: 59 | Admitting: Family Medicine

## 2018-03-14 VITALS — BP 122/74 | Temp 98.7°F | Ht 72.0 in | Wt 374.0 lb

## 2018-03-14 DIAGNOSIS — R05 Cough: Secondary | ICD-10-CM

## 2018-03-14 DIAGNOSIS — J019 Acute sinusitis, unspecified: Secondary | ICD-10-CM | POA: Diagnosis not present

## 2018-03-14 DIAGNOSIS — R059 Cough, unspecified: Secondary | ICD-10-CM

## 2018-03-14 MED ORDER — BENZONATATE 100 MG PO CAPS: 100.0000 mg | ORAL_CAPSULE | Freq: Three times a day (TID) | ORAL | 0 refills | Status: DC | PRN

## 2018-03-14 MED ORDER — AMOXICILLIN 500 MG PO CAPS: 500.0000 mg | ORAL_CAPSULE | Freq: Three times a day (TID) | ORAL | 0 refills | Status: AC

## 2018-03-14 NOTE — Progress Notes (Signed)
   Subjective:    Patient ID: Arlyce HarmanMichael H Chavana, male    DOB: 1980/04/07, 37 y.o.   MRN: [id number]  Cough  This is a new problem. Episode onset: one week ago. Pertinent negatives include no chills, ear pain, fever, sore throat, shortness of breath or wheezing. Associated symptoms comments: Congestion, ears stopped up, . Treatments tried: otc meds.   Reports 1 week hx of congestion, cough, and ears feeling stopped up. Cough productive of green phlegm, reports discomfort to right-sided chest with cough. Denies fever. Feels like he's starting to get a little better.   Has tried flonase, and helped some.    Review of Systems  Constitutional: Negative for chills and fever.  HENT: Positive for congestion. Negative for ear pain and sore throat.   Respiratory: Positive for cough. Negative for shortness of breath and wheezing.        Objective:   Physical Exam  Constitutional: He is oriented to person, place, and time. He appears well-developed and well-nourished. No distress.  HENT:  Head: Normocephalic and atraumatic.  Right Ear: Tympanic membrane normal.  Left Ear: Tympanic membrane normal.  Nose: Nose normal.  Mouth/Throat: Oropharynx is clear and moist.  Eyes: Right eye exhibits no discharge. Left eye exhibits no discharge.  Neck: Neck supple.  Cardiovascular: Normal rate, regular rhythm and normal heart sounds.  Pulmonary/Chest: Effort normal and breath sounds normal. No respiratory distress. He has no wheezes. He has no rales.  Lymphadenopathy:    He has no cervical adenopathy.  Neurological: He is alert and oriented to person, place, and time.  Skin: Skin is warm and dry.  Psychiatric: He has a normal mood and affect.  Nursing note and vitals reviewed.         Assessment & Plan:  1. Acute rhinosinusitis Like post-viral secondary bacterial infection with bronchitis. Given patient's history will treat with amoxicillin x 10 days. Warning signs discussed. F/u if symptoms worsen  or fail to improve.   2. Cough Pt reports benefit with tessalon perles in the past, will prescribe for as needed use to help with cough.   Dr. Lubertha SouthSteve Luking was consulted on this case and is in agreement with the above treatment plan.

## 2018-03-16 ENCOUNTER — Encounter: Payer: Self-pay | Admitting: Family Medicine

## 2018-03-16 ENCOUNTER — Ambulatory Visit (INDEPENDENT_AMBULATORY_CARE_PROVIDER_SITE_OTHER): Payer: 59 | Admitting: Family Medicine

## 2018-03-16 VITALS — BP 122/78 | Ht 72.0 in | Wt 374.0 lb

## 2018-03-16 DIAGNOSIS — R413 Other amnesia: Secondary | ICD-10-CM

## 2018-03-16 DIAGNOSIS — F419 Anxiety disorder, unspecified: Secondary | ICD-10-CM | POA: Diagnosis not present

## 2018-03-16 MED ORDER — ESCITALOPRAM OXALATE 20 MG PO TABS: 20.0000 mg | ORAL_TABLET | Freq: Every day | ORAL | 5 refills | Status: DC

## 2018-03-16 NOTE — Progress Notes (Signed)
   Subjective:    Patient ID: Clayton Jensen, male    DOB: [birth year], 37 y.o.   MRN: [id number]  HPI Pt here today for 2 month follow up. Pt was seen in October for Encephalitis. Pt states no problems since last visit.    Memory has improved considerably  Things ar e so much better   Pt has improved considerably   Has gotten his memory back, feels back to normal   Notes challenges with  Being anxious around large group of people    noight and day differece   Memory has improved  Still a bit of foggines fro time to time    Exercise maintanign    Sleep 6 to 8 per night, sems to require more than used to     irritable if does not        Review of Systems No headache, no major weight loss or weight gain, no chest pain no back pain abdominal pain no change in bowel habits complete ROS otherwise negative     Objective:   Physical Exam Alert vitals stable, NAD. Blood pressure good on repeat. HEENT normal. Lungs clear. Heart regular rate and rhythm. Patient sharp im response in cognition.       Assessment & Plan:  Impression status post encephalitis.  With substantial neurocognitive impact.  Has clinically improved substantially.  Neurocognitive appointment not until next May.  May well not needed by them discussed.  Maintain Lexapro.  Follow-up in 6 months rationale discussed

## 2018-03-28 ENCOUNTER — Ambulatory Visit: Payer: 59 | Admitting: Family Medicine

## 2018-03-28 ENCOUNTER — Encounter

## 2018-04-27 ENCOUNTER — Telehealth: Payer: Self-pay | Admitting: Family Medicine

## 2018-04-27 ENCOUNTER — Other Ambulatory Visit: Payer: Self-pay | Admitting: Family Medicine

## 2018-04-27 MED ORDER — AMOXICILLIN 500 MG PO TABS: 500.0000 mg | ORAL_TABLET | Freq: Three times a day (TID) | ORAL | 0 refills | Status: DC

## 2018-04-27 NOTE — Telephone Encounter (Signed)
URI sx cruise

## 2018-04-27 NOTE — Telephone Encounter (Signed)
This patient's had her upper respiratory illness is going on a cruise IV seeing his wife today she requested an antibiotic stating that he could not come in and they were leaving tomorrow he is not running fever no wheezing or difficulty breathing I went ahead and gave her a prescription of antibiotics for him with the instruction that if he has high fevers difficulty breathing or progressive illness he needs to immediately be checked

## 2018-06-20 ENCOUNTER — Ambulatory Visit: Payer: 59 | Admitting: Family Medicine

## 2018-06-20 ENCOUNTER — Encounter: Payer: Self-pay | Admitting: Family Medicine

## 2018-06-20 ENCOUNTER — Other Ambulatory Visit: Payer: Self-pay

## 2018-06-20 VITALS — BP 130/72 | Ht 72.0 in | Wt 383.0 lb

## 2018-06-20 DIAGNOSIS — F411 Generalized anxiety disorder: Secondary | ICD-10-CM

## 2018-06-20 MED ORDER — BUPROPION HCL ER (XL) 150 MG PO TB24: ORAL_TABLET | ORAL | 5 refills | Status: DC

## 2018-06-20 MED ORDER — ESCITALOPRAM OXALATE 20 MG PO TABS: 20.0000 mg | ORAL_TABLET | Freq: Every day | ORAL | 5 refills | Status: DC

## 2018-06-20 NOTE — Progress Notes (Signed)
   Subjective:    Patient ID: Clayton Jensen, male    DOB: 1980-05-30, 38 y.o.   MRN: [id number]  HPI  Patient is here today he says to follow up on viral meningitis. Overall work going pretty good   Last three weeks has taken a big down turn   Rakes lexapr  Sig challengdoes   Out     Anxiety and depression:He states he has been on Lexapro 20 mg once per day seems to be helping, but he is starting to have more anxiety issues.   Eating  A lot more   Walks some not a lot ,   None before the viral mionigitis    No long er   Scattered or trouble concentrating    However in the last month and a half anxiety has returned   becoming a bit more common  These fdays  Was  Prone to get anxious before  Review of Systems .rs No headache, no major weight loss or weight gain, no chest pain no back pain abdominal pain no change in bowel habits complete ROS otherwise negative     Objective:   Physical Exam  Alert vitals stable, NAD. Blood pressure good on repeat. HEENT normal. Lungs clear. Heart regular rate and rhythm.       Assessment & Plan:  Impression generalized anxiety.  Worsening.  Discussed will add Wellbutrin XL 150 daily to the current Lexapro dose.  Follow-up in 6 months

## 2018-09-20 ENCOUNTER — Encounter: Payer: 59 | Admitting: Family Medicine

## 2018-09-20 ENCOUNTER — Ambulatory Visit: Payer: 59 | Admitting: Family Medicine

## 2018-11-12 IMAGING — CR DG CHEST 2V
2 series · 2 of 2 positions shown · non-contrast
Comparison: Chest x-ray of 09/05/2017 .

CLINICAL DATA: History of right-sided VATS for drainage of empyema
on 08/30/2017, persistent cough

EXAM:
CHEST - 2 VIEW

[w chest pa]
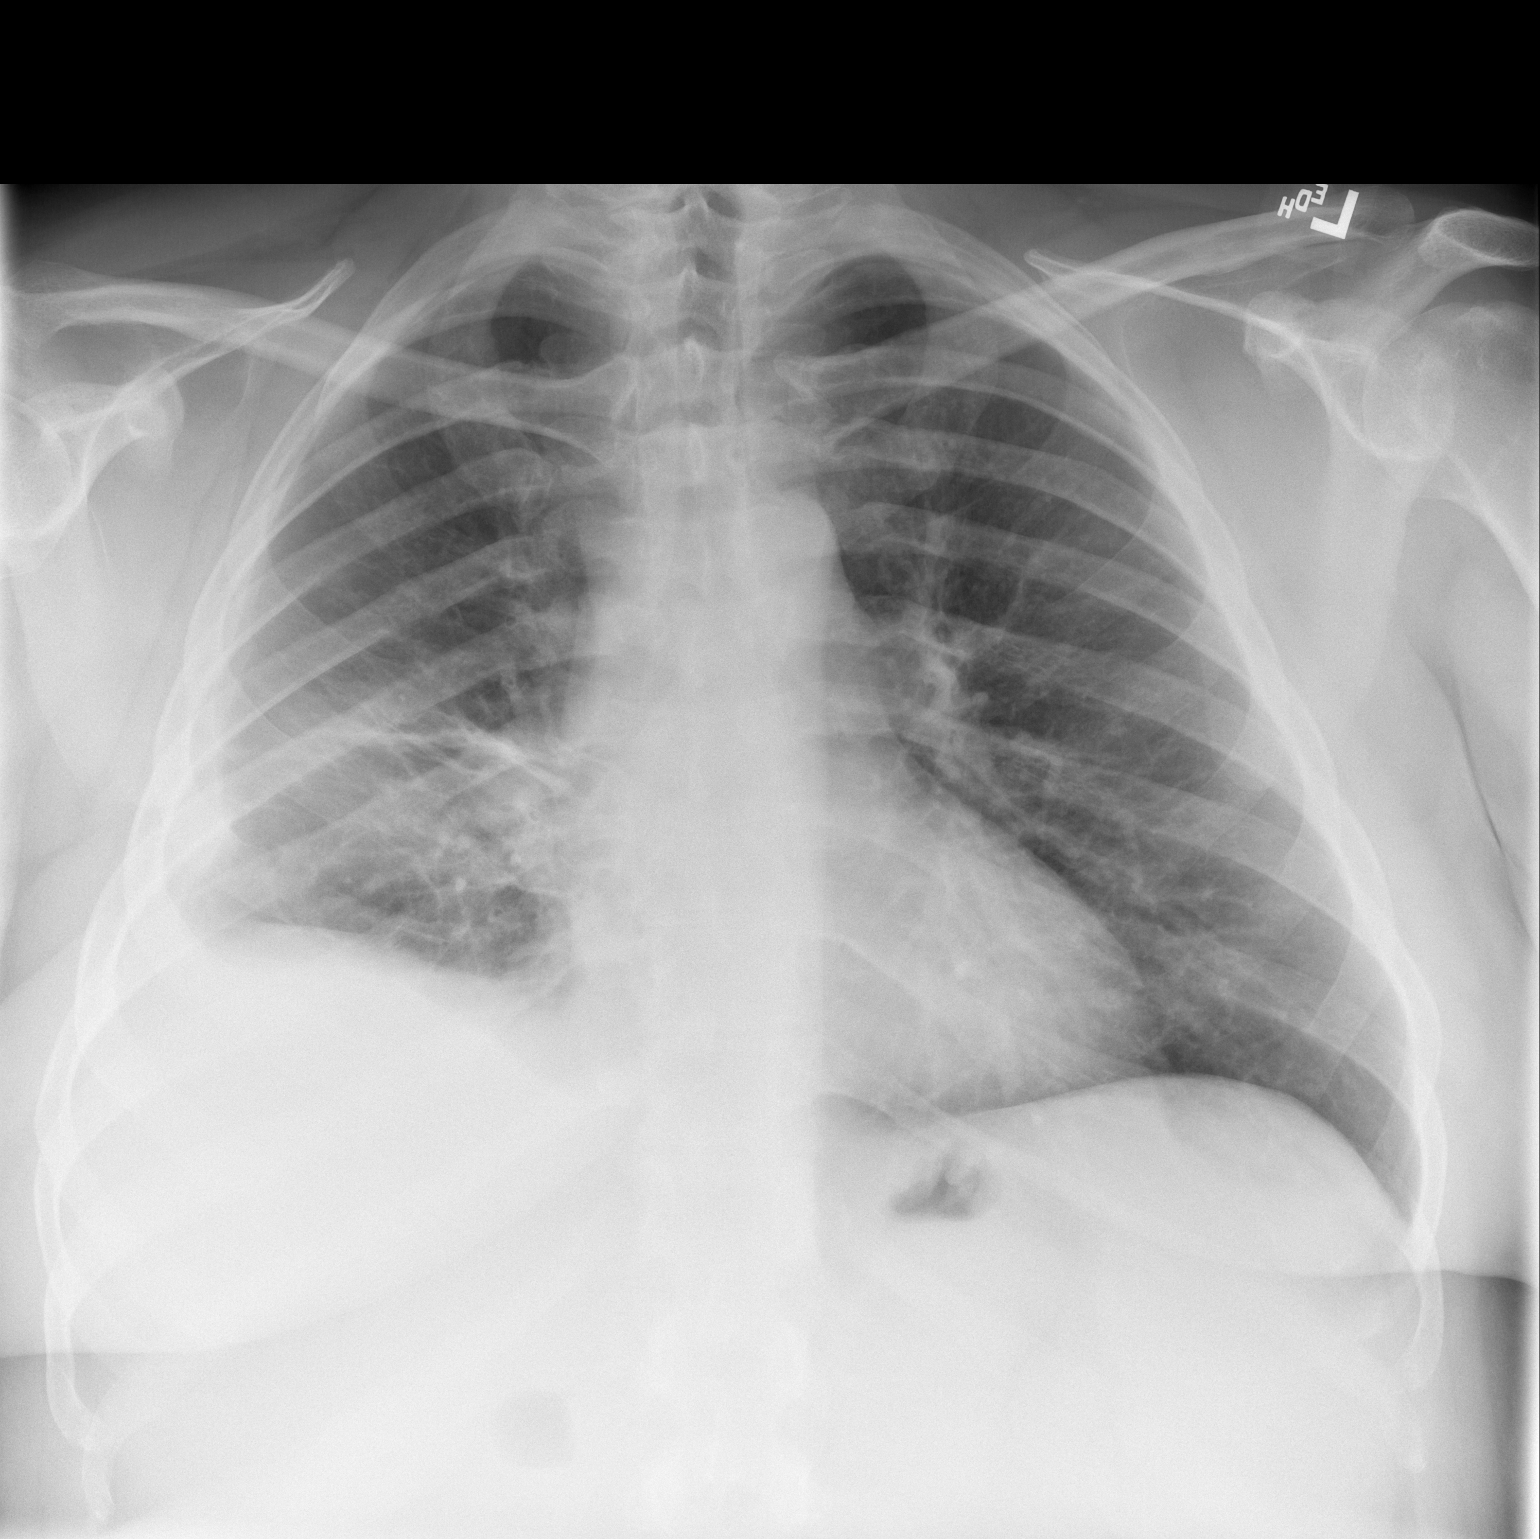

[w chest lat]
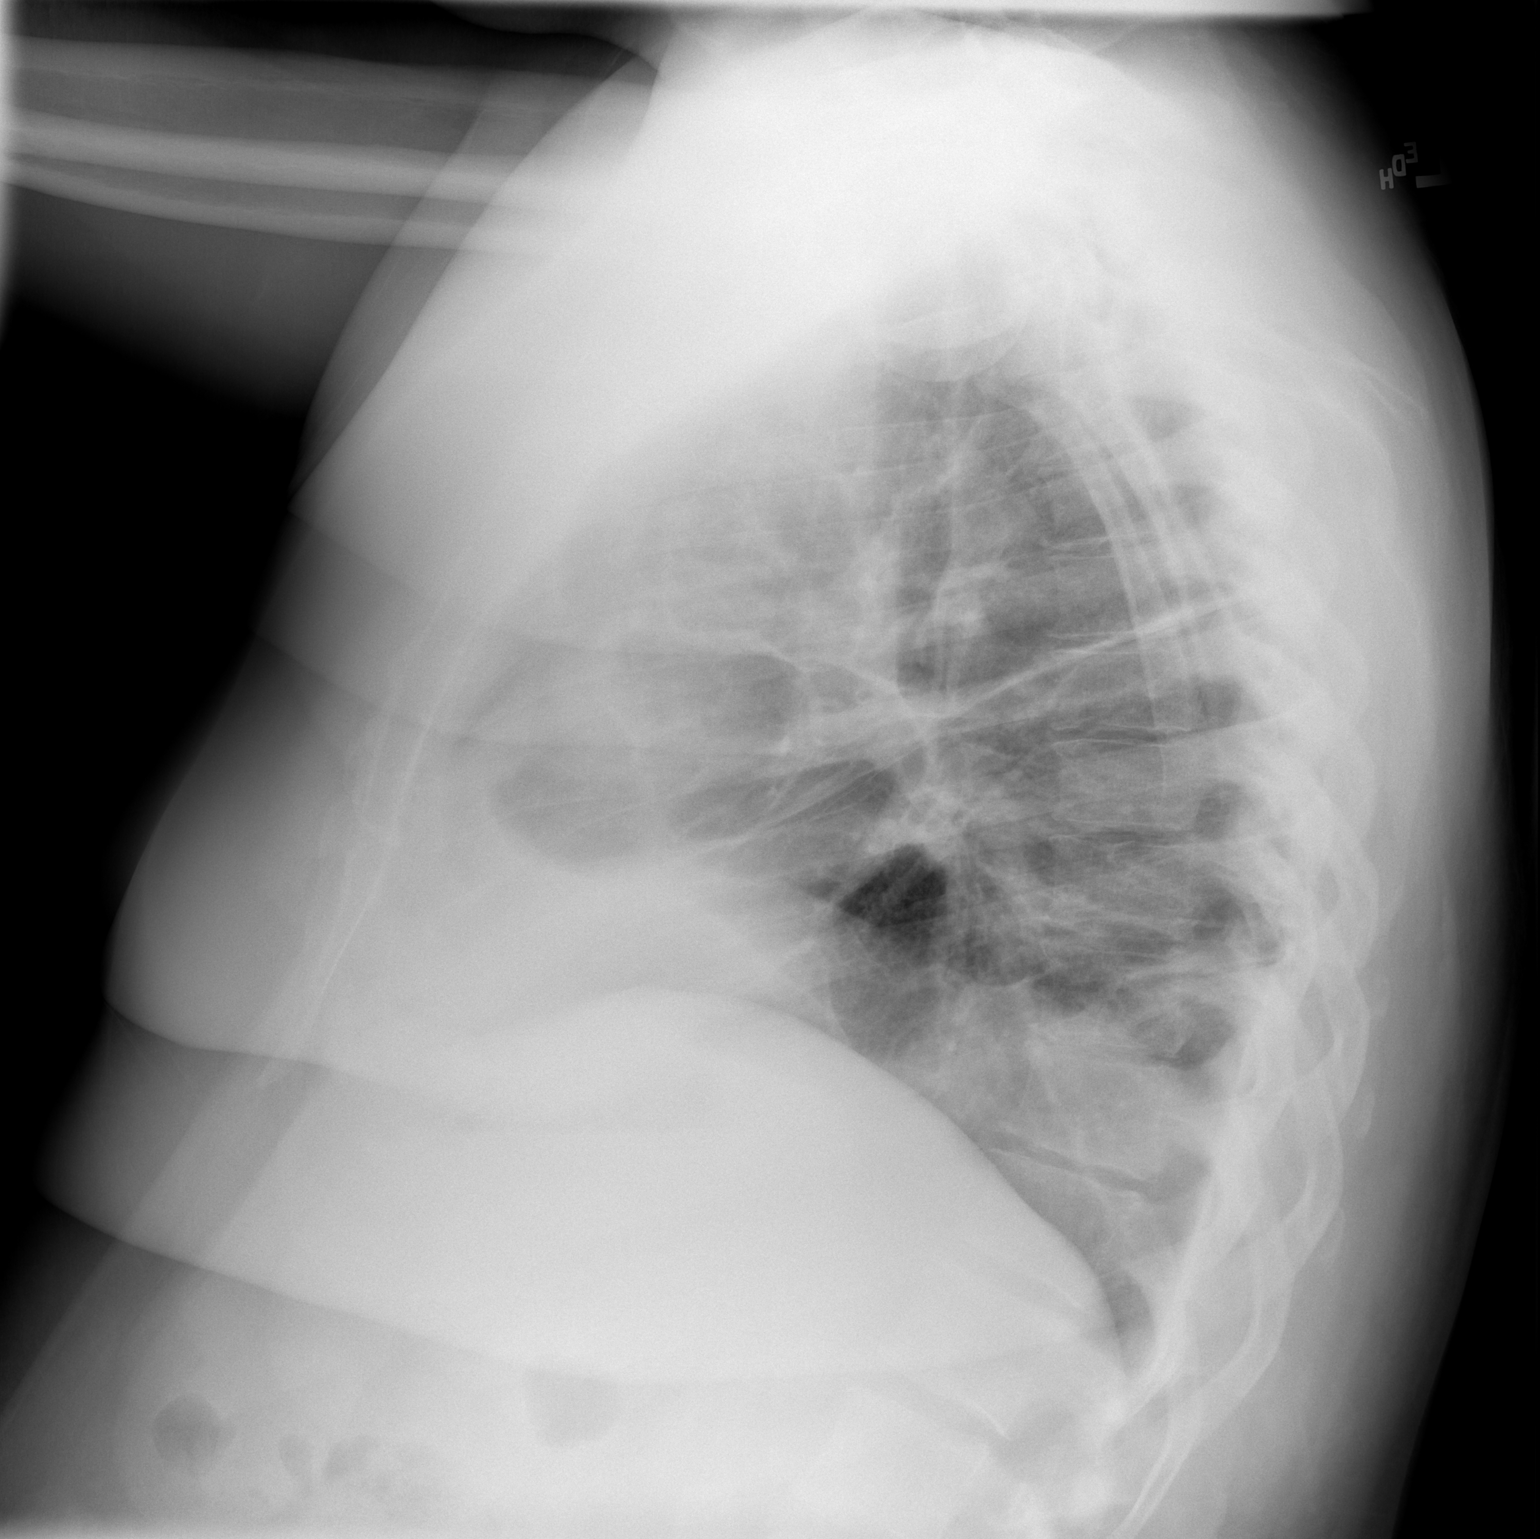

[2 of 2 positions shown; findings below may reference images not displayed]

FINDINGS: Aeration of the right lung base has improved slightly. There is
still some volume loss, and pleural thickening which may represent
residual pleural fluid. Air-fluid level noted in the right lung
anteriorly on the prior chest x-ray has resolved consistent with
resolution of small loculated right hydropneumothorax.
IMPRESSION: 1. Slight improvement in aeration at the right lung base.
2. Persistent opacities at the right lung base consistent with
volume loss and possible residual pleural fluid and/or thickening.

## 2019-01-01 ENCOUNTER — Other Ambulatory Visit: Payer: Self-pay

## 2019-01-01 ENCOUNTER — Ambulatory Visit (INDEPENDENT_AMBULATORY_CARE_PROVIDER_SITE_OTHER): Payer: 59 | Admitting: Family Medicine

## 2019-01-01 DIAGNOSIS — H6021 Malignant otitis externa, right ear: Secondary | ICD-10-CM

## 2019-01-01 MED ORDER — CEFDINIR 300 MG PO CAPS: 300.0000 mg | ORAL_CAPSULE | Freq: Two times a day (BID) | ORAL | 0 refills | Status: DC

## 2019-01-01 MED ORDER — NEOMYCIN-POLYMYXIN-HC 3.5-10000-1 OT SOLN: 4.0000 [drp] | Freq: Four times a day (QID) | OTIC | 0 refills | Status: DC

## 2019-01-01 NOTE — Progress Notes (Signed)
   Subjective:  Audio plus video  Patient ID: Clayton Jensen, male    DOB: 1980-06-19, 38 y.o.   MRN: [id number]  HPI  Patient calls with right ear pain and headache since last Thursday.  Virtual Visit via Video Note  I connected with Clayton Jensen on 01/01/19 at 11:00 AM EDT by a video enabled telemedicine application and verified that I am speaking with the correct person using two identifiers.  Location: Patient: home Provider: office   I discussed the limitations of evaluation and management by telemedicine and the availability of in person appointments. The patient expressed understanding and agreed to proceed.  History of Present Illness:    Observations/Objective:   Assessment and Plan:   Follow Up Instructions:    I discussed the assessment and treatment plan with the patient. The patient was provided an opportunity to ask questions and all were answered. The patient agreed with the plan and demonstrated an understanding of the instructions.   The patient was advised to call back or seek an in-person evaluation if the symptoms worsen or if the condition fails to improve as anticipated.  I provided 18 minutes of non-face-to-face time during this encounter.  Patient does have a tendency towards external otitis.  Very similar pain.  Outer ear tender and uncomfortable.  Some headache on that side also.  Pain sharp at times.  No associated congestion no sore throat no shortness of breath no cough   Review of Systems see above    Objective:   Physical Exam  Virtual    Assessment & Plan:  Impression probable right external otitis.  Antibiotic eardrops and oral antibiotics prescribed.  Warning signs discussed.  Doubt COVID-19 rationale discussed with patient

## 2019-01-11 ENCOUNTER — Other Ambulatory Visit: Payer: Self-pay

## 2019-01-11 DIAGNOSIS — Z20822 Contact with and (suspected) exposure to covid-19: Secondary | ICD-10-CM

## 2019-01-13 LAB — NOVEL CORONAVIRUS, NAA: SARS-CoV-2, NAA: NOT DETECTED

## 2019-02-02 ENCOUNTER — Other Ambulatory Visit: Payer: Self-pay | Admitting: Family Medicine

## 2019-02-05 NOTE — Telephone Encounter (Signed)
Please schedule visit and then route back to nurses to send in med

## 2019-02-05 NOTE — Telephone Encounter (Signed)
Ok times one, also rec chronic f u visit

## 2019-02-06 NOTE — Telephone Encounter (Signed)
Left message

## 2019-02-06 NOTE — Telephone Encounter (Signed)
Patient made an appt for tomorrow so you can wait and send after appt.

## 2019-02-07 ENCOUNTER — Ambulatory Visit (INDEPENDENT_AMBULATORY_CARE_PROVIDER_SITE_OTHER): Payer: 59 | Admitting: Family Medicine

## 2019-02-07 ENCOUNTER — Encounter: Payer: Self-pay | Admitting: Family Medicine

## 2019-02-07 ENCOUNTER — Other Ambulatory Visit: Payer: Self-pay

## 2019-02-07 DIAGNOSIS — F419 Anxiety disorder, unspecified: Secondary | ICD-10-CM | POA: Diagnosis not present

## 2019-02-07 DIAGNOSIS — R41840 Attention and concentration deficit: Secondary | ICD-10-CM

## 2019-02-07 DIAGNOSIS — R413 Other amnesia: Secondary | ICD-10-CM

## 2019-02-07 DIAGNOSIS — G4730 Sleep apnea, unspecified: Secondary | ICD-10-CM | POA: Diagnosis not present

## 2019-02-07 DIAGNOSIS — F411 Generalized anxiety disorder: Secondary | ICD-10-CM

## 2019-02-07 MED ORDER — ESCITALOPRAM OXALATE 20 MG PO TABS: 20.0000 mg | ORAL_TABLET | Freq: Every day | ORAL | 5 refills | Status: DC

## 2019-02-07 NOTE — Progress Notes (Signed)
   Subjective:  Audio plus video  Patient ID: [place] Jensen, male    DOB: [birth year], 38 y.o.   MRN: [id number]  HPImed check up on depression. Takes wellbutrin xl 150mg  and lexapro 20mg . phq9 done.  Virtual Visit via Telephone Note  I connected with [place] Jensen on 02/07/19 at  3:00 PM EST by telephone and verified that I am speaking with the correct person using two identifiers.  Location: Patient: home Provider: office   I discussed the limitations, risks, security and privacy concerns of performing an evaluation and management service by telephone and the availability of in person appointments. I also discussed with the patient that there may be a patient responsible charge related to this service. The patient expressed understanding and agreed to proceed.   History of Present Illness:    Observations/Objective:   Assessment and Plan:   Follow Up Instructions:    I discussed the assessment and treatment plan with the patient. The patient was provided an opportunity to ask questions and all were answered. The patient agreed with the plan and demonstrated an understanding of the instructions.   The patient was advised to call back or seek an in-person evaluation if the symptoms worsen or if the condition fails to improve as anticipated.  I provided 35 minutes of non-face-to-face time during this encounter.   Of note patient had a very extensive MyChart communication which was reviewed today to visit.  Patient also full cognitive assessment via the subspecialists at [facility].  This multipage note was also reviewed day of visit  Patient notes excessive sleepiness and drowsiness.  Daytime fatigue.  Substantial snoring.  Spouse reports frequent stoppage of breathing through the night.  Patient does struggle with morbid obesity  Patient notes Wellbutrin XL caused him symptoms and side effects.  Patient also notes he did he has had progressive difficulties with attention.  And  focusing.  This has created major problems with his workplace.  His assessments are dropping.  He realizes he has always had an issue with this.  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  No suicidal or homicidal thoughts.  Ongoing frustration though concern regarding his focusing is a major issue     Review of Systems No headache, no major weight loss or weight gain, no chest pain no back pain abdominal pain no change in bowel habits complete ROS otherwise negative     Objective:   Physical Exam  Virtual      Assessment & Plan:  Impression #1 adult ADHD.  Primarily inattentive.  Discussed at great length.  Pros and cons of medications discussed.  Will initiate Concerta 36 mg patient report progress in several weeks  2.  Depression anxiety fair control though not ideal.  Hold Wellbutrin since patient was not taking anyway.  Maintain Lexapro  3.  Likely sleep apnea with morbid obesity numerous risk factors very high early in sleep score we will press on with sleep study rationale discussed  Follow-up as scheduled.  Call us in several weeks with status on the Concerta may need to increase it

## 2019-02-08 ENCOUNTER — Telehealth: Payer: Self-pay | Admitting: Family Medicine

## 2019-02-08 MED ORDER — METHYLPHENIDATE HCL ER (OSM) 36 MG PO TBCR: 36.0000 mg | EXTENDED_RELEASE_TABLET | Freq: Every day | ORAL | 0 refills | Status: DC

## 2019-02-08 NOTE — Telephone Encounter (Signed)
Pt contacted and verbalized understanding.  

## 2019-02-08 NOTE — Telephone Encounter (Signed)
Done yest I had not signed now signed

## 2019-02-08 NOTE — Telephone Encounter (Signed)
Patient states you were going to send in prescription for concerta  At his appointment yesterday but nothing was sent in.[facility] Please advise

## 2019-02-12 ENCOUNTER — Encounter: Payer: Self-pay | Admitting: Family Medicine

## 2019-02-20 ENCOUNTER — Encounter: Payer: Self-pay | Admitting: Family Medicine

## 2019-02-20 NOTE — Telephone Encounter (Signed)
Clayton Kirschner, MD  You 3 minutes ago (10:05 AM)   Plz try to give it one more week contact us in a week with similar update

## 2019-03-06 ENCOUNTER — Encounter: Payer: Self-pay | Admitting: Family Medicine

## 2019-03-06 MED ORDER — METHYLPHENIDATE HCL ER (OSM) 54 MG PO TBCR: EXTENDED_RELEASE_TABLET | ORAL | 0 refills | Status: DC

## 2019-04-03 ENCOUNTER — Encounter: Payer: Self-pay | Admitting: Family Medicine

## 2019-04-05 MED ORDER — METHYLPHENIDATE HCL 10 MG PO TABS: ORAL_TABLET | ORAL | 0 refills | Status: DC

## 2019-04-05 NOTE — Telephone Encounter (Signed)
Clayton Kirschner, MD  You 6 minutes ago (9:13 AM)   Contact pt , he could take a reg acting methylphenidate 10 mg at [place], if willing rx enough o last thru next visit

## 2019-04-10 ENCOUNTER — Other Ambulatory Visit: Payer: Self-pay

## 2019-04-10 ENCOUNTER — Ambulatory Visit (INDEPENDENT_AMBULATORY_CARE_PROVIDER_SITE_OTHER): Payer: 59 | Admitting: Family Medicine

## 2019-04-10 DIAGNOSIS — R41840 Attention and concentration deficit: Secondary | ICD-10-CM

## 2019-04-10 DIAGNOSIS — R413 Other amnesia: Secondary | ICD-10-CM

## 2019-04-10 DIAGNOSIS — F411 Generalized anxiety disorder: Secondary | ICD-10-CM

## 2019-04-10 MED ORDER — ESCITALOPRAM OXALATE 20 MG PO TABS: 20.0000 mg | ORAL_TABLET | Freq: Every day | ORAL | 5 refills | Status: DC

## 2019-04-10 NOTE — Progress Notes (Signed)
   Subjective:    Patient ID: [place] Jensen, male    DOB: [birth year], 40 y.o.   MRN: [id number]  HPI med check up.  Takes concerta 54mg  one qam and ritalin 10mg  at 1:30 every day. Pt states he is doing well and just needs refills.   Takes lexapro 20mg  one daily. phq9 done and pt states he is doing well and no concerns.   Virtual Visit via Video Note  I connected with on 04/10/19 at  2:30 PM EST by a video enabled telemedicine application and verified that I am speaking with the correct person using two identifiers.  Location: Patient: home Provider: office   I discussed the limitations of evaluation and management by telemedicine and the availability of in person appointments. The patient expressed understanding and agreed to proceed.  History of Present Illness:    Observations/Objective:   Assessment and Plan:   Follow Up Instructions:    I discussed the assessment and treatment plan with the patient. The patient was provided an opportunity to ask questions and all were answered. The patient agreed with the plan and demonstrated an understanding of the instructions.   The patient was advised to call back or seek an in-person evaluation if the symptoms worsen or if the condition fails to improve as anticipated.  I provided 20 minutes of non-face-to-face time during this encounter.        Review of Systems No headache, no major weight loss or weight gain, no chest pain no back pain abdominal pain no change in bowel habits complete ROS otherwise negative     Objective:   Physical Exam  Virtual      Assessment & Plan:  Impression ADHD.  See prior phone messages.  Adding an afternoon rapid acting dose seems of helped a lot.  Patient to maintain meds  2.  Mood disorder.  Clinically stable patient to maintain same meds  Follow-up in 4 months diet exercise encouraged

## 2019-04-11 MED ORDER — METHYLPHENIDATE HCL ER (OSM) 54 MG PO TBCR: EXTENDED_RELEASE_TABLET | ORAL | 0 refills | Status: DC

## 2019-04-11 MED ORDER — METHYLPHENIDATE HCL 10 MG PO TABS: ORAL_TABLET | ORAL | 0 refills | Status: DC

## 2019-04-11 MED ORDER — METHYLPHENIDATE HCL ER (OSM) 54 MG PO TBCR: 54.0000 mg | EXTENDED_RELEASE_TABLET | ORAL | 0 refills | Status: DC

## 2019-05-07 ENCOUNTER — Encounter: Payer: Self-pay | Admitting: Family Medicine

## 2019-09-26 ENCOUNTER — Other Ambulatory Visit: Payer: Self-pay | Admitting: Family Medicine

## 2019-09-27 MED ORDER — METHYLPHENIDATE HCL ER (OSM) 54 MG PO TBCR: EXTENDED_RELEASE_TABLET | ORAL | 0 refills | Status: DC

## 2019-09-27 MED ORDER — METHYLPHENIDATE HCL 10 MG PO TABS: ORAL_TABLET | ORAL | 0 refills | Status: DC

## 2019-09-27 NOTE — Telephone Encounter (Signed)
Scheduled 7/19

## 2019-10-22 ENCOUNTER — Ambulatory Visit: Payer: 59 | Admitting: Family Medicine

## 2019-10-22 ENCOUNTER — Other Ambulatory Visit: Payer: Self-pay

## 2019-10-22 VITALS — BP 120/72 | HR 92 | Temp 98.1°F | Ht 72.0 in | Wt 397.2 lb

## 2019-10-22 DIAGNOSIS — Z0000 Encounter for general adult medical examination without abnormal findings: Secondary | ICD-10-CM

## 2019-10-22 DIAGNOSIS — F900 Attention-deficit hyperactivity disorder, predominantly inattentive type: Secondary | ICD-10-CM

## 2019-10-22 MED ORDER — METHYLPHENIDATE HCL 10 MG PO TABS: ORAL_TABLET | ORAL | 0 refills | Status: DC

## 2019-10-22 MED ORDER — METHYLPHENIDATE HCL ER (OSM) 54 MG PO TBCR: EXTENDED_RELEASE_TABLET | ORAL | 0 refills | Status: DC

## 2019-10-22 MED ORDER — METHYLPHENIDATE HCL ER (OSM) 54 MG PO TBCR: 54.0000 mg | EXTENDED_RELEASE_TABLET | ORAL | 0 refills | Status: DC

## 2019-10-22 MED ORDER — METHYLPHENIDATE HCL 10 MG PO TABS
ORAL_TABLET | ORAL | 0 refills | Status: DC
Start: 2019-10-22 — End: 2020-03-27

## 2019-10-22 NOTE — Progress Notes (Signed)
Patient ID: [place] Jensen, male    DOB: [birth year], 39 y.o.   MRN: [id number]   Chief Complaint  Patient presents with  . ADHD    follow up   Subjective:    HPI H/o mengitis-encephalitis in 2019. After that had to focus, fogginess. Brought on by an encephalitis and developed some concentration issues.  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 Denies chest pain, headaches, dizziness, palpitations, or significant weight loss.  Had open thoracotomy- infection in pleura and empyema. Not seeing surgeon or specialist anymore.  Pt has had increase in weight in the last couple years.  Wanting to work on his diet.   Medical History [place] has a past medical history of Chronic lower back pain, History of kidney stones, and OSA (obstructive sleep apnea).   Outpatient Encounter Medications as of 10/22/2019  Medication Sig  . escitalopram (LEXAPRO) 20 MG tablet Take 1 tablet (20 mg total) by mouth daily.  . methylphenidate (CONCERTA) 54 MG PO CR tablet Take one tablet po each morning  . methylphenidate (CONCERTA) 54 MG PO CR tablet Take one tablet po each morning  . methylphenidate (CONCERTA) 54 MG PO CR tablet Take 1 tablet (54 mg total) by mouth every morning.  . methylphenidate (RITALIN) 10 MG tablet Take one tablet po each day at 1:30 pm  . methylphenidate (RITALIN) 10 MG tablet Take one table po each day at 1:30 pm.  . methylphenidate (RITALIN) 10 MG tablet Take one table po each day at 1:30 pm  . methylphenidate (RITALIN) 10 MG tablet Take one tablet po each day at 1:30 pm  . methylphenidate 54 MG PO CR tablet Take one tablet po each morning  . [DISCONTINUED] methylphenidate (CONCERTA) 54 MG PO CR tablet Take one tablet po each morning  . [DISCONTINUED] methylphenidate (CONCERTA) 54 MG PO CR tablet Take one tablet po each morning  . [DISCONTINUED] methylphenidate (CONCERTA) 54 MG PO CR tablet Take 1 tablet (54 mg total) by mouth every morning.  . [DISCONTINUED] methylphenidate (RITALIN)  10 MG tablet Take one tablet po each day at 1:30 pm  . [DISCONTINUED] methylphenidate (RITALIN) 10 MG tablet Take one table po each day at 1:30 pm.  . [DISCONTINUED] methylphenidate (RITALIN) 10 MG tablet Take one table po each day at 1:30 pm  . [DISCONTINUED] methylphenidate (RITALIN) 10 MG tablet Take one tablet po each day at 1:30 pm  . [DISCONTINUED] methylphenidate 54 MG PO CR tablet Take one tablet po each morning   No facility-administered encounter medications on file as of 10/22/2019.     Review of Systems  Constitutional: Negative for chills and fever.  HENT: Negative for congestion, rhinorrhea and sore throat.   Respiratory: Negative for cough, shortness of breath and wheezing.   Cardiovascular: Negative for chest pain and leg swelling.  Gastrointestinal: Negative for abdominal pain, diarrhea, nausea and vomiting.  Genitourinary: Negative for dysuria and frequency.  Skin: Negative for rash.  Neurological: Negative for dizziness, weakness and headaches.     Vitals BP 120/72   Pulse 92   Temp 98.1 F (36.7 C) (Oral)   Ht 6' (1.829 m)   Wt (!) 397 lb 3.2 oz (180.2 kg)   SpO2 97%   BMI 53.87 kg/m   Objective:   Physical Exam Vitals and nursing note reviewed.  Constitutional:      General: He is not in acute distress.    Appearance: Normal appearance. He is obese. He is not ill-appearing.  HENT:     Head:  Normocephalic.     Nose: Nose normal. No congestion.     Mouth/Throat:     Mouth: Mucous membranes are moist.     Pharynx: No oropharyngeal exudate.  Eyes:     Extraocular Movements: Extraocular movements intact.     Conjunctiva/sclera: Conjunctivae normal.     Pupils: Pupils are equal, round, and reactive to light.  Cardiovascular:     Rate and Rhythm: Normal rate and regular rhythm.     Pulses: Normal pulses.     Heart sounds: Normal heart sounds. No murmur heard.   Pulmonary:     Effort: Pulmonary effort is normal.     Breath sounds: Normal breath  sounds. No wheezing, rhonchi or rales.  Musculoskeletal:        General: Normal range of motion.     Right lower leg: No edema.     Left lower leg: No edema.  Skin:    General: Skin is warm and dry.     Findings: No rash.  Neurological:     General: No focal deficit present.     Mental Status: He is alert and oriented to person, place, and time.     Cranial Nerves: No cranial nerve deficit.  Psychiatric:        Mood and Affect: Mood normal.        Behavior: Behavior normal.        Thought Content: Thought content normal.        Judgment: Judgment normal.      Assessment and Plan   1. Attention deficit hyperactivity disorder (ADHD), predominantly inattentive type - methylphenidate (CONCERTA) 54 MG PO CR tablet; Take one tablet po each morning  Dispense: 30 tablet; Refill: 0 - methylphenidate (CONCERTA) 54 MG PO CR tablet; Take one tablet po each morning  Dispense: 30 tablet; Refill: 0 - methylphenidate (CONCERTA) 54 MG PO CR tablet; Take 1 tablet (54 mg total) by mouth every morning.  Dispense: 30 tablet; Refill: 0 - methylphenidate 54 MG PO CR tablet; Take one tablet po each morning  Dispense: 30 tablet; Refill: 0 - methylphenidate (RITALIN) 10 MG tablet; Take one tablet po each day at 1:30 pm  Dispense: 30 tablet; Refill: 0 - methylphenidate (RITALIN) 10 MG tablet; Take one table po each day at 1:30 pm.  Dispense: 30 tablet; Refill: 0 - methylphenidate (RITALIN) 10 MG tablet; Take one table po each day at 1:30 pm  Dispense: 30 tablet; Refill: 0 - methylphenidate (RITALIN) 10 MG tablet; Take one tablet po each day at 1:30 pm  Dispense: 30 tablet; Refill: 0  2. Laboratory tests ordered as part of a complete physical exam (CPE) - CMP14+EGFR - CBC - Hemoglobin A1c - Lipid panel - TSH  3. Morbid obesity ([place])   Sent in refill for concerta and ritalin. Pt to have physical and f/u on adhd on next visit.  Labs ordered.  Gave infor on calorie counting and diet modifications with  dec carbs.  F/u 4 months.

## 2020-01-14 ENCOUNTER — Other Ambulatory Visit: Payer: Self-pay | Admitting: *Deleted

## 2020-01-14 MED ORDER — ESCITALOPRAM OXALATE 20 MG PO TABS: 20.0000 mg | ORAL_TABLET | Freq: Every day | ORAL | 1 refills | Status: DC

## 2020-03-19 ENCOUNTER — Other Ambulatory Visit: Payer: Self-pay | Admitting: Family Medicine

## 2020-03-19 NOTE — Telephone Encounter (Signed)
Please have pt set up appt. Thank you 

## 2020-03-19 NOTE — Telephone Encounter (Signed)
Pt supposed to have f/u in nov for his add meds.  pls have pt f/u in the next 1-2 month for add meds and  Lexapro.  Thx. Dr Karie Schwalbe

## 2020-03-21 ENCOUNTER — Other Ambulatory Visit: Payer: Self-pay

## 2020-03-21 DIAGNOSIS — F900 Attention-deficit hyperactivity disorder, predominantly inattentive type: Secondary | ICD-10-CM

## 2020-03-21 MED ORDER — METHYLPHENIDATE HCL ER (OSM) 54 MG PO TBCR: EXTENDED_RELEASE_TABLET | ORAL | 0 refills | Status: DC

## 2020-03-21 NOTE — Telephone Encounter (Signed)
Pt needs refill on methylphenidate (CONCERTA) 54 MG PO CR tablet pt took last med today made appt for 12/23 for Med Check  Sent to [id number] - [place], [place] - [address]   Pt call back [phone]

## 2020-03-21 NOTE — Telephone Encounter (Signed)
Please pend I will sign 

## 2020-03-27 ENCOUNTER — Encounter: Payer: Self-pay | Admitting: Family Medicine

## 2020-03-27 ENCOUNTER — Ambulatory Visit: Payer: 59 | Admitting: Family Medicine

## 2020-03-27 ENCOUNTER — Other Ambulatory Visit: Payer: Self-pay

## 2020-03-27 VITALS — BP 134/80 | HR 88 | Temp 97.1°F | Ht 72.0 in | Wt 396.0 lb

## 2020-03-27 DIAGNOSIS — F988 Other specified behavioral and emotional disorders with onset usually occurring in childhood and adolescence: Secondary | ICD-10-CM

## 2020-03-27 DIAGNOSIS — F900 Attention-deficit hyperactivity disorder, predominantly inattentive type: Secondary | ICD-10-CM | POA: Diagnosis not present

## 2020-03-27 DIAGNOSIS — Z0000 Encounter for general adult medical examination without abnormal findings: Secondary | ICD-10-CM

## 2020-03-27 MED ORDER — METHYLPHENIDATE HCL 10 MG PO TABS: ORAL_TABLET | ORAL | 0 refills | Status: DC

## 2020-03-27 MED ORDER — METHYLPHENIDATE HCL ER (OSM) 54 MG PO TBCR
EXTENDED_RELEASE_TABLET | ORAL | 0 refills | Status: DC
Start: 2020-03-27 — End: 2020-08-11

## 2020-03-27 MED ORDER — METHYLPHENIDATE HCL ER (OSM) 54 MG PO TBCR: 54.0000 mg | EXTENDED_RELEASE_TABLET | ORAL | 0 refills | Status: DC

## 2020-03-27 MED ORDER — METHYLPHENIDATE HCL ER (OSM) 54 MG PO TBCR: EXTENDED_RELEASE_TABLET | ORAL | 0 refills | Status: DC

## 2020-03-27 NOTE — Progress Notes (Signed)
Patient ID: [place] Jensen, male    DOB: 10-06-1980, 39 y.o.   MRN: [id number]   Chief Complaint  Patient presents with  . ADD   Subjective:    HPI   ADD check up.  Pt states concerta 54 mg only last till about 11am or 12pm and then wears off.  Pt taking at 5:30a-6am wearing off about 6 hrs later. Working till about 5pm. Taking the ritalin 70m in the afternoon.  Takes lexapro for depression and states it is working well.   Pt went to psychiatry in past.  2 yrs ago.  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 And is dealing with the side effects and has been put on the add meds and anxiety. Had social anxiety and nervous in loud situations/commotion. Hard to concentrate. Went to neuropsychiatry testing.  No sleep issues.  Waking up in night some times and getting back to sleep easily. No chest pain or palp, or headaches.   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   Outpatient Encounter Medications as of 03/27/2020  Medication Sig  . escitalopram (LEXAPRO) 20 MG tablet TAKE 1 TABLET(20 MG) BY MOUTH DAILY  . [DISCONTINUED] methylphenidate (CONCERTA) 54 MG PO CR tablet Take one tablet po each morning  . [DISCONTINUED] methylphenidate (CONCERTA) 54 MG PO CR tablet Take 1 tablet (54 mg total) by mouth every morning.  . [DISCONTINUED] methylphenidate (CONCERTA) 54 MG PO CR tablet Take one tablet po each morning  . [DISCONTINUED] methylphenidate (RITALIN) 10 MG tablet Take one tablet po each day at 1:30 pm  . [DISCONTINUED] methylphenidate (RITALIN) 10 MG tablet Take one table po each day at 1:30 pm.  . [DISCONTINUED] methylphenidate (RITALIN) 10 MG tablet Take one table po each day at 1:30 pm  . [DISCONTINUED] methylphenidate (RITALIN) 10 MG  tablet Take one tablet po each day at 1:30 pm  . [DISCONTINUED] methylphenidate 54 MG PO CR tablet Take one tablet po each morning  . methylphenidate (CONCERTA) 54 MG PO CR tablet Take one tablet po each morning  . methylphenidate (CONCERTA) 54 MG PO CR tablet Take 1 tablet (54 mg total) by mouth every morning.  . methylphenidate (CONCERTA) 54 MG PO CR tablet Take one tablet po each morning  . methylphenidate (RITALIN) 10 MG tablet Take one tablet po each day at 1:30 pm  . methylphenidate (RITALIN) 10 MG tablet Take one table po each day at 1:30 pm.  . methylphenidate (RITALIN) 10 MG tablet Take one table po each day at 1:30 pm   No facility-administered encounter medications on file as of 03/27/2020.     Review of Systems  Constitutional: Negative for chills and fever.  HENT: Negative for congestion, rhinorrhea and sore throat.   Respiratory: Negative for cough, shortness of breath and wheezing.   Cardiovascular: Negative for chest pain and leg swelling.  Gastrointestinal: Negative for abdominal pain, diarrhea, nausea and vomiting.  Genitourinary: Negative for dysuria and frequency.  Skin: Negative for rash.  Neurological: Negative for dizziness, weakness and headaches.  Psychiatric/Behavioral: Negative for behavioral problems, decreased concentration, dysphoric mood, self-injury, sleep disturbance and suicidal ideas. The patient is not nervous/anxious and is not hyperactive.      Vitals BP 134/80   Pulse 88   Temp (!) 97.1 F (36.2 C)   Ht 6' (1.829  m)   Wt (!) 396 lb (179.6 kg)   SpO2 98%   BMI 53.71 kg/m   Objective:   Physical Exam Vitals and nursing note reviewed.  Constitutional:      General: He is not in acute distress.    Appearance: Normal appearance. He is not ill-appearing.  HENT:     Head: Normocephalic.     Nose: Nose normal. No congestion.     Mouth/Throat:     Mouth: Mucous membranes are moist.     Pharynx: No oropharyngeal exudate.  Eyes:      Extraocular Movements: Extraocular movements intact.     Conjunctiva/sclera: Conjunctivae normal.     Pupils: Pupils are equal, round, and reactive to light.  Cardiovascular:     Rate and Rhythm: Normal rate and regular rhythm.     Pulses: Normal pulses.     Heart sounds: Normal heart sounds. No murmur heard.   Pulmonary:     Effort: Pulmonary effort is normal.     Breath sounds: Normal breath sounds. No wheezing, rhonchi or rales.  Musculoskeletal:        General: Normal range of motion.     Right lower leg: No edema.     Left lower leg: No edema.  Skin:    General: Skin is warm and dry.     Findings: No rash.  Neurological:     General: No focal deficit present.     Mental Status: He is alert and oriented to person, place, and time.     Cranial Nerves: No cranial nerve deficit.  Psychiatric:        Mood and Affect: Mood normal.        Behavior: Behavior normal.        Thought Content: Thought content normal.        Judgment: Judgment normal.      Assessment and Plan   1. Attention deficit disorder (ADD) without hyperactivity - ToxASSURE Select 13 (MW), Urine  2. Laboratory tests ordered as part of a complete physical exam (CPE) - CBC - CMP14+EGFR - Lipid panel - ToxASSURE Select 13 (MW), Urine  3. Attention deficit hyperactivity disorder (ADHD), predominantly inattentive type - methylphenidate (CONCERTA) 54 MG PO CR tablet; Take one tablet po each morning  Dispense: 30 tablet; Refill: 0 - methylphenidate (CONCERTA) 54 MG PO CR tablet; Take 1 tablet (54 mg total) by mouth every morning.  Dispense: 30 tablet; Refill: 0 - methylphenidate (RITALIN) 10 MG tablet; Take one tablet po each day at 1:30 pm  Dispense: 30 tablet; Refill: 0 - methylphenidate (RITALIN) 10 MG tablet; Take one table po each day at 1:30 pm.  Dispense: 30 tablet; Refill: 0 - methylphenidate (RITALIN) 10 MG tablet; Take one table po each day at 1:30 pm  Dispense: 30 tablet; Refill: 0 - ToxASSURE  Select 13 (MW), Urine   Add- stable.  No side effects, will cont.  Pt given refills of the concerta 54 mg CR and ritalin 10mg.   [facility]. Last fill 03/22/20.  F/u 3mo or prn.     

## 2020-04-08 LAB — TOXASSURE SELECT 13 (MW), URINE

## 2020-04-09 NOTE — Telephone Encounter (Signed)
Left message to return call 

## 2020-04-16 DIAGNOSIS — U071 COVID-19: Secondary | ICD-10-CM

## 2020-04-22 ENCOUNTER — Other Ambulatory Visit: Payer: 59

## 2020-04-23 ENCOUNTER — Encounter: Payer: Self-pay | Admitting: *Deleted

## 2020-04-23 ENCOUNTER — Telehealth: Payer: Self-pay | Admitting: *Deleted

## 2020-04-23 NOTE — Telephone Encounter (Signed)
Called to discuss with patient about COVID-19 symptoms and the use of one of the available treatments for those with mild to moderate Covid symptoms and at a high risk of hospitalization.  Pt appears to qualify for outpatient treatment due to co-morbid conditions and/or a member of an at-risk group in accordance with the FDA Emergency Use Authorization.    Symptom onset:  Vaccinated:  Booster?  Immunocompromised?  Qualifiers:   Unable to reach pt - Left VM to return call for information  [place] Jensen   

## 2020-04-23 NOTE — Telephone Encounter (Signed)
  This encounter was created in error - please disregard.  Drue Stager, RN

## 2020-04-25 ENCOUNTER — Other Ambulatory Visit: Payer: 59

## 2020-05-17 ENCOUNTER — Other Ambulatory Visit: Payer: Self-pay | Admitting: Family Medicine

## 2020-08-04 ENCOUNTER — Telehealth: Payer: Self-pay | Admitting: Family Medicine

## 2020-08-04 NOTE — Telephone Encounter (Signed)
Needs appt to follow up on lexapro. Thx. Dr. Ladona Ridgel

## 2020-08-05 NOTE — Telephone Encounter (Signed)
Please contact patient to have him set up appt to follow up on Lexapro. Thank you!

## 2020-08-05 NOTE — Telephone Encounter (Signed)
Patient has appointment 5/9

## 2020-08-05 NOTE — Telephone Encounter (Signed)
Sent my chart message to schedule appointment.

## 2020-08-11 ENCOUNTER — Other Ambulatory Visit: Payer: Self-pay

## 2020-08-11 ENCOUNTER — Telehealth: Payer: Self-pay | Admitting: *Deleted

## 2020-08-11 ENCOUNTER — Telehealth (INDEPENDENT_AMBULATORY_CARE_PROVIDER_SITE_OTHER): Payer: Self-pay | Admitting: Family Medicine

## 2020-08-11 DIAGNOSIS — F411 Generalized anxiety disorder: Secondary | ICD-10-CM

## 2020-08-11 DIAGNOSIS — F988 Other specified behavioral and emotional disorders with onset usually occurring in childhood and adolescence: Secondary | ICD-10-CM

## 2020-08-11 DIAGNOSIS — F900 Attention-deficit hyperactivity disorder, predominantly inattentive type: Secondary | ICD-10-CM

## 2020-08-11 MED ORDER — METHYLPHENIDATE HCL 10 MG PO TABS: ORAL_TABLET | ORAL | 0 refills | Status: DC

## 2020-08-11 MED ORDER — METHYLPHENIDATE HCL ER (OSM) 54 MG PO TBCR
EXTENDED_RELEASE_TABLET | ORAL | 0 refills | Status: DC
Start: 2020-08-11 — End: 2020-08-13

## 2020-08-11 MED ORDER — ESCITALOPRAM OXALATE 20 MG PO TABS: ORAL_TABLET | ORAL | 0 refills | Status: DC

## 2020-08-11 MED ORDER — METHYLPHENIDATE HCL ER (OSM) 54 MG PO TBCR
EXTENDED_RELEASE_TABLET | ORAL | 0 refills | Status: DC
Start: 2020-08-11 — End: 2020-12-02

## 2020-08-11 MED ORDER — METHYLPHENIDATE HCL ER (OSM) 54 MG PO TBCR: 54.0000 mg | EXTENDED_RELEASE_TABLET | ORAL | 0 refills | Status: DC

## 2020-08-11 NOTE — Progress Notes (Signed)
Patient ID: Clayton Jensen, male    DOB: 10-09-1980, 40 y.o.   MRN: [id number]  Virtual Visit via Telephone Note  I connected with Clayton Jensen on 08/11/20 at  2:50 PM EDT by telephone and verified that I am speaking with the correct person using two identifiers.  Location: Patient: home Provider: home   I discussed the limitations, risks, security and privacy concerns of performing an evaluation and management service by telephone and the availability of in person appointments. I also discussed with the patient that there may be a patient responsible charge related to this service. The patient expressed understanding and agreed to proceed.   Chief Complaint  Patient presents with  . ADHD    Follow up   Subjective:    HPI Patient states he is doing well on current meds with no problems or concerns.  Received request for refill of ADHD Medication. Annalee Genta, DO Current Outpatient Medications  Medication Sig Dispense Refill  . escitalopram (LEXAPRO) 20 MG tablet Take 1 tab p.o. daily.  Needs appt for more refills. 90 tablet 0  . methylphenidate (CONCERTA) 54 MG PO CR tablet Take one tablet po each morning 30 tablet 0  . methylphenidate (CONCERTA) 54 MG PO CR tablet Take 1 tablet (54 mg total) by mouth every morning. 30 tablet 0  . methylphenidate (CONCERTA) 54 MG PO CR tablet Take one tablet po each morning 30 tablet 0  . methylphenidate (RITALIN) 10 MG tablet Take one tablet po each day at 1:30 pm 30 tablet 0  . methylphenidate (RITALIN) 10 MG tablet Take one table po each day at 1:30 pm. 30 tablet 0  . methylphenidate (RITALIN) 10 MG tablet Take one table po each day at 1:30 pm 30 tablet 0   No current facility-administered medications for this visit.   Last refill date of Pyschostimulants: Concerta Cr 54 mg and ritalin 10mg . Medication: 06/17/20.  Does Clayton Jensen seem to have any problems with moodiness, appetite, weight loss, or sleep? no Any complaints by Clayton Jensen  about taking the medications? no When was he last examined for ADHD? 3 mo ago. Who is he seeing for his ADHD symptoms? Dr. Casimiro Jensen  When is his next appointment due? 3 mo.  Pt denies chest pain, headaches, palpations, anxiety, or insomnia. No excessive weight loss.  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   Outpatient Encounter Medications as of 08/11/2020  Medication Sig  . escitalopram (LEXAPRO) 20 MG tablet Take 1 tab p.o. daily.  Needs appt for more refills.  . methylphenidate (CONCERTA) 54 MG PO CR tablet Take one tablet po each morning  . methylphenidate (CONCERTA) 54 MG PO CR tablet Take 1 tablet (54 mg total) by mouth every morning.  . methylphenidate (CONCERTA) 54 MG PO CR tablet Take one tablet po each morning  . methylphenidate (RITALIN) 10 MG tablet Take one tablet po each day at 1:30 pm  . methylphenidate (RITALIN) 10 MG tablet Take one table po each day at 1:30 pm.  . methylphenidate (RITALIN) 10 MG tablet Take one table po each day at 1:30 pm  . [DISCONTINUED] escitalopram (LEXAPRO) 20 MG tablet Take 1 tab p.o. daily.  Needs appt for more refills.  . [DISCONTINUED] methylphenidate (CONCERTA) 54 MG PO CR tablet Take one tablet po each morning  . [  DISCONTINUED] methylphenidate (CONCERTA) 54 MG PO CR tablet Take 1 tablet (54 mg total) by mouth every morning.  . [DISCONTINUED] methylphenidate (CONCERTA) 54 MG PO CR tablet Take one tablet po each morning  . [DISCONTINUED] methylphenidate (RITALIN) 10 MG tablet Take one tablet po each day at 1:30 pm  . [DISCONTINUED] methylphenidate (RITALIN) 10 MG tablet Take one table po each day at 1:30 pm.  . [DISCONTINUED] methylphenidate (RITALIN) 10 MG tablet Take one table po each day at 1:30 pm   No  facility-administered encounter medications on file as of 08/11/2020.     Review of Systems  Constitutional: Negative for chills, fever and unexpected weight change.  HENT: Negative for congestion, rhinorrhea and sore throat.   Respiratory: Negative for cough, shortness of breath and wheezing.   Cardiovascular: Negative for chest pain, palpitations and leg swelling.  Gastrointestinal: Negative for abdominal pain, diarrhea, nausea and vomiting.  Genitourinary: Negative for dysuria and frequency.  Skin: Negative for rash.  Neurological: Negative for dizziness, weakness and headaches.  Psychiatric/Behavioral: Negative for agitation, behavioral problems, decreased concentration, dysphoric mood, self-injury, sleep disturbance and suicidal ideas. The patient is not nervous/anxious and is not hyperactive.      Vitals There were no vitals taken for this visit.  Objective:   Physical Exam No PE due to phone visit.  Assessment and Plan   1. Attention deficit disorder (ADD) without hyperactivity  2. Attention deficit hyperactivity disorder (ADHD), predominantly inattentive type - methylphenidate (CONCERTA) 54 MG PO CR tablet; Take one tablet po each morning  Dispense: 30 tablet; Refill: 0 - methylphenidate (CONCERTA) 54 MG PO CR tablet; Take 1 tablet (54 mg total) by mouth every morning.  Dispense: 30 tablet; Refill: 0 - methylphenidate (CONCERTA) 54 MG PO CR tablet; Take one tablet po each morning  Dispense: 30 tablet; Refill: 0 - methylphenidate (RITALIN) 10 MG tablet; Take one tablet po each day at 1:30 pm  Dispense: 30 tablet; Refill: 0 - methylphenidate (RITALIN) 10 MG tablet; Take one table po each day at 1:30 pm.  Dispense: 30 tablet; Refill: 0 - methylphenidate (RITALIN) 10 MG tablet; Take one table po each day at 1:30 pm  Dispense: 30 tablet; Refill: 0  3. Generalized anxiety disorder - escitalopram (LEXAPRO) 20 MG tablet; Take 1 tab p.o. daily.  Needs appt for more refills.   Dispense: 90 tablet; Refill: 0   Add- Pt doing well with concerta cr and ritalin. Controlling symptoms. Will be due for urine drug screen on next visit.  Gad- stable, cont with lexapro.  06/26/20- last fill concerta Cr 06/17/20- last fill ritalin 10mg . Reviewed pmp.  Return in about 3 months (around 11/11/2020) for f/u add.   Follow Up Instructions:    I discussed the assessment and treatment plan with the patient. The patient was provided an opportunity to ask questions and all were answered. The patient agreed with the plan and demonstrated an understanding of the instructions.   The patient was advised to call back or seek an in-person evaluation if the symptoms worsen or if the condition fails to improve as anticipated.  I provided 11 minutes of non-face-to-face time during this encounter.   Halen Antenucci 01/11/2021, DO

## 2020-08-11 NOTE — Telephone Encounter (Signed)
[name], Clayton Jensen are scheduled for a virtual visit with your provider today.    Just as we do with appointments in the office, we must obtain your consent to participate.  Your consent will be active for this visit and any virtual visit you may have with one of our providers in the next 365 days.    If you have a MyChart account, I can also send a copy of this consent to you electronically.  All virtual visits are billed to your insurance company just like a traditional visit in the office.  As this is a virtual visit, video technology does not allow for your provider to perform a traditional examination.  This may limit your provider's ability to fully assess your condition.  If your provider identifies any concerns that need to be evaluated in person or the need to arrange testing such as labs, EKG, etc, we will make arrangements to do so.    Although advances in technology are sophisticated, we cannot ensure that it will always work on either your end or our end.  If the connection with a video visit is poor, we may have to switch to a telephone visit.  With either a video or telephone visit, we are not always able to ensure that we have a secure connection.   I need to obtain your verbal consent now.   Are you willing to proceed with your visit today?   Clayton Jensen has provided verbal consent on 08/11/2020 for a virtual visit (video or telephone).

## 2020-08-13 ENCOUNTER — Other Ambulatory Visit: Payer: Self-pay

## 2020-08-13 DIAGNOSIS — F900 Attention-deficit hyperactivity disorder, predominantly inattentive type: Secondary | ICD-10-CM

## 2020-08-13 MED ORDER — METHYLPHENIDATE HCL ER (OSM) 54 MG PO TBCR: EXTENDED_RELEASE_TABLET | ORAL | 0 refills | Status: DC

## 2020-08-13 MED ORDER — METHYLPHENIDATE HCL ER (OSM) 54 MG PO TBCR: 54.0000 mg | EXTENDED_RELEASE_TABLET | ORAL | 0 refills | Status: DC

## 2020-08-13 MED ORDER — METHYLPHENIDATE HCL 10 MG PO TABS: ORAL_TABLET | ORAL | 0 refills | Status: DC

## 2020-08-13 NOTE — Telephone Encounter (Signed)
Please advise. Thank you

## 2020-08-13 NOTE — Telephone Encounter (Signed)
Pt has been out of med for a few days when Dr Ladona Ridgel sent request she set it up for the 15th methylphenidate (CONCERTA) 54 MG PO    Pt call back [phone]

## 2020-08-13 NOTE — Telephone Encounter (Signed)
Yes, pls order both medications to allow pt to pick up today, since it looks like he skipped April's refills.   Thx.   Dr. Ladona Ridgel

## 2020-11-04 ENCOUNTER — Ambulatory Visit
Admission: EM | Admit: 2020-11-04 | Discharge: 2020-11-04 | Disposition: A | Discharge status: Home / Self Care | Payer: 59 | Attending: Family Medicine | Admitting: Family Medicine

## 2020-11-04 ENCOUNTER — Encounter: Payer: Self-pay | Admitting: Emergency Medicine

## 2020-11-04 ENCOUNTER — Other Ambulatory Visit: Payer: Self-pay

## 2020-11-04 DIAGNOSIS — L02415 Cutaneous abscess of right lower limb: Secondary | ICD-10-CM | POA: Diagnosis not present

## 2020-11-04 MED ORDER — DOXYCYCLINE HYCLATE 100 MG PO CAPS: 100.0000 mg | ORAL_CAPSULE | Freq: Two times a day (BID) | ORAL | 0 refills | Status: DC

## 2020-11-04 NOTE — ED Triage Notes (Signed)
Bump on right leg since Saturday.  Area feels warm to the touch.  Patient states area is getting bigger since Saturday.

## 2020-11-04 NOTE — ED Provider Notes (Signed)
[facility]    CSN: [id number] Arrival date & time: 11/04/20  1233      History   Chief Complaint No chief complaint on file.   HPI Clayton Jensen is a 40 y.o. male.   HPI Patient presents today with a concern of a erythematous indurated area involving the right upper thigh.  He denies any injury or to his knowledge been bitten by any type of an insect.  The indurated area on the thigh has been present for 3 days.  It has gradually increased in diameter.  Denies any fever.  Denies nausea vomiting.  No history of abscess.  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   OSA (obstructive sleep apnea)    clinical diagnosis, not officially diagnosed per family   PNA (pneumonia) 08/29/2017   Sepsis (HCC) 08/29/2017    Patient Active Problem List   Diagnosis Date Noted   ADD (attention deficit disorder) 03/27/2020   Poor dentition    History of infection due to Streptococcus pneumoniae    History of empyema of pleura 08/30/2017   Chronic lower back pain    Tremor, essential 04/08/2017   History of encephalopathy    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 Authorizing Provider  doxycycline (VIBRAMYCIN) 100 MG capsule Take 1 capsule (100 mg total) by mouth 2 (two) times daily. 11/04/20  Yes Bing Neighbors, FNP  escitalopram (LEXAPRO) 20 MG tablet Take 1 tab p.o. daily.   Needs appt for more refills. 08/11/20   Annalee Genta, DO  methylphenidate (CONCERTA) 54 MG PO CR tablet Take one tablet po each morning 08/11/20   Laroy Apple M, DO  methylphenidate (CONCERTA) 54 MG PO CR tablet Take one tablet po each morning 08/13/20   Laroy Apple M, DO  methylphenidate (CONCERTA) 54 MG PO CR tablet Take 1 tablet (54 mg total) by mouth every morning. 08/13/20   Laroy Apple M, DO  methylphenidate (RITALIN) 10 MG tablet Take one table po each day at 1:30 pm 08/11/20   Laroy Apple M, DO  methylphenidate (RITALIN) 10 MG tablet Take one tablet po each day at 1:30 pm 08/13/20   Laroy Apple M, DO  methylphenidate (RITALIN) 10 MG tablet Take one table po each day at 1:30 pm. 08/13/20   Annalee Genta, DO    Family History Family History  Problem Relation Age of Onset   COPD Mother    Atrial fibrillation Mother    Diabetes Mother    Diabetes Father     Social History Social History   Tobacco Use   Smoking status: Light Smoker    Packs/day: 0.75    Years: 23.00    Pack years: 17.25    Types: Cigarettes, Cigars   Smokeless tobacco: Never  Vaping Use   Vaping Use: Former  Substance Use Topics   Alcohol use: Yes    Comment: 04/05/2017 "might have a beer q 2 months"   Drug use: No     Allergies   Celery oil   Review of Systems Review of Systems Pertinent negatives listed in HPI   Physical Exam Triage Vital Signs ED Triage Vitals  Enc Vitals Group     BP 11/04/20 1240 134/81     Pulse Rate 11/04/20 1240 74     Resp 11/04/20 1240 18     Temp 11/04/20 1240 98.2 F (36.8 C)     Temp Source 11/04/20 1240 Oral     SpO2 11/04/20 1240 96 %     Weight --      Height --      Head Circumference --      Peak Flow --      Pain Score 11/04/20 1242 5     Pain Loc --      Pain Edu? --      Excl. in GC? --    No data found.  Updated Vital Signs BP 134/81 (BP Location: Right Arm)   Pulse 74   Temp 98.2 F (36.8 C) (Oral)   Resp 18   SpO2 96%    Visual Acuity Right Eye Distance:   Left Eye Distance:   Bilateral Distance:    Right Eye Near:   Left Eye Near:    Bilateral Near:     Physical Exam Constitutional:      Appearance: Normal appearance. He is obese.  HENT:     Head: Normocephalic.  Cardiovascular:     Rate and Rhythm: Normal rate and regular rhythm.  Pulmonary:     Effort: Pulmonary effort is normal.     Breath sounds: Normal breath sounds.  Musculoskeletal:       Legs:  Neurological:     Mental Status: He is alert.  Psychiatric:        Attention and Perception: Attention normal.        Mood and Affect: Mood normal.        Speech: Speech normal.     UC Treatments / Results  Labs (all labs ordered are listed, but only abnormal results are displayed) Labs Reviewed - No data to display  EKG   Radiology No results found.  Procedures Procedures (including critical care time)  Medications Ordered in UC Medications - No data to display  Initial Impression / Assessment and Plan / UC Course  I have reviewed the triage vital signs and the nursing notes.  Pertinent labs & imaging results that were available during my care of the patient were reviewed by me and considered in my medical decision making (see chart for details).    Given appearance of right thigh we will treat for an evolving abscess of the right upper thigh.  Treating with doxycycline.  Advised if.  Does not completely resolve or worsens either follow-up with PCP or return here for evaluation.  Encouraged warm compresses.  If any pain develops Tylenol or ibuprofen.  Avoid any tight fitting clothing to avoid further irritating the area.  Patient verbalized understanding agreement with plan. Final Clinical Impressions(s) / UC Diagnoses   Final diagnoses:  Abscess of right thigh   Discharge Instructions   None    ED Prescriptions     Medication Sig Dispense Auth. Provider   doxycycline (VIBRAMYCIN) 100 MG capsule Take 1 capsule  (100 mg total) by mouth 2 (two) times daily. 20  capsule Bing Neighbors, FNP      PDMP not reviewed this encounter.   Bing Neighbors, [place] 11/11/20 [phone]

## 2020-11-17 ENCOUNTER — Ambulatory Visit: Payer: Self-pay | Admitting: Family Medicine

## 2020-12-02 ENCOUNTER — Encounter: Payer: Self-pay | Admitting: Family Medicine

## 2020-12-02 ENCOUNTER — Other Ambulatory Visit: Payer: Self-pay

## 2020-12-02 ENCOUNTER — Telehealth: Payer: Self-pay

## 2020-12-02 ENCOUNTER — Ambulatory Visit: Payer: Self-pay | Admitting: Family Medicine

## 2020-12-02 ENCOUNTER — Ambulatory Visit (INDEPENDENT_AMBULATORY_CARE_PROVIDER_SITE_OTHER): Payer: Self-pay | Admitting: Family Medicine

## 2020-12-02 VITALS — Ht 72.0 in | Wt 396.0 lb

## 2020-12-02 DIAGNOSIS — R0683 Snoring: Secondary | ICD-10-CM

## 2020-12-02 DIAGNOSIS — R400 Somnolence: Secondary | ICD-10-CM

## 2020-12-02 DIAGNOSIS — F900 Attention-deficit hyperactivity disorder, predominantly inattentive type: Secondary | ICD-10-CM

## 2020-12-02 DIAGNOSIS — Z9189 Other specified personal risk factors, not elsewhere classified: Secondary | ICD-10-CM

## 2020-12-02 DIAGNOSIS — F411 Generalized anxiety disorder: Secondary | ICD-10-CM

## 2020-12-02 DIAGNOSIS — F988 Other specified behavioral and emotional disorders with onset usually occurring in childhood and adolescence: Secondary | ICD-10-CM

## 2020-12-02 MED ORDER — ESCITALOPRAM OXALATE 20 MG PO TABS: ORAL_TABLET | ORAL | 1 refills | Status: DC

## 2020-12-02 MED ORDER — METHYLPHENIDATE HCL 10 MG PO TABS: ORAL_TABLET | ORAL | 0 refills | Status: DC

## 2020-12-02 MED ORDER — METHYLPHENIDATE HCL ER (OSM) 54 MG PO TBCR: EXTENDED_RELEASE_TABLET | ORAL | 0 refills | Status: DC

## 2020-12-02 MED ORDER — METHYLPHENIDATE HCL ER (OSM) 54 MG PO TBCR: 54.0000 mg | EXTENDED_RELEASE_TABLET | ORAL | 0 refills | Status: DC

## 2020-12-02 NOTE — Telephone Encounter (Signed)
I connected with  Clayton Jensen on 12/02/20 by a video enabled telemedicine application and verified that I am speaking with the correct person using two identifiers.   I discussed the limitations of evaluation and management by telemedicine. The patient expressed understanding and agreed to proceed.

## 2020-12-02 NOTE — Progress Notes (Signed)
I connected with  Arlyce Harman on 12/02/20 by a phone enabled telemedicine application and verified that I am speaking with the correct person using two identifiers.   I discussed the limitations of evaluation and management by telemedicine. The patient expressed understanding and agreed to proceed.  Patient location: home  Provider location: in office  I provided 15 minutes of non face - to - face time during this encounter.   Patient ID: Clayton Jensen, male    DOB: 14-Apr-1980, 40 y.o.   MRN: [id number]   Chief Complaint  Patient presents with   adult ADHD    Follow up    Subjective:    HPI  This patient has adult ADD, this was found after having encephalopathy and meningitis in 2019.  Per neuro was recommended pt take medication for add.  Pt had seeing psychiatry in the past, about 2-3 yrs. Ago.  Pt reports that after viral meningitis had some memory loss and had add /concentration issues and anxiety and they started him on concerta, ritalin and lexapro.  Takes medication responsibly. Medication does help the patient focus in be more functional. Patient relates that they are or not abusing the medication or misusing the medication. The patient understands that if they're having any negative side effects such as elevated high blood pressure severe headaches they would need stop the medication follow-up immediately. They also understand that the prescriptions are to last for 3 months then the patient will need to follow-up before having further prescriptions.  Patient compliance daily with concerta.  Taking ritalin 4-5x per week.  Does medication help patient function /attention better  yes  Side effects-  none  Pt having a concern of sleep apnea. Has had long standing snoring and not feeling rested in am. Falling sleep easily during the day. Not having headache in morning. Having some apnea. No h/o sleep study.  Pt taking the concerta and lexapro every day. If needed then  taking ritalin- taking it 4-5 times per week.  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   Outpatient Encounter Medications as of 12/02/2020  Medication Sig   amoxicillin-clavulanate (AUGMENTIN) 875-125 MG tablet Take 1 tablet by mouth 2 (two) times daily.   [DISCONTINUED] escitalopram (LEXAPRO) 20 MG tablet Take 1 tab p.o. daily.  Needs appt for more refills.   [DISCONTINUED] methylphenidate (CONCERTA) 54 MG PO CR tablet Take one tablet po each morning   [DISCONTINUED] methylphenidate (CONCERTA) 54 MG PO CR tablet Take one tablet po each morning   [DISCONTINUED] methylphenidate (CONCERTA) 54 MG PO CR tablet Take 1 tablet (54 mg total) by mouth every morning.   [DISCONTINUED] methylphenidate (RITALIN) 10 MG tablet Take one table po each day at 1:30 pm   [DISCONTINUED] methylphenidate (RITALIN) 10 MG tablet Take one tablet po each day at 1:30 pm   [DISCONTINUED] methylphenidate (RITALIN) 10 MG tablet Take one table po each day at 1:30 pm.   escitalopram (LEXAPRO) 20 MG tablet Take 1 tab p.o. daily.   methylphenidate (CONCERTA) 54 MG PO CR tablet Take one tablet po each morning   methylphenidate (CONCERTA) 54 MG PO CR tablet Take 1 tablet (54 mg total) by mouth every morning.   methylphenidate (CONCERTA) 54 MG PO CR tablet Take one tablet po each morning   methylphenidate (RITALIN) 10 MG tablet  Take one table po each day at 1:30 pm   methylphenidate (RITALIN) 10 MG tablet Take one tablet po each day at 1:30 pm   methylphenidate (RITALIN) 10 MG tablet Take one table po each day at 1:30 pm.   [DISCONTINUED] doxycycline (VIBRAMYCIN) 100 MG capsule Take 1 capsule (100 mg total) by mouth 2 (two) times daily.   No facility-administered encounter medications on file as  of 12/02/2020.     Review of Systems  Constitutional:  Negative for chills and fever.  HENT:  Negative for congestion, rhinorrhea and sore throat.   Respiratory:  Negative for cough, shortness of breath and wheezing.   Cardiovascular:  Negative for chest pain and leg swelling.  Gastrointestinal:  Negative for abdominal pain, diarrhea, nausea and vomiting.  Genitourinary:  Negative for dysuria and frequency.  Skin:  Negative for rash.  Neurological:  Negative for dizziness, weakness and headaches.  Psychiatric/Behavioral:  Negative for behavioral problems, decreased concentration, dysphoric mood, self-injury, sleep disturbance and suicidal ideas. The patient is not nervous/anxious and is not hyperactive.     Vitals Ht 6' (1.829 m)   Wt (!) 396 lb (179.6 kg)   BMI 53.71 kg/m   Objective:   Physical Exam No PE due to phone visit. Nad, psyche- normal mood, judgement, and insight.  Assessment and Plan   1. Attention deficit disorder (ADD) without hyperactivity  2. Generalized anxiety disorder - escitalopram (LEXAPRO) 20 MG tablet; Take 1 tab p.o. daily.  Dispense: 90 tablet; Refill: 1  3. Attention deficit hyperactivity disorder (ADHD), predominantly inattentive type - methylphenidate (CONCERTA) 54 MG PO CR tablet; Take one tablet po each morning  Dispense: 30 tablet; Refill: 0 - methylphenidate (CONCERTA) 54 MG PO CR tablet; Take 1 tablet (54 mg total) by mouth every morning.  Dispense: 30 tablet; Refill: 0 - methylphenidate (RITALIN) 10 MG tablet; Take one table po each day at 1:30 pm  Dispense: 30 tablet; Refill: 0 - methylphenidate (RITALIN) 10 MG tablet; Take one tablet po each day at 1:30 pm  Dispense: 30 tablet; Refill: 0 - methylphenidate (RITALIN) 10 MG tablet; Take one table po each day at 1:30 pm.  Dispense: 30 tablet; Refill: 0 - methylphenidate (CONCERTA) 54 MG PO CR tablet; Take one tablet po each morning  Dispense: 30 tablet; Refill: 0  4. At risk for apnea - Home  sleep test  5. Morbid obesity (HCC) - Home sleep test  6. Snoring - Home sleep test  7. Daytime somnolence - Home sleep test   [facility] scales [address].  Adhd- stable. Controlled.  Not having side effects Pmp reviewed.  Last fill 11/25/20. Pt doing well with meds.  Not having side effects.   Pt requesting sleep study. -order given.   Return in about 3 months (around 03/04/2021) for f/u adhd.

## 2020-12-06 ENCOUNTER — Encounter: Payer: Self-pay | Admitting: Emergency Medicine

## 2020-12-06 ENCOUNTER — Ambulatory Visit
Admission: EM | Admit: 2020-12-06 | Discharge: 2020-12-06 | Disposition: A | Discharge status: Home / Self Care | Payer: 59 | Attending: Internal Medicine | Admitting: Internal Medicine

## 2020-12-06 ENCOUNTER — Other Ambulatory Visit: Payer: Self-pay

## 2020-12-06 DIAGNOSIS — H6692 Otitis media, unspecified, left ear: Secondary | ICD-10-CM | POA: Diagnosis not present

## 2020-12-06 MED ORDER — CEFDINIR 300 MG PO CAPS: 300.0000 mg | ORAL_CAPSULE | Freq: Two times a day (BID) | ORAL | 0 refills | Status: AC

## 2020-12-06 MED ORDER — IBUPROFEN 600 MG PO TABS: 600.0000 mg | ORAL_TABLET | Freq: Four times a day (QID) | ORAL | 0 refills | Status: DC | PRN

## 2020-12-06 MED ORDER — NEOMYCIN-POLYMYXIN-HC 3.5-10000-1 OT SUSP: 4.0000 [drp] | Freq: Three times a day (TID) | OTIC | 0 refills | Status: DC

## 2020-12-06 NOTE — ED Triage Notes (Signed)
Patient c/o LFT ear pain x 1 week.   Patient endorses sound sensitivity. Patient endorses dizziness and headache.   Patient endorses onset of symptoms began with "a cold and then I was diagnosed with a bacterial infection".   Patient recently had 6 teeth removed " and I'm not sure if the ear pain started before and after".   Patient was given Augmentin and is currently taking this.

## 2020-12-06 NOTE — Discharge Instructions (Addendum)
Please take medications as prescribed °If you have worsening symptoms please return to urgent care to be reevaluated. ° °

## 2020-12-06 NOTE — ED Provider Notes (Signed)
[facility]    CSN: [id number] Arrival date & time: 12/06/20  1205      History   Chief Complaint Chief Complaint  Patient presents with   Otalgia    HPI Clayton Jensen is a 40 y.o. male comes to the urgent care with 1 week of left hip pain.  Patient's symptoms started a week ago and has been worsening.  It is associated with some ringing in the ears and dizziness.  He denies any fever or chills.  Patient was started on Augmentin several days ago for "bacterial infection".  He has completed a 7 out of 10-day course of antibiotics.  Patient had been treated with clindamycin prior to that for "pneumonia".  Patient has some dizziness but denies any vertigo.  Patient has mild headache.  No nausea, vomiting or diarrhea.  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   OSA (obstructive sleep apnea)    clinical diagnosis, not officially diagnosed per family   PNA (pneumonia) 08/29/2017   Sepsis (HCC) 08/29/2017    Patient Active Problem List   Diagnosis Date Noted   ADD (attention deficit disorder) 03/27/2020   Poor dentition    History of infection due to Streptococcus pneumoniae    History of empyema of pleura 08/30/2017   Chronic lower back pain    Tremor, essential 04/08/2017   History of encephalopathy    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 Authorizing Provider   cefdinir (OMNICEF) 300 MG capsule Take 1 capsule (300 mg total) by mouth 2 (two) times daily for 10 days. 12/06/20 12/16/20 Yes Aries Townley, Britta Mccreedy, MD  escitalopram (LEXAPRO) 20 MG tablet Take 1 tab p.o. daily. 12/02/20  Yes Ladona Ridgel, Malena M, DO  ibuprofen (ADVIL) 600 MG tablet Take 1 tablet (600 mg total) by mouth every 6 (six) hours as needed. 12/06/20  Yes Helena Sardo, Britta Mccreedy, MD  methylphenidate (CONCERTA) 54 MG PO CR tablet Take one tablet po each morning 12/02/20  Yes Ladona Ridgel, Malena M, DO  methylphenidate (RITALIN) 10 MG tablet Take one table po each day at 1:30 pm 12/02/20  Yes Ladona Ridgel, Malena M, DO  neomycin-polymyxin-hydrocortisone (CORTISPORIN) 3.5-10000-1 OTIC suspension Place 4 drops into the left ear 3 (three) times daily. 12/06/20  Yes Margreat Widener, Britta Mccreedy, MD  methylphenidate (CONCERTA) 54 MG PO CR tablet Take 1 tablet (54 mg total) by mouth every morning. 12/02/20   Annalee Genta, DO  methylphenidate (CONCERTA) 54 MG PO CR tablet Take one tablet po each morning 12/02/20   Laroy Apple M, DO  methylphenidate (RITALIN) 10 MG tablet Take one tablet po each day at 1:30 pm 12/02/20   Laroy Apple M, DO  methylphenidate (RITALIN) 10 MG tablet Take one table po each day at 1:30 pm. 12/02/20   Annalee Genta, DO  Family History Family History  Problem Relation Age of Onset   COPD Mother    Atrial fibrillation Mother    Diabetes Mother    Diabetes Father     Social History Social History   Tobacco Use   Smoking status: Light Smoker    Packs/day: 0.75    Years: 23.00    Pack years: 17.25    Types: Cigarettes, Cigars   Smokeless tobacco: Never  Vaping Use   Vaping Use: Former  Substance Use Topics   Alcohol use: Yes    Comment: 04/05/2017 "might have a beer q 2 months"   Drug use: No     Allergies   Celery oil   Review of Systems Review of Systems  Constitutional:  Negative for chills and fever.  HENT:  Positive for ear pain. Negative for congestion, ear discharge and sore  throat.   Eyes: Negative.   Respiratory: Negative.    Neurological:  Positive for headaches.    Physical Exam Triage Vital Signs ED Triage Vitals  Enc Vitals Group     BP 12/06/20 1222 123/80     Pulse Rate 12/06/20 1222 75     Resp 12/06/20 1222 18     Temp 12/06/20 1222 98.3 F (36.8 C)     Temp Source 12/06/20 1222 Oral     SpO2 12/06/20 1222 93 %     Weight --      Height --      Head Circumference --      Peak Flow --      Pain Score 12/06/20 1220 7     Pain Loc --      Pain Edu? --      Excl. in GC? --    No data found.  Updated Vital Signs BP 123/80 (BP Location: Right Arm)   Pulse 75   Temp 98.3 F (36.8 C) (Oral)   Resp 18   SpO2 93%   Visual Acuity Right Eye Distance:   Left Eye Distance:   Bilateral Distance:    Right Eye Near:   Left Eye Near:    Bilateral Near:     Physical Exam Vitals and nursing note reviewed.  Constitutional:      General: He is in acute distress.     Appearance: Normal appearance. He is not ill-appearing.  HENT:     Ears:     Comments: Erythematous left tympanic membrane.  Middle ear effusion. Cardiovascular:     Rate and Rhythm: Normal rate and regular rhythm.  Pulmonary:     Effort: Pulmonary effort is normal.     Breath sounds: Normal breath sounds.  Neurological:     Mental Status: He is alert.     UC Treatments / Results  Labs (all labs ordered are listed, but only abnormal results are displayed) Labs Reviewed - No data to display  EKG   Radiology No results found.  Procedures Procedures (including critical care time)  Medications Ordered in UC Medications - No data to display  Initial Impression / Assessment and Plan / UC Course  I have reviewed the triage vital signs and the nursing notes.  Pertinent labs & imaging results that were available during my care of the patient were reviewed by me and considered in my medical decision making (see chart for details).     1.  Left otitis  media: Failed treatment with Augmentin Discontinue Augmentin Start cefdinir 300 mg twice daily for 10 days Ibuprofen as needed for  pain Return precautions given. Cortisporin otic suspension. Final Clinical Impressions(s) / UC Diagnoses   Final diagnoses:  Acute left otitis media     Discharge Instructions      Please take medications as prescribed If you have worsening symptoms please return to urgent care to be reevaluated.   ED Prescriptions     Medication Sig Dispense Auth. Provider   cefdinir (OMNICEF) 300 MG capsule Take 1 capsule (300 mg total) by mouth 2 (two) times daily for 10 days. 20 capsule Yee Joss, Britta Mccreedy, MD   ibuprofen (ADVIL) 600 MG tablet Take 1 tablet (600 mg total) by mouth every 6 (six) hours as needed. 30 tablet La Dibella, Britta Mccreedy, MD   neomycin-polymyxin-hydrocortisone (CORTISPORIN) 3.5-10000-1 OTIC suspension Place 4 drops into the left ear 3 (three) times daily. 10 mL Edwards Mckelvie, Britta Mccreedy, MD      PDMP not reviewed this encounter.   Merrilee Jansky, MD 12/06/20 1346

## 2020-12-09 ENCOUNTER — Ambulatory Visit
Admission: EM | Admit: 2020-12-09 | Discharge: 2020-12-09 | Disposition: A | Discharge status: Home / Self Care | Payer: 59 | Attending: Family Medicine | Admitting: Family Medicine

## 2020-12-09 ENCOUNTER — Other Ambulatory Visit: Payer: Self-pay

## 2020-12-09 ENCOUNTER — Encounter: Payer: Self-pay | Admitting: Emergency Medicine

## 2020-12-09 DIAGNOSIS — H9202 Otalgia, left ear: Secondary | ICD-10-CM

## 2020-12-09 MED ORDER — PREDNISONE 20 MG PO TABS: 40.0000 mg | ORAL_TABLET | Freq: Every day | ORAL | 0 refills | Status: DC

## 2020-12-09 NOTE — ED Triage Notes (Signed)
LT ear pain and headache.  Pt has been on 3 different types of abx with no relief.

## 2020-12-10 NOTE — ED Provider Notes (Signed)
[facility]   [id number] 12/09/20 Arrival Time: 1819  ASSESSMENT & PLAN:  1. Otalgia of left ear    Begin trial of: Meds ordered this encounter  Medications   predniSONE (DELTASONE) 20 MG tablet    Sig: Take 2 tablets (40 mg total) by mouth daily.    Dispense:  10 tablet    Refill:  0   Recommend:  Follow-up Information     Schedule an appointment as soon as possible for a visit  with [facility].   Contact information: [address] [place] [address] [phone]                  Reviewed expectations re: course of current medical issues. Questions answered. Outlined signs and symptoms indicating need for more acute intervention. Patient verbalized understanding. After Visit Summary given.   SUBJECTIVE: History from: patient. Clayton Jensen is a 40 y.o. male who presents with complaint of L otalgia; has been on multiple antibiotics recently. "Just wanted it checked; feel a pressure around ear". Afebrile. No ear drainage or bleeding.   Social History   Tobacco Use  Smoking Status Light Smoker   Packs/day: 0.75   Years: 23.00   Pack years: 17.25   Types: Cigarettes, Cigars  Smokeless Tobacco Never     OBJECTIVE:  Vitals:   12/09/20 1940  BP: (!) 148/85  Pulse: 99  Resp: 19  Temp: 98.7 F (37.1 C)  TempSrc: Oral  SpO2: 95%     General appearance: alert; NAD Ear Canal: normal TM: left: with clear fluid and bulging Neck: supple without LAD Lungs: unlabored respirations, symmetrical air entry; cough: absent; no respiratory distress Skin: warm and dry Psychological: alert and cooperative; normal mood and affect  Allergies  Allergen Reactions   Celery Oil Shortness Of Breath and Swelling    Swelling of throat    Past Medical History:  Diagnosis Date   Altered mental state 04/04/2017   Ataxia    Chronic lower back pain    "fell off roof in ~ 2003; disc fused itself"    History of kidney stones    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   OSA (obstructive sleep apnea)    clinical diagnosis, not officially diagnosed per family   PNA (pneumonia) 08/29/2017   Sepsis (HCC) 08/29/2017   Family History  Problem Relation Age of Onset   COPD Mother    Atrial fibrillation Mother    Diabetes Mother    Diabetes Father    Social History   Socioeconomic History   Marital status: Married    Spouse name: Not on file   Number of children: Not on file   Years of education: Not on file   Highest education level: Not on file  Occupational History   Not on file  Tobacco Use   Smoking status: Light Smoker    Packs/day: 0.75    Years: 23.00    Pack years: 17.25    Types: Cigarettes, Cigars   Smokeless tobacco: Never  Vaping Use   Vaping Use: Former  Substance and Sexual Activity   Alcohol use: Yes    Comment: 04/05/2017 "might have a beer q 2 months"   Drug use: No   Sexual activity: Yes  Other Topics Concern   Not on file  Social History Narrative   Not on file   Social Determinants of Health   Financial Resource Strain: Not on file  Food Insecurity: Not on  file  Transportation Needs: Not on file  Physical Activity: Not on file  Stress: Not on file  Social Connections: Not on file  Intimate Partner Violence: Not on file             Mardella Layman, MD 12/10/20 1238

## 2020-12-26 ENCOUNTER — Emergency Department (HOSPITAL_COMMUNITY)
Admission: EM | Admit: 2020-12-26 | Discharge: 2020-12-26 | Disposition: A | Discharge status: Home / Self Care | Payer: 59 | Attending: Emergency Medicine | Admitting: Emergency Medicine

## 2020-12-26 ENCOUNTER — Emergency Department (HOSPITAL_COMMUNITY): Payer: 59

## 2020-12-26 ENCOUNTER — Ambulatory Visit
Admission: EM | Admit: 2020-12-26 | Discharge: 2020-12-26 | Disposition: A | Discharge status: Home / Self Care | Payer: 59

## 2020-12-26 ENCOUNTER — Other Ambulatory Visit: Payer: Self-pay

## 2020-12-26 ENCOUNTER — Encounter (HOSPITAL_COMMUNITY): Payer: Self-pay

## 2020-12-26 DIAGNOSIS — R519 Headache, unspecified: Secondary | ICD-10-CM

## 2020-12-26 DIAGNOSIS — F17210 Nicotine dependence, cigarettes, uncomplicated: Secondary | ICD-10-CM | POA: Diagnosis not present

## 2020-12-26 DIAGNOSIS — H6591 Unspecified nonsuppurative otitis media, right ear: Secondary | ICD-10-CM

## 2020-12-26 DIAGNOSIS — H6692 Otitis media, unspecified, left ear: Secondary | ICD-10-CM | POA: Diagnosis not present

## 2020-12-26 DIAGNOSIS — H66004 Acute suppurative otitis media without spontaneous rupture of ear drum, recurrent, right ear: Secondary | ICD-10-CM

## 2020-12-26 MED ORDER — LEVOFLOXACIN 500 MG PO TABS: 500.0000 mg | ORAL_TABLET | Freq: Every day | ORAL | 0 refills | Status: DC

## 2020-12-26 MED ORDER — KETOROLAC TROMETHAMINE 30 MG/ML IJ SOLN
30.0000 mg | Freq: Once | INTRAMUSCULAR | Status: AC
  Administered 2020-12-26: 30 mg via INTRAVENOUS
  Filled 2020-12-26: qty 1

## 2020-12-26 NOTE — Discharge Instructions (Signed)
Follow-up with Dr. Jenne Pane or one of his partners next week

## 2020-12-26 NOTE — ED Provider Notes (Addendum)
[facility] EMERGENCY DEPARTMENT Provider Note   CSN: [id number] Arrival date & time: 12/26/20  1429     History Chief Complaint  Patient presents with   Headache    Clayton Jensen is a 40 y.o. male.  Patient complains of a headache.  He has had chronic left ear infection for over a month.  He is taken Augmentin and then cefdinir without help.  The history is provided by the patient and medical records. No language interpreter was used.  Headache Pain location:  Generalized Quality:  Dull Radiates to:  Does not radiate Severity currently:  3/10 Severity at highest:  6/10 Onset quality:  Sudden Timing:  Constant Progression:  Worsening Chronicity:  New Similar to prior headaches: no   Context: not activity   Associated symptoms: no abdominal pain, no back pain, no congestion, no cough, no diarrhea, no fatigue, no seizures and no sinus pressure       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   OSA (obstructive sleep apnea)    clinical diagnosis, not officially diagnosed per family   PNA (pneumonia) 08/29/2017   Sepsis (HCC) 08/29/2017    Patient Active Problem List   Diagnosis Date Noted   ADD (attention deficit disorder) 03/27/2020   Poor dentition    History of infection due to Streptococcus pneumoniae    History of empyema of pleura 08/30/2017   Chronic lower back pain    Tremor, essential 04/08/2017   History of encephalopathy    History of viral meningitis 04/06/2017   Abnormal liver function     Past Surgical History:  Procedure Laterality Date   KNEE ARTHROSCOPY Left    VIDEO ASSISTED THORACOSCOPY (VATS)/EMPYEMA Right 08/30/2017   Procedure: VIDEO ASSISTED THORACOSCOPY (VATS)/EMPYEMA;  Surgeon: Alleen Borne, MD;  Location: [facility] OR;  Service: Thoracic;  Laterality: Right;        Family History  Problem Relation Age of Onset   COPD Mother    Atrial fibrillation Mother    Diabetes Mother    Diabetes Father     Social History   Tobacco Use   Smoking status: Light Smoker    Packs/day: 0.75    Years: 23.00    Pack years: 17.25    Types: Cigarettes, Cigars   Smokeless tobacco: Never  Vaping Use   Vaping Use: Former  Substance Use Topics   Alcohol use: Yes    Comment: 04/05/2017 "might have a beer q 2 months"   Drug use: No    Home Medications Prior to Admission medications   Medication Sig Start Date End Date Taking? Authorizing Provider  escitalopram (LEXAPRO) 20 MG tablet Take 1 tab p.o. daily. 12/02/20  Yes Ladona Ridgel, Malena M, DO  ibuprofen (ADVIL) 600 MG tablet Take 1 tablet (600 mg total) by mouth every 6 (six) hours as needed. 12/06/20  Yes Lamptey, Britta Mccreedy, MD  levofloxacin (LEVAQUIN) 500 MG tablet Take 1 tablet (500 mg total) by mouth daily. 12/26/20  Yes Bethann Berkshire, MD  methylphenidate (CONCERTA) 54 MG PO CR tablet Take one tablet po each morning 12/02/20  Yes Ladona Ridgel, Malena M, DO  methylphenidate (RITALIN) 10 MG tablet Take one table po each day at 1:30 pm 12/02/20  Yes Ladona Ridgel, Malena M, DO  neomycin-polymyxin-hydrocortisone (CORTISPORIN)  3.5-10000-1 OTIC suspension Place 4 drops into the left ear 3 (three) times daily. 12/06/20  Yes Lamptey, Britta Mccreedy, MD  methylphenidate (CONCERTA) 54 MG PO CR tablet Take 1 tablet (54 mg total) by mouth every morning. Patient not taking: Reported on 12/26/2020 12/02/20   Annalee Genta, DO  methylphenidate (CONCERTA) 54 MG PO CR tablet Take one tablet po each morning Patient not taking: Reported on 12/26/2020 12/02/20   Laroy Apple M, DO  methylphenidate (RITALIN) 10 MG tablet Take one tablet po each day at 1:30 pm Patient not taking: Reported on 12/26/2020 12/02/20   Annalee Genta, DO  methylphenidate (RITALIN) 10 MG tablet Take one table po each day at 1:30 pm. Patient not taking: Reported on 12/26/2020  12/02/20   Laroy Apple M, DO  predniSONE (DELTASONE) 20 MG tablet Take 2 tablets (40 mg total) by mouth daily. Patient not taking: Reported on 12/26/2020 12/09/20   Mardella Layman, MD    Allergies    Celery oil  Review of Systems   Review of Systems  Constitutional:  Negative for appetite change and fatigue.  HENT:  Negative for congestion, ear discharge and sinus pressure.        Bilateral earache  Eyes:  Negative for discharge.  Respiratory:  Negative for cough.   Cardiovascular:  Negative for chest pain.  Gastrointestinal:  Negative for abdominal pain and diarrhea.  Genitourinary:  Negative for frequency and hematuria.  Musculoskeletal:  Negative for back pain.  Skin:  Negative for rash.  Neurological:  Positive for headaches. Negative for seizures.  Psychiatric/Behavioral:  Negative for hallucinations.    Physical Exam Updated Vital Signs BP (!) 99/44   Pulse 66   Temp 98.6 F (37 C) (Oral)   Resp 17   Ht 6' (1.829 m)   Wt (!) 179.6 kg   SpO2 96%   BMI 53.71 kg/m   Physical Exam Vitals and nursing note reviewed.  Constitutional:      Appearance: He is well-developed.  HENT:     Head: Normocephalic.     Comments: Right otitis media Eyes:     General: No scleral icterus.    Conjunctiva/sclera: Conjunctivae normal.  Neck:     Thyroid: No thyromegaly.  Cardiovascular:     Rate and Rhythm: Normal rate and regular rhythm.     Heart sounds: No murmur heard.   No friction rub. No gallop.  Pulmonary:     Breath sounds: No stridor. No wheezing or rales.  Chest:     Chest wall: No tenderness.  Abdominal:     General: There is no distension.     Tenderness: There is no abdominal tenderness. There is no rebound.  Musculoskeletal:        General: Normal range of motion.     Cervical back: Neck supple.  Lymphadenopathy:     Cervical: No cervical adenopathy.  Skin:    Findings: No erythema or rash.  Neurological:     Mental Status: He is oriented to person, place,  and time.     Motor: No abnormal muscle tone.     Coordination: Coordination normal.  Psychiatric:        Behavior: Behavior normal.    ED Results / Procedures / Treatments   Labs (all labs ordered are listed, but only abnormal results are displayed) Labs Reviewed - No data to display  EKG None  Radiology CT HEAD WO CONTRAST ( )  Result Date: 12/26/2020 CLINICAL DATA:  Headache for 1 month,  ear pain. EXAM: CT HEAD WITHOUT CONTRAST TECHNIQUE: Contiguous axial images were obtained from the base of the skull through the vertex without intravenous contrast. COMPARISON:  04/04/2017 FINDINGS: Brain: No evidence of acute infarction, hemorrhage, hydrocephalus, extra-axial collection or mass lesion/mass effect. Vascular: No hyperdense vessel or unexpected calcification. Skull: Normal. Negative for fracture or focal lesion. Sinuses/Orbits: No acute finding. Other: None. IMPRESSION: No acute intracranial findings. Electronically Signed   By: Duanne Guess D.O.   On: 12/26/2020 19:59    Procedures Procedures   Medications Ordered in ED Medications  ketorolac (TORADOL) 30 MG/ML injection 30 mg (30 mg Intravenous Given 12/26/20 1856)    ED Course  I have reviewed the triage vital signs and the nursing notes.  Pertinent labs & imaging results that were available during my care of the patient were reviewed by me and considered in my medical decision making (see chart for details).    MDM Rules/Calculators/A&P                           Otitis media refractory to Augmentin and cefdinir.  He will be started on Levaquin and will follow up with ENT Final Clinical Impression(s) / ED Diagnoses Final diagnoses:  Right otitis media with effusion    Rx / DC Orders ED Discharge Orders          Ordered    levofloxacin (LEVAQUIN) 500 MG tablet  Daily        12/26/20 2213             Bethann Berkshire, MD 12/29/20 1057    Bethann Berkshire, MD 12/29/20 1059

## 2020-12-26 NOTE — ED Triage Notes (Signed)
Pt presents to ED with complaints of headache x 1 month, ear pain, treated multiple times but pain returns.

## 2020-12-26 NOTE — ED Triage Notes (Signed)
Pt returns with ear pain and headache returning, has been treated multiple times and pain returns

## 2020-12-26 NOTE — ED Provider Notes (Signed)
[facility]   [id number] 12/26/20 Arrival Time: 1305  [id number]  SUBJECTIVE:  Clayton Jensen is a 40 y.o. male who complains of acute on chronic migraine for the past month, with intermittent ear pain.  Worsening symptoms over the past few days.  Denies a precipitating event, or recent head trauma.  Patient localizes her pain to the front of  head.  Has been seen multiple times for similar complaints.  Has been treated with several antibiotic, prednisone, and migraine cocktail with temporary relief.  This is the worst headache of their life.  Patient denies fever, chills, nausea, vomiting, aura, rhinorrhea, chest pain, SOB, abdominal pain, weakness, numbness or tingling, slurred speech.     ROS: As per HPI.  All other pertinent ROS negative.     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   OSA (obstructive sleep apnea)    clinical diagnosis, not officially diagnosed per family   PNA (pneumonia) 08/29/2017   Sepsis (HCC) 08/29/2017   Past Surgical History:  Procedure Laterality Date   KNEE ARTHROSCOPY Left    VIDEO ASSISTED THORACOSCOPY (VATS)/EMPYEMA Right 08/30/2017   Procedure: VIDEO ASSISTED THORACOSCOPY (VATS)/EMPYEMA;  Surgeon: Alleen Borne, MD;  Location: [facility] OR;  Service: Thoracic;  Laterality: Right;   Allergies  Allergen Reactions   Celery Oil Shortness Of Breath and Swelling    Swelling of throat   No current facility-administered medications on file prior to encounter.   Current Outpatient Medications on File Prior to Encounter  Medication Sig Dispense Refill   escitalopram (LEXAPRO) 20 MG tablet Take 1 tab p.o. daily. 90 tablet 1   ibuprofen (ADVIL) 600 MG tablet Take 1 tablet (600 mg total) by mouth every 6 (six) hours as needed. 30 tablet 0   methylphenidate (CONCERTA)  54 MG PO CR tablet Take one tablet po each morning 30 tablet 0   methylphenidate (CONCERTA) 54 MG PO CR tablet Take 1 tablet (54 mg total) by mouth every morning. 30 tablet 0   methylphenidate (CONCERTA) 54 MG PO CR tablet Take one tablet po each morning 30 tablet 0   methylphenidate (RITALIN) 10 MG tablet Take one table po each day at 1:30 pm 30 tablet 0   methylphenidate (RITALIN) 10 MG tablet Take one tablet po each day at 1:30 pm 30 tablet 0   methylphenidate (RITALIN) 10 MG tablet Take one table po each day at 1:30 pm. 30 tablet 0   neomycin-polymyxin-hydrocortisone (CORTISPORIN) 3.5-10000-1 OTIC suspension Place 4 drops into the left ear 3 (three) times daily. 10 mL 0   predniSONE (DELTASONE) 20 MG tablet Take 2 tablets (40 mg total) by mouth daily. 10 tablet 0   Social History   Socioeconomic History   Marital status: Married    Spouse name: Not on file   Number of children: Not on file   Years of education: Not on file   Highest education level: Not on file  Occupational History   Not on file  Tobacco Use   Smoking status: Light Smoker    Packs/day: 0.75    Years: 23.00    Pack years: 17.25    Types: Cigarettes, Cigars   Smokeless tobacco: Never  Vaping Use   Vaping Use: Former  Substance and Sexual Activity  Alcohol use: Yes    Comment: 04/05/2017 "might have a beer q 2 months"   Drug use: No   Sexual activity: Yes  Other Topics Concern   Not on file  Social History Narrative   Not on file   Social Determinants of Health   Financial Resource Strain: Not on file  Food Insecurity: Not on file  Transportation Needs: Not on file  Physical Activity: Not on file  Stress: Not on file  Social Connections: Not on file  Intimate Partner Violence: Not on file   Family History  Problem Relation Age of Onset   COPD Mother    Atrial fibrillation Mother    Diabetes Mother    Diabetes Father     OBJECTIVE:  Vitals:   12/26/20 1320  BP: 130/81  Pulse: 78  Resp: 20   Temp: 98.7 F (37.1 C)  SpO2: 95%    General appearance: alert; no distress Eyes: PERRLA; EOMI HENT: normocephalic; atraumatic; RT EAC and TM erythematous, LT TM appears tense Neck: supple with FROM Lungs: clear to auscultation bilaterally Heart: regular rate and rhythm.   Extremities: no edema; symmetrical with no gross deformities Skin: warm and dry Neurologic: CN 2-12 grossly intact; normal gait Psychological: alert and cooperative; normal mood and affect   ASSESSMENT & PLAN:  1. Worst headache of life   2. Recurrent acute suppurative otitis media of right ear without spontaneous rupture of tympanic membrane     No orders of the defined types were placed in this encounter.  Recommending further evaluation and management in the ED for possible worse/ different headache of life.  Patient and partner aware.  Will travel by private vehicle to ED.      Rennis Harding, PA-C 12/26/20 1354

## 2021-04-07 ENCOUNTER — Ambulatory Visit (INDEPENDENT_AMBULATORY_CARE_PROVIDER_SITE_OTHER): Payer: Self-pay | Admitting: Family Medicine

## 2021-04-07 ENCOUNTER — Other Ambulatory Visit: Payer: Self-pay

## 2021-04-07 ENCOUNTER — Encounter: Payer: Self-pay | Admitting: Family Medicine

## 2021-04-07 VITALS — BP 116/72 | HR 65 | Temp 97.9°F | Wt 375.4 lb

## 2021-04-07 DIAGNOSIS — F900 Attention-deficit hyperactivity disorder, predominantly inattentive type: Secondary | ICD-10-CM

## 2021-04-07 DIAGNOSIS — Z130 Encounter for screening for diseases of the blood and blood-forming organs and certain disorders involving the immune mechanism: Secondary | ICD-10-CM

## 2021-04-07 MED ORDER — METHYLPHENIDATE HCL 10 MG PO TABS: ORAL_TABLET | ORAL | 0 refills | Status: DC

## 2021-04-07 MED ORDER — METHYLPHENIDATE HCL ER (OSM) 54 MG PO TBCR: 54.0000 mg | EXTENDED_RELEASE_TABLET | ORAL | 0 refills | Status: DC

## 2021-04-07 NOTE — Progress Notes (Signed)
Subjective:  Patient ID: Clayton Jensen, male    DOB: [birth year]  Age: 41 y.o. MRN: [id number]  CC: Follow-up  HPI:  41 year old male with morbid obesity, attention deficit disorder presents for follow-up.  Patient states that his ADD is stable and well-controlled on Concerta and an additional dose of methylphenidate in the afternoon.  He is in need of refill today.  He has recently lost some weight.  I congratulated him on this.  Patient has not had labs since 2019.  He has no other complaints or concerns at this time.  Patient Active Problem List   Diagnosis Date Noted   Morbid obesity ([place]) 04/07/2021   ADD (attention deficit disorder) 03/27/2020   History of empyema of pleura 08/30/2017   Chronic lower back pain    History of viral meningitis 04/06/2017    Social Hx   Social History   Socioeconomic History   Marital status: Married    Spouse name: Not on file   Number of children: Not on file   Years of education: Not on file   Highest education level: Not on file  Occupational History   Not on file  Tobacco Use   Smoking status: Light Smoker    Packs/day: 0.75    Years: 23.00    Pack years: 17.25    Types: Cigarettes, Cigars   Smokeless tobacco: Never  Vaping Use   Vaping Use: Former  Substance and Sexual Activity   Alcohol use: Yes    Comment: 04/05/2017 "might have a beer q 2 months"   Drug use: No   Sexual activity: Yes  Other Topics Concern   Not on file  Social History Narrative   Not on file   Social Determinants of Health   Financial Resource Strain: Not on file  Food Insecurity: Not on file  Transportation Needs: Not on file  Physical Activity: Not on file  Stress: Not on file  Social Connections: Not on file    Review of Systems  Constitutional: Negative.   Psychiatric/Behavioral:  Positive for decreased concentration.     Objective:  BP 116/72    Pulse 65    Temp 97.9 F (36.6 C)    Wt (!) 375 lb 6.4 oz (170.3 kg)    SpO2 97%    BMI  50.91 kg/m   BP/Weight 04/07/2021 12/26/2020 0/86/5784  Systolic BP 696 295 284  Diastolic BP 72 56 81  Wt. (Lbs) 375.4 396 -  BMI 50.91 53.71 -    Physical Exam Vitals and nursing note reviewed.  Constitutional:      General: He is not in acute distress.    Appearance: Normal appearance. He is obese. He is not ill-appearing.  HENT:     Head: Normocephalic and atraumatic.  Eyes:     General:        Right eye: No discharge.        Left eye: No discharge.     Conjunctiva/sclera: Conjunctivae normal.  Cardiovascular:     Rate and Rhythm: Normal rate and regular rhythm.  Pulmonary:     Effort: Pulmonary effort is normal.     Breath sounds: Normal breath sounds. No wheezing, rhonchi or rales.  Neurological:     Mental Status: He is alert.  Psychiatric:        Mood and Affect: Mood normal.        Behavior: Behavior normal.    Lab Results  Component Value Date   WBC 10.3 09/14/2017  HGB 13.6 09/14/2017   HCT 38.0 (L) 09/14/2017   PLT 301 09/14/2017   GLUCOSE 122 (H) 09/14/2017   ALT 36 09/14/2017   AST 42 (H) 09/14/2017   NA 138 09/14/2017   K 4.6 09/14/2017   CL 99 09/14/2017   CREATININE 0.86 09/14/2017   BUN 13 09/14/2017   CO2 28 09/14/2017   TSH 3.859 04/04/2017   HGBA1C 5.5 04/05/2017     Assessment & Plan:   Problem List Items Addressed This Visit       Other   ADD (attention deficit disorder) - Primary    Stable.  Medications refilled today.      Relevant Medications   methylphenidate (CONCERTA) 54 MG PO CR tablet   methylphenidate (RITALIN) 10 MG tablet   Morbid obesity (HCC)   Relevant Medications   methylphenidate (CONCERTA) 54 MG PO CR tablet   methylphenidate (RITALIN) 10 MG tablet   Other Relevant Orders   CMP14+EGFR   Hemoglobin A1c   Lipid panel   Other Visit Diagnoses     Screening for deficiency anemia       Relevant Orders   CBC       Meds ordered this encounter  Medications   methylphenidate (CONCERTA) 54 MG PO CR  tablet    Sig: Take 1 tablet (54 mg total) by mouth every morning.    Dispense:  90 tablet    Refill:  0   methylphenidate (RITALIN) 10 MG tablet    Sig: Take one table po each day at 1:30 pm.    Dispense:  90 tablet    Refill:  0    Follow-up:  Return in about 3 months (around 07/06/2021).  [place]

## 2021-04-07 NOTE — Patient Instructions (Signed)
I have refilled your meds.  Labs (fasting) ordered.  Follow up in 3 months.

## 2021-04-07 NOTE — Assessment & Plan Note (Signed)
Stable. Medications refilled today. 

## 2021-04-13 ENCOUNTER — Telehealth: Payer: Self-pay | Admitting: *Deleted

## 2021-04-13 NOTE — Telephone Encounter (Signed)
Message from [employer] [address]: Insurance plan does not cover the Methylhenidate 10mg  one tablet at 1:30pm plan did cover the Methylphenidate 54 mg CR one qam-  Please advise

## 2021-04-14 NOTE — Telephone Encounter (Signed)
Everlene Other G, DO    Is there an alternative? If not, I recommend just sticking with the long acting medication at current dosage.

## 2021-04-14 NOTE — Telephone Encounter (Signed)
Left message to return call 

## 2021-05-06 NOTE — Telephone Encounter (Signed)
Left message to return call 

## 2021-05-13 NOTE — Telephone Encounter (Signed)
Multiple Attempts- patient did not return call

## 2021-05-26 ENCOUNTER — Other Ambulatory Visit: Payer: Self-pay | Admitting: Family Medicine

## 2021-05-26 DIAGNOSIS — F411 Generalized anxiety disorder: Secondary | ICD-10-CM

## 2021-05-26 DIAGNOSIS — F900 Attention-deficit hyperactivity disorder, predominantly inattentive type: Secondary | ICD-10-CM

## 2021-05-26 MED ORDER — METHYLPHENIDATE HCL 10 MG PO TABS: ORAL_TABLET | ORAL | 0 refills | Status: DC

## 2021-05-26 MED ORDER — ESCITALOPRAM OXALATE 20 MG PO TABS: ORAL_TABLET | ORAL | 1 refills | Status: DC

## 2021-05-26 MED ORDER — METHYLPHENIDATE HCL ER (OSM) 54 MG PO TBCR: 54.0000 mg | EXTENDED_RELEASE_TABLET | ORAL | 0 refills | Status: DC

## 2021-05-26 NOTE — Telephone Encounter (Signed)
Patient is requesting all his medications be switched to [facility]

## 2021-06-16 ENCOUNTER — Other Ambulatory Visit: Payer: Self-pay

## 2021-06-16 ENCOUNTER — Encounter (HOSPITAL_COMMUNITY): Payer: Self-pay

## 2021-06-16 ENCOUNTER — Emergency Department (HOSPITAL_COMMUNITY)
Admission: EM | Admit: 2021-06-16 | Discharge: 2021-06-16 | Disposition: A | Discharge status: Home / Self Care | Payer: 59 | Attending: Emergency Medicine | Admitting: Emergency Medicine

## 2021-06-16 DIAGNOSIS — R42 Dizziness and giddiness: Secondary | ICD-10-CM | POA: Insufficient documentation

## 2021-06-16 DIAGNOSIS — H6690 Otitis media, unspecified, unspecified ear: Secondary | ICD-10-CM

## 2021-06-16 DIAGNOSIS — H6691 Otitis media, unspecified, right ear: Secondary | ICD-10-CM | POA: Diagnosis not present

## 2021-06-16 DIAGNOSIS — H9201 Otalgia, right ear: Secondary | ICD-10-CM | POA: Diagnosis not present

## 2021-06-16 HISTORY — DX: Dizziness and giddiness: R42

## 2021-06-16 LAB — BASIC METABOLIC PANEL
Anion gap: 11 (ref 5–15)
BUN: 12 mg/dL (ref 6–20)
CO2: 25 mmol/L (ref 22–32)
Calcium: 8.8 mg/dL — ABNORMAL LOW (ref 8.9–10.3)
Chloride: 103 mmol/L (ref 98–111)
Creatinine, Ser: 0.85 mg/dL (ref 0.61–1.24)
GFR, Estimated: >60 mL/min (ref >60)
Glucose, Bld: 123 mg/dL — ABNORMAL HIGH (ref 70–99)
Potassium: 4 mmol/L (ref 3.5–5.1)
Sodium: 139 mmol/L (ref 135–145)

## 2021-06-16 LAB — CBC
HCT: 45.5 % (ref 39.0–52.0)
Hemoglobin: 15.5 g/dL (ref 13.0–17.0)
MCH: 29.6 pg (ref 26.0–34.0)
MCHC: 34.1 g/dL (ref 30.0–36.0)
MCV: 86.8 fL (ref 80.0–100.0)
Platelets: 156 10*3/uL (ref 150–400)
RBC: 5.24 MIL/uL (ref 4.22–5.81)
RDW: 13.6 % (ref 11.5–15.5)
WBC: 10.5 10*3/uL (ref 4.0–10.5)
nRBC: 0 % (ref 0.0–0.2)

## 2021-06-16 MED ORDER — CEFDINIR 300 MG PO CAPS: 300.0000 mg | ORAL_CAPSULE | Freq: Two times a day (BID) | ORAL | 0 refills | Status: DC

## 2021-06-16 MED ORDER — ONDANSETRON HCL 4 MG PO TABS: 4.0000 mg | ORAL_TABLET | Freq: Four times a day (QID) | ORAL | 0 refills | Status: DC

## 2021-06-16 MED ORDER — CEFDINIR 300 MG PO CAPS
300.0000 mg | ORAL_CAPSULE | Freq: Two times a day (BID) | ORAL | Status: DC
  Administered 2021-06-16: 300 mg via ORAL
  Filled 2021-06-16: qty 1

## 2021-06-16 MED ORDER — MECLIZINE HCL 25 MG PO TABS: 25.0000 mg | ORAL_TABLET | Freq: Three times a day (TID) | ORAL | 0 refills | Status: DC | PRN

## 2021-06-16 NOTE — ED Provider Notes (Signed)
?[facility] EMERGENCY DEPARTMENT ?Provider Note ? ? ?CSN: [id number] ?Arrival date & time: 06/16/21  2024 ? ?  ? ?History ? ?Chief Complaint  ?Patient presents with  ? Dizziness  ? ? ?Clayton Jensen is a 41 y.o. male. ? ?Presents with complaints of right ear pain and dizziness.  He has been having an earache for the last several days.  He reports a history of recurrent ear infections.  He had some "drops" leftover from a prior infection which he started to use over the last couple of days but did not improve.  Today the pain became more of a diffuse pain all over the right side of his head.  He is here became stopped up and now he cannot hear out of it.  Patient had onset of severe dizziness tonight which brought him to the ER.  Dizziness was severe enough that he had acute nausea and vomited 1 time.  Dizziness is now improved. ? ? ?  ? ?Home Medications ?Prior to Admission medications   ?Medication Sig Start Date End Date Taking? Authorizing Provider  ?cefdinir (OMNICEF) 300 MG capsule Take 1 capsule (300 mg total) by mouth 2 (two) times daily. 06/16/21  Yes Jonhatan Hearty, Canary Brim, MD  ?meclizine (ANTIVERT) 25 MG tablet Take 1 tablet (25 mg total) by mouth 3 (three) times daily as needed for dizziness. 06/16/21  Yes Alanmichael Barmore, Canary Brim, MD  ?ondansetron (ZOFRAN) 4 MG tablet Take 1 tablet (4 mg total) by mouth every 6 (six) hours. 06/16/21  Yes Mouhamad Teed, Canary Brim, MD  ?escitalopram (LEXAPRO) 20 MG tablet Take 1 tab p.o. daily. 05/26/21   Tommie Sams, DO  ?methylphenidate (CONCERTA) 54 MG PO CR tablet Take 1 tablet (54 mg total) by mouth every morning. 05/26/21   Tommie Sams, DO  ?methylphenidate (RITALIN) 10 MG tablet Take one table po each day at 1:30 pm. 05/26/21   Tommie Sams, DO  ?   ? ?Allergies    ?Celery oil   ? ?Review of Systems   ?Review of Systems  ?HENT:  Positive for ear pain.   ?Gastrointestinal:  Positive for nausea and vomiting.  ?Neurological:  Positive for dizziness.  ? ?Physical  Exam ?Updated Vital Signs ?BP 127/62   Pulse 60   Temp 97.7 ?F (36.5 ?C) (Oral)   Resp 20   Ht 6' (1.829 m)   Wt (!) 163.3 kg   SpO2 98%   BMI 48.82 kg/m?  ?Physical Exam ?Vitals and nursing note reviewed.  ?Constitutional:   ?   General: He is not in acute distress. ?   Appearance: He is well-developed.  ?HENT:  ?   Head: Normocephalic and atraumatic.  ?   Right Ear: A middle ear effusion is present. Tympanic membrane is erythematous and retracted.  ?   Mouth/Throat:  ?   Mouth: Mucous membranes are moist.  ?Eyes:  ?   General: Vision grossly intact. Gaze aligned appropriately.  ?   Extraocular Movements: Extraocular movements intact.  ?   Conjunctiva/sclera: Conjunctivae normal.  ?Cardiovascular:  ?   Rate and Rhythm: Normal rate and regular rhythm.  ?   Pulses: Normal pulses.  ?   Heart sounds: Normal heart sounds, S1 normal and S2 normal. No murmur heard. ?  No friction rub. No gallop.  ?Pulmonary:  ?   Effort: Pulmonary effort is normal. No respiratory distress.  ?   Breath sounds: Normal breath sounds.  ?Abdominal:  ?   Palpations: Abdomen is soft.  ?  Tenderness: There is no abdominal tenderness. There is no guarding or rebound.  ?   Hernia: No hernia is present.  ?Musculoskeletal:     ?   General: No swelling.  ?   Cervical back: Full passive range of motion without pain, normal range of motion and neck supple. No pain with movement, spinous process tenderness or muscular tenderness. Normal range of motion.  ?   Right lower leg: No edema.  ?   Left lower leg: No edema.  ?Skin: ?   General: Skin is warm and dry.  ?   Capillary Refill: Capillary refill takes less than 2 seconds.  ?   Findings: No ecchymosis, erythema, lesion or wound.  ?Neurological:  ?   Mental Status: He is alert and oriented to person, place, and time.  ?   GCS: GCS eye subscore is 4. GCS verbal subscore is 5. GCS motor subscore is 6.  ?   Cranial Nerves: Cranial nerves 2-12 are intact.  ?   Sensory: Sensation is intact.  ?   Motor:  Motor function is intact. No weakness or abnormal muscle tone.  ?   Coordination: Coordination is intact.  ?Psychiatric:     ?   Mood and Affect: Mood normal.     ?   Speech: Speech normal.     ?   Behavior: Behavior normal.  ? ? ?ED Results / Procedures / Treatments   ?Labs ?(all labs ordered are listed, but only abnormal results are displayed) ?Labs Reviewed  ?BASIC METABOLIC PANEL - Abnormal; Notable for the following components:  ?    Result Value  ? Glucose, Bld 123 (*)   ? Calcium 8.8 (*)   ? All other components within normal limits  ?CBC  ?URINALYSIS, ROUTINE W REFLEX MICROSCOPIC  ?CBG MONITORING, ED  ? ? ?EKG ?None ? ?Radiology ?No results found. ? ?Procedures ?Procedures  ? ? ?Medications Ordered in ED ?Medications - No data to display ? ?ED Course/ Medical Decision Making/ A&P ?  ?                        ?Medical Decision Making ?Amount and/or Complexity of Data Reviewed ?Labs: ordered. ? ? ?Patient presents to the ER for evaluation of right ear pain.  Symptoms ongoing for several days, progressively worsening.  Patient now experiencing cute onset of dizziness.  This has resolved.  Examination reveals obvious right tympanic membrane abnormality consistent with otitis media. ? ?Patient without neurologic deficit.  No concern for stroke.  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 ? ?Nursing staff documented low blood pressure in triage.  This appears to have been equipment error, because in the room his blood pressure has been normal.  Normal heart rate.  Orthostatic vital signs within normal limits.  He does not clinically appear dehydrated. ? ?Will treat with antibiotics for otitis media. ? ? ? ? ? ? ? ?Final Clinical Impression(s) / ED Diagnoses ?Final diagnoses:  ?Acute otitis media, unspecified otitis media type  ? ? ?Rx / DC Orders ?ED Discharge Orders   ? ?      Ordered  ?  cefdinir (OMNICEF) 300 MG capsule  2 times daily       ? 06/16/21 2309   ?  meclizine (ANTIVERT) 25 MG tablet  3 times daily PRN       ? 06/16/21 2309  ?  ondansetron (ZOFRAN) 4  MG tablet  Every 6 hours       ? 06/16/21 2309  ? ?  ?  ? ?  ? ? ?  ?Gilda CreasePollina, Graysen Depaula J, MD ?06/16/21 2309 ? ?

## 2021-06-16 NOTE — ED Triage Notes (Signed)
Pt here for dizziness and "right ear stopped up" that started about an hour or two ago while pt was driving. Pt says his head feels "stopped up also". Pt reports hx of vertigo, but it has been a long time ago. Pt BP in triage is 75/61 on 2nd attempt. ?

## 2021-06-18 ENCOUNTER — Ambulatory Visit (INDEPENDENT_AMBULATORY_CARE_PROVIDER_SITE_OTHER): Payer: Self-pay | Admitting: Family Medicine

## 2021-06-18 ENCOUNTER — Other Ambulatory Visit: Payer: Self-pay

## 2021-06-18 VITALS — BP 114/72 | HR 68 | Temp 97.5°F | Ht 72.0 in | Wt 379.0 lb

## 2021-06-18 DIAGNOSIS — H9311 Tinnitus, right ear: Secondary | ICD-10-CM | POA: Insufficient documentation

## 2021-06-18 DIAGNOSIS — H9191 Unspecified hearing loss, right ear: Secondary | ICD-10-CM | POA: Insufficient documentation

## 2021-06-18 MED ORDER — PREDNISONE 20 MG PO TABS: 60.0000 mg | ORAL_TABLET | Freq: Every day | ORAL | 0 refills | Status: AC

## 2021-06-18 NOTE — Progress Notes (Signed)
? ?Subjective:  ?Patient ID: Clayton Jensen, male    DOB: May 02, 1980  Age: 41 y.o. MRN: [id number] ? ?CC: ?Chief Complaint  ?Patient presents with  ? ER follow up   ?  R ear pain x 2 days , taking oral abx - pain is about same   ? ? ?HPI: ? ?41 year old male presents for evaluation the above. ? ?Patient reports that he has recently experienced earache.  Started a few days prior.  He was using otic drops.  Subsequently developed hearing loss and dizziness which brought him to the ER.  He was seen on 3/14.  He was diagnosed with otitis media and placed on Omnicef. ? ?Patient reports that he cannot hear essentially anything out of his right ear.  He has been compliant with antibiotic.  Dizziness has resolved.  However, he has significant tinnitus. ? ?Patient Active Problem List  ? Diagnosis Date Noted  ? Acute hearing loss of right ear 06/18/2021  ? Tinnitus of right ear 06/18/2021  ? Morbid obesity (HCC) 04/07/2021  ? ADD (attention deficit disorder) 03/27/2020  ? History of empyema of pleura 08/30/2017  ? Chronic lower back pain   ? History of viral meningitis 04/06/2017  ? ? ?Social Hx   ?Social History  ? ?Socioeconomic History  ? Marital status: Married  ?  Spouse name: Not on file  ? Number of children: Not on file  ? Years of education: Not on file  ? Highest education level: Not on file  ?Occupational History  ? Not on file  ?Tobacco Use  ? Smoking status: Light Smoker  ?  Packs/day: 0.75  ?  Years: 23.00  ?  Pack years: 17.25  ?  Types: Cigarettes, Cigars  ? Smokeless tobacco: Never  ?Vaping Use  ? Vaping Use: Former  ?Substance and Sexual Activity  ? Alcohol use: Yes  ?  Comment: 04/05/2017 "might have a beer q 2 months"  ? Drug use: No  ? Sexual activity: Yes  ?Other Topics Concern  ? Not on file  ?Social History Narrative  ? Not on file  ? ?Social Determinants of Health  ? ?Financial Resource Strain: Not on file  ?Food Insecurity: Not on file  ?Transportation Needs: Not on file  ?Physical Activity: Not on file   ?Stress: Not on file  ?Social Connections: Not on file  ? ? ?Review of Systems ?Per HPI ? ?Objective:  ?BP 114/72   Pulse 68   Temp (!) 97.5 ?F (36.4 ?C)   Ht 6' (1.829 m)   Wt (!) 379 lb (171.9 kg)   SpO2 97%   BMI 51.40 kg/m?  ? ?BP/Weight 06/18/2021 06/16/2021 04/07/2021  ?Systolic BP 114 127 116  ?Diastolic BP 72 62 72  ?Wt. (Lbs) 379 360 375.4  ?BMI 51.4 48.82 50.91  ? ? ?Physical Exam ?Vitals and nursing note reviewed.  ?Constitutional:   ?   General: He is not in acute distress. ?   Appearance: Normal appearance. He is obese.  ?HENT:  ?   Head: Normocephalic and atraumatic.  ?   Left Ear: Tympanic membrane normal.  ?   Ears:  ?   Comments: Right TM with no apparent evidence of infection at this time.  No appreciable effusion. ?Cardiovascular:  ?   Rate and Rhythm: Normal rate and regular rhythm.  ?Pulmonary:  ?   Effort: Pulmonary effort is normal. No respiratory distress.  ?Neurological:  ?   Mental Status: He is alert.  ?Psychiatric:     ?  Mood and Affect: Mood normal.     ?   Behavior: Behavior normal.  ? ? ?Lab Results  ?Component Value Date  ? WBC 10.5 06/16/2021  ? HGB 15.5 06/16/2021  ? HCT 45.5 06/16/2021  ? PLT 156 06/16/2021  ? GLUCOSE 123 (H) 06/16/2021  ? ALT 36 09/14/2017  ? AST 42 (H) 09/14/2017  ? NA 139 06/16/2021  ? K 4.0 06/16/2021  ? CL 103 06/16/2021  ? CREATININE 0.85 06/16/2021  ? BUN 12 06/16/2021  ? CO2 25 06/16/2021  ? TSH 3.859 04/04/2017  ? HGBA1C 5.5 04/05/2017  ? ? ? ?Assessment & Plan:  ? ?Problem List Items Addressed This Visit   ? ?  ? Nervous and Auditory  ? Acute hearing loss of right ear - Primary  ?  Concern for acute sensorineural hearing loss.  There is no evidence of effusion or infection on exam.  I advised him to complete the antibiotic course as prescribed by the ER to ensure there is no underlying infection.  Placing on corticosteroids.  Urgent referral to ENT.  Needs audiological evaluation. ?  ?  ? Relevant Orders  ? Ambulatory referral to ENT  ?  ? Other  ?  Tinnitus of right ear  ? Relevant Orders  ? Ambulatory referral to ENT  ? ? ?Meds ordered this encounter  ?Medications  ? predniSONE (DELTASONE) 20 MG tablet  ?  Sig: Take 3 tablets (60 mg total) by mouth daily with breakfast for 10 days.  ?  Dispense:  30 tablet  ?  Refill:  0  ? ? ?Everlene Other DO ?[facility] ? ?

## 2021-06-18 NOTE — Assessment & Plan Note (Signed)
Concern for acute sensorineural hearing loss.  There is no evidence of effusion or infection on exam.  I advised him to complete the antibiotic course as prescribed by the ER to ensure there is no underlying infection.  Placing on corticosteroids.  Urgent referral to ENT.  Needs audiological evaluation. ?

## 2021-06-18 NOTE — Patient Instructions (Signed)
You will receive a call regarding the ENT appt. ? ?Continue the antibiotic. Prednisone as prescribed. ? ?Call with concerns. ? ?Take care ? ?Dr. Adriana Simas  ?

## 2021-07-06 ENCOUNTER — Encounter: Payer: Self-pay | Admitting: Family Medicine

## 2021-07-06 ENCOUNTER — Ambulatory Visit: Payer: Self-pay | Admitting: Family Medicine

## 2021-07-13 ENCOUNTER — Telehealth: Payer: Self-pay | Admitting: *Deleted

## 2021-07-13 NOTE — Telephone Encounter (Signed)
Patient calling for a refill of his methylphenidate (CONCERTA) 54 MG PO CR tablet ? ?Patient was originally written script for #90 and due to supply issues they were only able to fill for a #30 day supply and he needs a new script for #30 [facility] ?

## 2021-07-14 ENCOUNTER — Other Ambulatory Visit: Payer: Self-pay | Admitting: Family Medicine

## 2021-07-14 DIAGNOSIS — F900 Attention-deficit hyperactivity disorder, predominantly inattentive type: Secondary | ICD-10-CM

## 2021-07-14 MED ORDER — METHYLPHENIDATE HCL ER (OSM) 54 MG PO TBCR: 54.0000 mg | EXTENDED_RELEASE_TABLET | ORAL | 0 refills | Status: DC

## 2021-07-14 MED ORDER — METHYLPHENIDATE HCL 10 MG PO TABS: ORAL_TABLET | ORAL | 0 refills | Status: DC

## 2021-08-25 ENCOUNTER — Telehealth: Payer: Self-pay

## 2021-08-25 NOTE — Telephone Encounter (Signed)
Encourage patient to contact the pharmacy for refills or they can request refills through [facility]  (Please schedule appointment if patient has not been seen in over a year)    WHAT PHARMACY WOULD THEY LIKE THIS SENT TO[id number] [place], [place], [place] [address]   MEDICATION NAME & DOSE:methylphenidate ([place]) 54 MG PO CR tablet   NOTES/COMMENTS FROM PATIENT:      [place] office please notify patient: It takes 48-72 hours to process rx refill requests Ask patient to call pharmacy to ensure rx is ready before heading there.

## 2021-08-26 ENCOUNTER — Other Ambulatory Visit: Payer: Self-pay | Admitting: Family Medicine

## 2021-08-26 DIAGNOSIS — F900 Attention-deficit hyperactivity disorder, predominantly inattentive type: Secondary | ICD-10-CM

## 2021-08-26 MED ORDER — METHYLPHENIDATE HCL ER (OSM) 54 MG PO TBCR: 54.0000 mg | EXTENDED_RELEASE_TABLET | ORAL | 0 refills | Status: DC

## 2021-08-26 NOTE — Telephone Encounter (Signed)
Patient notified

## 2021-08-26 NOTE — Telephone Encounter (Signed)
Cook, Jayce G, DO     Rx was sent.    

## 2021-11-10 ENCOUNTER — Other Ambulatory Visit: Payer: Self-pay | Admitting: *Deleted

## 2021-11-10 DIAGNOSIS — F411 Generalized anxiety disorder: Secondary | ICD-10-CM

## 2021-11-10 MED ORDER — ESCITALOPRAM OXALATE 20 MG PO TABS
ORAL_TABLET | ORAL | 0 refills | Status: DC
Start: 2021-11-10 — End: 2021-11-13

## 2021-11-13 ENCOUNTER — Other Ambulatory Visit: Payer: Self-pay | Admitting: Family Medicine

## 2021-11-13 ENCOUNTER — Telehealth: Payer: Self-pay

## 2021-11-13 DIAGNOSIS — F900 Attention-deficit hyperactivity disorder, predominantly inattentive type: Secondary | ICD-10-CM

## 2021-11-13 DIAGNOSIS — F411 Generalized anxiety disorder: Secondary | ICD-10-CM

## 2021-11-13 MED ORDER — METHYLPHENIDATE HCL ER (OSM) 54 MG PO TBCR: 54.0000 mg | EXTENDED_RELEASE_TABLET | ORAL | 0 refills | Status: DC

## 2021-11-13 MED ORDER — ESCITALOPRAM OXALATE 20 MG PO TABS: ORAL_TABLET | ORAL | 0 refills | Status: DC

## 2021-11-13 NOTE — Telephone Encounter (Signed)
Pt contacted and verbalized understanding.  

## 2021-11-13 NOTE — Telephone Encounter (Signed)
Please advise. Thank you

## 2021-11-13 NOTE — Telephone Encounter (Signed)
Encourage patient to contact the pharmacy for refills or they can request refills through [facility]  (Please schedule appointment if patient has not been seen in over a year)    WHAT PHARMACY WOULD THEY LIKE THIS SENT TO: [facility] on [address]   MEDICATION NAME & DOSE:escitalopram (LEXAPRO) 20 MG tablet methylphenidate (CONCERTA) 54 MG PO CR tablet   NOTES/COMMENTS FROM PATIENT:      Front office please notify patient: It takes 48-72 hours to process rx refill requests Ask patient to call pharmacy to ensure rx is ready before heading there.

## 2021-11-19 ENCOUNTER — Ambulatory Visit (HOSPITAL_COMMUNITY)
Admission: RE | Admit: 2021-11-19 | Discharge: 2021-11-19 | Disposition: A | Discharge status: Home / Self Care | Payer: 59 | Source: Ambulatory Visit | Attending: Family Medicine | Admitting: Family Medicine

## 2021-11-19 ENCOUNTER — Other Ambulatory Visit: Payer: Self-pay | Admitting: Family Medicine

## 2021-11-19 ENCOUNTER — Ambulatory Visit (INDEPENDENT_AMBULATORY_CARE_PROVIDER_SITE_OTHER): Payer: 59 | Admitting: Family Medicine

## 2021-11-19 VITALS — BP 134/72 | HR 72 | Temp 97.9°F | Ht 72.0 in | Wt 381.0 lb

## 2021-11-19 DIAGNOSIS — R221 Localized swelling, mass and lump, neck: Secondary | ICD-10-CM | POA: Diagnosis not present

## 2021-11-19 DIAGNOSIS — R030 Elevated blood-pressure reading, without diagnosis of hypertension: Secondary | ICD-10-CM

## 2021-11-19 DIAGNOSIS — R748 Abnormal levels of other serum enzymes: Secondary | ICD-10-CM

## 2021-11-19 DIAGNOSIS — Z8709 Personal history of other diseases of the respiratory system: Secondary | ICD-10-CM

## 2021-11-19 DIAGNOSIS — R590 Localized enlarged lymph nodes: Secondary | ICD-10-CM | POA: Diagnosis not present

## 2021-11-19 DIAGNOSIS — R739 Hyperglycemia, unspecified: Secondary | ICD-10-CM | POA: Diagnosis not present

## 2021-11-19 DIAGNOSIS — Z720 Tobacco use: Secondary | ICD-10-CM | POA: Insufficient documentation

## 2021-11-19 MED ORDER — SEMAGLUTIDE-WEIGHT MANAGEMENT 0.25 MG/0.5ML ~~LOC~~ SOAJ: 0.2500 mg | SUBCUTANEOUS | 0 refills | Status: DC

## 2021-11-19 NOTE — Progress Notes (Signed)
Subjective:  Patient ID: Clayton Jensen, male    DOB: [birth year]  Age: 41 y.o. MRN: [id number]  CC: Chief Complaint  Patient presents with   lump in throat    X 1 yr, pt states can feel with touch as well as when he swallows    HPI:  41 year old male with ADD, history of viral meningitis, history of empyema, morbid obesity, tobacco abuse presents for evaluation of the above.  Patient reports that he has had a "lump" underneath his chin for the past year.  Seems to be more prominent as of this morning.  He would like me to examine this today.  No fever.  Patient's blood pressure initially elevated today.  Repeat was 134/72.  Patient continues to smoke.  Patient expresses concern about his weight.  He states that he has been unable to lose weight.  Weight currently 381 pounds.  BMI 51.67.  We will discuss treatment options today.  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   Elevated BP without diagnosis of hypertension 11/19/2021   Morbid obesity ([place]) 04/07/2021   ADD (attention deficit disorder) 03/27/2020   History of empyema of pleura 08/30/2017   Chronic lower back pain    History of viral meningitis 04/06/2017    Social Hx   Social History   Socioeconomic History   Marital status: Married    Spouse name: Not on file   Number of children: Not on file   Years of education: Not on file   Highest education level: Not on file  Occupational History   Not on file  Tobacco Use   Smoking status: Light Smoker    Packs/day: 0.75    Years: 23.00    Total pack years: 17.25    Types: Cigarettes, Cigars   Smokeless tobacco: Never  Vaping Use   Vaping Use: Former  Substance and Sexual Activity   Alcohol use: Yes    Comment: 04/05/2017 "might have a beer q 2 months"   Drug use: No   Sexual activity: Yes  Other Topics Concern   Not on file  Social History Narrative   Not on file   Social Determinants of  Health   Financial Resource Strain: Not on file  Food Insecurity: Not on file  Transportation Needs: Not on file  Physical Activity: Not on file  Stress: Not on file  Social Connections: Not on file    Review of Systems Per HPI  Objective:  BP 134/72   Pulse 72   Temp 97.9 F (36.6 C)   Ht 6' (1.829 m)   Wt (!) 381 lb (172.8 kg)   SpO2 98%   BMI 51.67 kg/m      11/19/2021   10:51 AM 11/19/2021   10:23 AM 06/18/2021    9:18 AM  BP/Weight  Systolic BP 485 462 703  Diastolic BP 72 84 72  Wt. (Lbs)  381 379  BMI  51.67 kg/m2 51.4 kg/m2    Physical Exam Vitals and nursing note reviewed.  Constitutional:      Appearance: Normal appearance. He is obese.  HENT:     Head: Normocephalic and atraumatic.  Eyes:     General:        Right eye: No discharge.        Left eye: No discharge.     Conjunctiva/sclera: Conjunctivae normal.  Neck:      Comments: Palpable mass noted  in the submental region -labeled location.  Firm.  Movable. Cardiovascular:     Rate and Rhythm: Normal rate and regular rhythm.  Pulmonary:     Effort: Pulmonary effort is normal.     Comments: Wheezing noted. Neurological:     Mental Status: He is alert.  Psychiatric:        Mood and Affect: Mood normal.        Behavior: Behavior normal.     Lab Results  Component Value Date   WBC 10.5 06/16/2021   HGB 15.5 06/16/2021   HCT 45.5 06/16/2021   PLT 156 06/16/2021   GLUCOSE 123 (H) 06/16/2021   ALT 36 09/14/2017   AST 42 (H) 09/14/2017   NA 139 06/16/2021   K 4.0 06/16/2021   CL 103 06/16/2021   CREATININE 0.85 06/16/2021   BUN 12 06/16/2021   CO2 25 06/16/2021   TSH 3.859 04/04/2017   HGBA1C 5.5 04/05/2017     Assessment & Plan:   Problem List Items Addressed This Visit       Other   Elevated BP without diagnosis of hypertension    Obtaining laboratory studies today.      Relevant Orders   CMP14+EGFR   Morbid obesity ([place])    We discussed nutrition, weight loss  medication, and weight loss surgery.  Patient would like to see a nutritionist.  We will place referral.      Relevant Orders   Lipid panel   Amb ref to Medical Nutrition Therapy-MNT   Submental mass - Primary    Uncertain etiology at this time.  Ultrasound for further evaluation today.      Relevant Orders   US Soft Tissue Head/Neck (NON-THYROID)   Other Visit Diagnoses     Blood glucose elevated       Relevant Orders   Hemoglobin A1c      Follow-up: Pending ultrasound findings  [place]

## 2021-11-19 NOTE — Assessment & Plan Note (Signed)
Obtaining laboratory studies today.

## 2021-11-19 NOTE — Assessment & Plan Note (Signed)
We discussed nutrition, weight loss medication, and weight loss surgery.  Patient would like to see a nutritionist.  We will place referral.

## 2021-11-19 NOTE — Assessment & Plan Note (Signed)
Uncertain etiology at this time.  Ultrasound for further evaluation today.

## 2021-11-20 ENCOUNTER — Other Ambulatory Visit: Payer: Self-pay | Admitting: Family Medicine

## 2021-11-20 ENCOUNTER — Ambulatory Visit (HOSPITAL_COMMUNITY)
Admission: RE | Admit: 2021-11-20 | Discharge: 2021-11-20 | Disposition: A | Discharge status: Home / Self Care | Payer: 59 | Source: Ambulatory Visit | Attending: Family Medicine | Admitting: Family Medicine

## 2021-11-20 ENCOUNTER — Encounter (HOSPITAL_COMMUNITY): Payer: Self-pay

## 2021-11-20 DIAGNOSIS — R221 Localized swelling, mass and lump, neck: Secondary | ICD-10-CM | POA: Insufficient documentation

## 2021-11-20 LAB — CMP14+EGFR
ALT: 53 IU/L — ABNORMAL HIGH (ref 0–44)
AST: 55 IU/L — ABNORMAL HIGH (ref 0–40)
Albumin/Globulin Ratio: 1.2 (ref 1.2–2.2)
Albumin: 3.9 g/dL — ABNORMAL LOW (ref 4.1–5.1)
Alkaline Phosphatase: 116 IU/L (ref 44–121)
BUN/Creatinine Ratio: 13 (ref 9–20)
BUN: 11 mg/dL (ref 6–24)
Bilirubin Total: 0.4 mg/dL (ref 0.0–1.2)
CO2: 24 mmol/L (ref 20–29)
Calcium: 9.7 mg/dL (ref 8.7–10.2)
Chloride: 100 mmol/L (ref 96–106)
Creatinine, Ser: 0.88 mg/dL (ref 0.76–1.27)
Globulin, Total: 3.2 g/dL (ref 1.5–4.5)
Glucose: 90 mg/dL (ref 70–99)
Potassium: 4.3 mmol/L (ref 3.5–5.2)
Sodium: 140 mmol/L (ref 134–144)
Total Protein: 7.1 g/dL (ref 6.0–8.5)
eGFR: 109 mL/min/{1.73_m2} (ref >59)

## 2021-11-20 LAB — HEMOGLOBIN A1C
Est. average glucose Bld gHb Est-mCnc: 131 mg/dL
Hgb A1c MFr Bld: 6.2 % — ABNORMAL HIGH (ref 4.8–5.6)

## 2021-11-20 LAB — LIPID PANEL
Chol/HDL Ratio: 4.6 ratio (ref 0.0–5.0)
Cholesterol, Total: 202 mg/dL — ABNORMAL HIGH (ref 100–199)
HDL: 44 mg/dL (ref >39)
LDL Chol Calc (NIH): 133 mg/dL — ABNORMAL HIGH (ref 0–99)
Triglycerides: 137 mg/dL (ref 0–149)
VLDL Cholesterol Cal: 25 mg/dL (ref 5–40)

## 2021-11-20 MED ORDER — IOHEXOL 300 MG/ML  SOLN
75.0000 mL | Freq: Once | INTRAMUSCULAR | Status: AC | PRN
  Administered 2021-11-20: 75 mL via INTRAVENOUS

## 2021-11-20 MED ORDER — SODIUM CHLORIDE (PF) 0.9 % IJ SOLN
INTRAMUSCULAR | Status: AC
  Filled 2021-11-20: qty 50

## 2021-11-23 ENCOUNTER — Encounter: Payer: Self-pay | Admitting: Family Medicine

## 2021-11-23 ENCOUNTER — Telehealth: Payer: Self-pay | Admitting: Family Medicine

## 2021-11-23 NOTE — Addendum Note (Signed)
Addended by: Margaretha Sheffield on: 11/23/2021 11:32 AM   Modules accepted: Orders

## 2021-11-23 NOTE — Telephone Encounter (Signed)
Patient is requesting refill on ritalin 10 mg [facility]  [facility]

## 2021-11-24 ENCOUNTER — Other Ambulatory Visit: Payer: Self-pay | Admitting: Family Medicine

## 2021-11-24 DIAGNOSIS — F900 Attention-deficit hyperactivity disorder, predominantly inattentive type: Secondary | ICD-10-CM

## 2021-11-24 MED ORDER — METHYLPHENIDATE HCL 10 MG PO TABS: ORAL_TABLET | ORAL | 0 refills | Status: DC

## 2021-11-24 NOTE — Telephone Encounter (Signed)
Pt contacted and verbalized understanding.  

## 2021-11-30 DIAGNOSIS — R221 Localized swelling, mass and lump, neck: Secondary | ICD-10-CM | POA: Insufficient documentation

## 2021-11-30 DIAGNOSIS — R599 Enlarged lymph nodes, unspecified: Secondary | ICD-10-CM | POA: Diagnosis not present

## 2021-12-01 ENCOUNTER — Other Ambulatory Visit (HOSPITAL_COMMUNITY): Payer: Self-pay | Admitting: Otolaryngology

## 2021-12-01 ENCOUNTER — Other Ambulatory Visit: Payer: Self-pay | Admitting: Otolaryngology

## 2021-12-01 DIAGNOSIS — R221 Localized swelling, mass and lump, neck: Secondary | ICD-10-CM

## 2021-12-02 ENCOUNTER — Other Ambulatory Visit (HOSPITAL_COMMUNITY): Payer: Self-pay | Admitting: Otolaryngology

## 2021-12-02 NOTE — Progress Notes (Unsigned)
Berdine Dance, MD  [facility] for [place] core bx large submental node   TS   See [place] and neck CT   TS

## 2021-12-04 ENCOUNTER — Ambulatory Visit (HOSPITAL_COMMUNITY): Payer: 59

## 2021-12-11 ENCOUNTER — Ambulatory Visit (HOSPITAL_COMMUNITY)
Admission: RE | Admit: 2021-12-11 | Discharge: 2021-12-11 | Disposition: A | Discharge status: Home / Self Care | Payer: 59 | Source: Ambulatory Visit | Attending: Family Medicine | Admitting: Family Medicine

## 2021-12-11 DIAGNOSIS — K7689 Other specified diseases of liver: Secondary | ICD-10-CM | POA: Diagnosis not present

## 2021-12-11 DIAGNOSIS — R748 Abnormal levels of other serum enzymes: Secondary | ICD-10-CM | POA: Insufficient documentation

## 2021-12-15 ENCOUNTER — Other Ambulatory Visit: Payer: Self-pay | Admitting: Family Medicine

## 2021-12-15 DIAGNOSIS — F411 Generalized anxiety disorder: Secondary | ICD-10-CM

## 2021-12-15 NOTE — Telephone Encounter (Signed)
Please contact patient to schedule an appt

## 2021-12-15 NOTE — Telephone Encounter (Signed)
Sent my chart message to schedule appointment 12/15/21

## 2021-12-16 ENCOUNTER — Other Ambulatory Visit: Payer: Self-pay | Admitting: Internal Medicine

## 2021-12-16 DIAGNOSIS — R221 Localized swelling, mass and lump, neck: Secondary | ICD-10-CM

## 2021-12-17 ENCOUNTER — Ambulatory Visit (HOSPITAL_COMMUNITY)
Admission: RE | Admit: 2021-12-17 | Discharge: 2021-12-17 | Disposition: A | Discharge status: Home / Self Care | Payer: 59 | Source: Ambulatory Visit | Attending: Otolaryngology | Admitting: Otolaryngology

## 2021-12-17 ENCOUNTER — Encounter (HOSPITAL_COMMUNITY): Payer: Self-pay

## 2021-12-17 NOTE — Progress Notes (Signed)
Pt has not arrived for biopsy procedure. If biopsy still needed, pt will need to r/s.

## 2021-12-31 ENCOUNTER — Ambulatory Visit (INDEPENDENT_AMBULATORY_CARE_PROVIDER_SITE_OTHER): Payer: 59 | Admitting: Family Medicine

## 2021-12-31 ENCOUNTER — Encounter: Payer: Self-pay | Admitting: Family Medicine

## 2021-12-31 DIAGNOSIS — F900 Attention-deficit hyperactivity disorder, predominantly inattentive type: Secondary | ICD-10-CM

## 2021-12-31 DIAGNOSIS — R221 Localized swelling, mass and lump, neck: Secondary | ICD-10-CM

## 2021-12-31 DIAGNOSIS — R69 Illness, unspecified: Secondary | ICD-10-CM | POA: Diagnosis not present

## 2021-12-31 DIAGNOSIS — Z720 Tobacco use: Secondary | ICD-10-CM

## 2021-12-31 MED ORDER — METHYLPHENIDATE HCL ER (OSM) 54 MG PO TBCR: 54.0000 mg | EXTENDED_RELEASE_TABLET | ORAL | 0 refills | Status: DC

## 2021-12-31 MED ORDER — METHYLPHENIDATE HCL 10 MG PO TABS: 10.0000 mg | ORAL_TABLET | Freq: Every day | ORAL | 0 refills | Status: DC

## 2021-12-31 MED ORDER — VARENICLINE TARTRATE (STARTER) 0.5 MG X 11 & 1 MG X 42 PO TBPK: ORAL_TABLET | ORAL | 0 refills | Status: DC

## 2021-12-31 MED ORDER — VARENICLINE TARTRATE 1 MG PO TABS: 1.0000 mg | ORAL_TABLET | Freq: Two times a day (BID) | ORAL | 0 refills | Status: DC

## 2021-12-31 MED ORDER — METHYLPHENIDATE HCL 10 MG PO TABS: ORAL_TABLET | ORAL | 0 refills | Status: DC

## 2021-12-31 NOTE — Patient Instructions (Signed)
Medications as prescribed.  Follow-up in 3 months  Take care  Dr. Rea Reser  

## 2021-12-31 NOTE — Assessment & Plan Note (Signed)
Stable.  Continue Concerta and short acting Ritalin as directed.  Refilled today.

## 2021-12-31 NOTE — Assessment & Plan Note (Addendum)
Pathology was reactive/benign.  Has not scheduled follow-up with ENT.

## 2021-12-31 NOTE — Progress Notes (Signed)
Subjective:  Patient ID: Clayton Jensen, male    DOB: 09-Sep-1980  Age: 41 y.o. MRN: [id number]  CC: Chief Complaint  Patient presents with   ADHD    Pt arrives to follow up on 54 mg Concerta. Doing well; no issues. Ritalin at 1:30. Unable to pick up weight loss med due to it being on back order    HPI:  41 year old male presents for follow-up.  Patient's ADHD is stable.  No adverse medication effects.  He is currently on Concerta and short acting Ritalin.  Needs refills.  Patient continues to smoke approximately 1 pack/day.  He is interested in quitting.  We will discuss options for smoking cessation today including nicotine replacement, Wellbutrin, Chantix.  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  A biopsy was obtained and was reactive/benign.  He has not scheduled follow-up with ENT.  Needs to do so.  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   Morbid obesity ([place]) 04/07/2021   ADD (attention deficit disorder) 03/27/2020   History of empyema of pleura 08/30/2017   Chronic lower back pain    History of viral meningitis 04/06/2017    Social Hx   Social History   Socioeconomic History   Marital status: Married    Spouse name: Not on file   Number of children: Not on file   Years of education: Not on file   Highest education level: Not on file  Occupational History   Not on file  Tobacco Use   Smoking status: Light Smoker    Packs/day: 0.75    Years: 23.00    Total pack years: 17.25    Types: Cigarettes, Cigars   Smokeless tobacco: Never  Vaping Use   Vaping Use: Former  Substance and Sexual Activity   Alcohol use: Yes    Comment: 04/05/2017 "might have a beer q 2 months"   Drug use: No   Sexual activity: Yes  Other Topics Concern   Not on file  Social History Narrative   Not on file   Social Determinants of Health   Financial Resource Strain: Not  on file  Food Insecurity: Not on file  Transportation Needs: Not on file  Physical Activity: Not on file  Stress: Not on file  Social Connections: Not on file    Review of Systems Per HPI  Objective:  BP 122/77   Pulse 61   Temp (!) 97.3 F (36.3 C)   Wt (!) 368 lb 3.2 oz (167 kg)   SpO2 97%   BMI 49.94 kg/m      12/31/2021    9:39 AM 11/19/2021   10:51 AM 11/19/2021   10:23 AM  BP/Weight  Systolic BP 213 086 578  Diastolic BP 77 72 84  Wt. (Lbs) 368.2  381  BMI 49.94 kg/m2  51.67 kg/m2    Physical Exam Vitals and nursing note reviewed.  Constitutional:      General: He is not in acute distress.    Appearance: Normal appearance. He is obese. He is not ill-appearing.  HENT:     Head: Normocephalic and atraumatic.     Comments: Palpable firm submental lymph node.  Slightly smaller from previous examination. Cardiovascular:     Rate and Rhythm: Normal rate and regular rhythm.  Pulmonary:     Effort: Pulmonary effort is normal.     Breath sounds: Normal breath  sounds. No wheezing, rhonchi or rales.  Neurological:     Mental Status: He is alert.  Psychiatric:        Mood and Affect: Mood normal.        Behavior: Behavior normal.     Lab Results  Component Value Date   WBC 10.5 06/16/2021   HGB 15.5 06/16/2021   HCT 45.5 06/16/2021   PLT 156 06/16/2021   GLUCOSE 90 11/19/2021   CHOL 202 (H) 11/19/2021   TRIG 137 11/19/2021   HDL 44 11/19/2021   LDLCALC 133 (H) 11/19/2021   ALT 53 (H) 11/19/2021   AST 55 (H) 11/19/2021   NA 140 11/19/2021   K 4.3 11/19/2021   CL 100 11/19/2021   CREATININE 0.88 11/19/2021   BUN 11 11/19/2021   CO2 24 11/19/2021   TSH 3.859 04/04/2017   HGBA1C 6.2 (H) 11/19/2021     Assessment & Plan:   Problem List Items Addressed This Visit       Other   ADD (attention deficit disorder)    Stable.  Continue Concerta and short acting Ritalin as directed.  Refilled today.      Relevant Medications   methylphenidate  (CONCERTA) 54 MG PO CR tablet   methylphenidate (RITALIN) 10 MG tablet (Start on 03/02/2022)   Submental mass    Pathology was reactive/benign.  Has not scheduled follow-up with ENT.      Tobacco abuse    After discussion today, starting Chantix.       Meds ordered this encounter  Medications   methylphenidate (CONCERTA) 54 MG PO CR tablet    Sig: Take 1 tablet (54 mg total) by mouth every morning.    Dispense:  30 tablet    Refill:  0   methylphenidate (RITALIN) 10 MG tablet    Sig: Take one table po each day at 1:30 pm.    Dispense:  30 tablet    Refill:  0   methylphenidate 54 MG PO CR tablet    Sig: Take 1 tablet (54 mg total) by mouth every morning.    Dispense:  30 tablet    Refill:  0   methylphenidate 54 MG PO CR tablet    Sig: Take 1 tablet (54 mg total) by mouth every morning.    Dispense:  30 tablet    Refill:  0   methylphenidate (RITALIN) 10 MG tablet    Sig: Take 1 tablet (10 mg total) by mouth daily. At 1:30.    Dispense:  30 tablet    Refill:  0   methylphenidate (RITALIN) 10 MG tablet    Sig: Take 1 tablet (10 mg total) by mouth daily. At 1:30    Dispense:  30 tablet    Refill:  0   Varenicline Tartrate, Starter, (CHANTIX STARTING MONTH PAK) 0.5 MG X 11 & 1 MG X 42 TBPK    Sig: Days 1 to 3: 0.5 mg once daily. Days 4 to 7: 0.5 mg twice daily. Maintenance (day 8 and later): 1 mg twice daily    Dispense:  53 each    Refill:  0   varenicline (CHANTIX CONTINUING MONTH PAK) 1 MG tablet    Sig: Take 1 tablet (1 mg total) by mouth 2 (two) times daily.    Dispense:  120 tablet    Refill:  0    Follow-up:  3 months.  [place]

## 2021-12-31 NOTE — Assessment & Plan Note (Signed)
After discussion today, starting Chantix.

## 2022-01-04 NOTE — Telephone Encounter (Signed)
Had appointment on 12/31/21

## 2022-01-06 ENCOUNTER — Ambulatory Visit: Payer: 59 | Admitting: Nutrition

## 2022-01-20 ENCOUNTER — Encounter: Payer: Self-pay | Admitting: Nutrition

## 2022-01-20 ENCOUNTER — Encounter: Payer: 59 | Attending: Family Medicine | Admitting: Nutrition

## 2022-01-20 DIAGNOSIS — R7303 Prediabetes: Secondary | ICD-10-CM | POA: Insufficient documentation

## 2022-01-20 NOTE — Progress Notes (Unsigned)
Medical Nutrition Therapy  Appointment Start time:  0800   Appointment End time:  0900  Primary concerns today: Obesity, Hyperlipidemia.  Referral diagnosis: E66.01 Learning readiness: Ready   NUTRITION ASSESSMENT  Has lost from 381 and down to 367 Cut out soda, drinking water, cut down on portions, cut out processed foods. Increased fresh fruits, vegetables and whole grains.  Anthropometrics  Wt Readings from Last 3 Encounters:  01/20/22 (!) 367 lb (166.5 kg)  12/31/21 (!) 368 lb 3.2 oz (167 kg)  11/19/21 (!) 381 lb (172.8 kg)   Ht Readings from Last 3 Encounters:  01/20/22 6' (1.829 m)  11/19/21 6' (1.829 m)  06/18/21 6' (1.829 m)   Body mass index is 49.77 kg/m. @BMIFA @ Facility age limit for growth %iles is 20 years. Facility age limit for growth %iles is 20 years.    Clinical Medical Hx: *** Medications: *** Labs: *** Notable Signs/Symptoms: ***  Lifestyle & Dietary Hx ***  Estimated daily fluid intake: *** oz Supplements: *** Sleep: *** Stress / self-care: *** Current average weekly physical activity: ***  24-Hr Dietary Recall First Meal: *** Snack: *** Second Meal: *** Snack: *** Third Meal: *** Snack: *** Beverages: ***  Estimated Energy Needs Calories: *** Carbohydrate: ***g Protein: ***g Fat: ***g   NUTRITION DIAGNOSIS  {CHL AMB NUTRITIONAL DIAGNOSIS:[phone]}   NUTRITION INTERVENTION  Nutrition education (E-1) on the following topics:  ***  Handouts Provided Include  ***  Learning Style & Readiness for Change Teaching method utilized: Visual & Auditory  Demonstrated degree of understanding via: Teach Back  Barriers to learning/adherence to lifestyle change: ***  Goals Established by Pt ***   MONITORING & EVALUATION Dietary intake, weekly physical activity, and *** in ***.  Next Steps  Patient is to ***.

## 2022-01-20 NOTE — Patient Instructions (Signed)
Goals  Lose 1-2 lbs per week. Exercise by walking 20-30 minutes 4 times per week. Eating meals on time Focus on more whole plant based foods.

## 2022-01-21 ENCOUNTER — Encounter: Payer: Self-pay | Admitting: Nutrition

## 2022-01-22 ENCOUNTER — Other Ambulatory Visit: Payer: Self-pay | Admitting: Family Medicine

## 2022-01-22 ENCOUNTER — Telehealth: Payer: Self-pay | Admitting: Family Medicine

## 2022-01-22 NOTE — Telephone Encounter (Signed)
[facility] in August 2023. Pt states that he didn't get med last month. Please advise. Thank you.

## 2022-01-22 NOTE — Telephone Encounter (Signed)
Pt requesting refill on Concerta 54 mg to [facility] [place] [facility]. Please advise. Thank you

## 2022-01-23 ENCOUNTER — Other Ambulatory Visit: Payer: Self-pay | Admitting: Family Medicine

## 2022-02-10 IMAGING — CT CT HEAD W/O CM
3 series · 14 of 47 positions shown, 16 images · non-contrast
Comparison: 04/04/2017

CLINICAL DATA: Headache for 1 month, ear pain.

EXAM:
CT HEAD WITHOUT CONTRAST
TECHNIQUE: Contiguous axial images were obtained from the base of the skull
through the vertex without intravenous contrast.

[Series 2: head w o · axial · 0.46mm/px · z∈[+46,+176]mm · 8 of 32 slices shown, 10 images]
[im 3/32  brain]
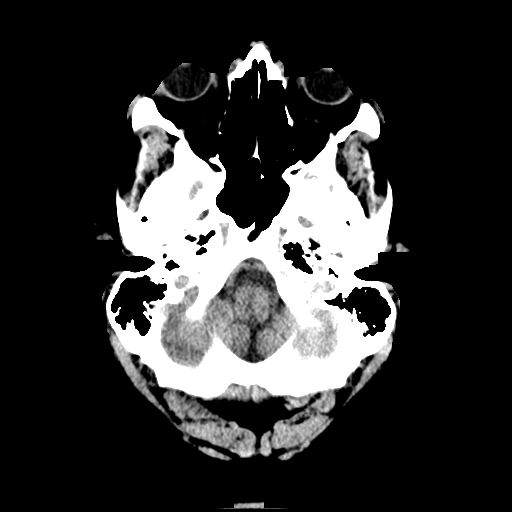
[im 3/32  bone]
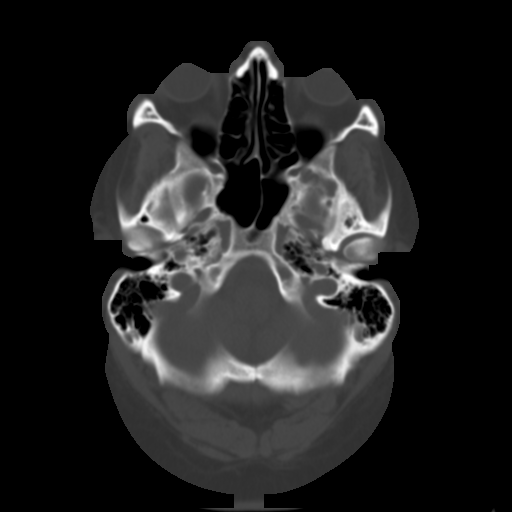
[im 7/32  brain]
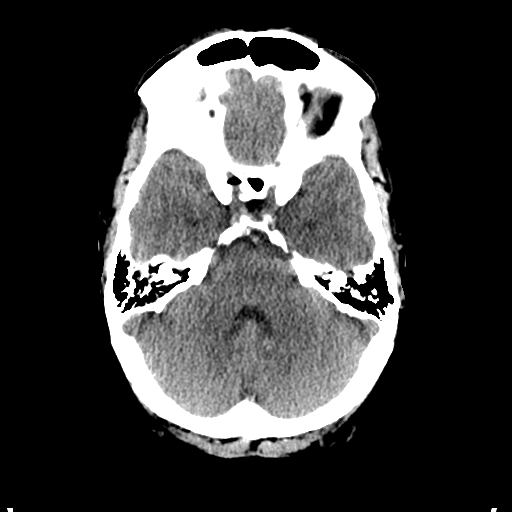
[im 10/32  brain]
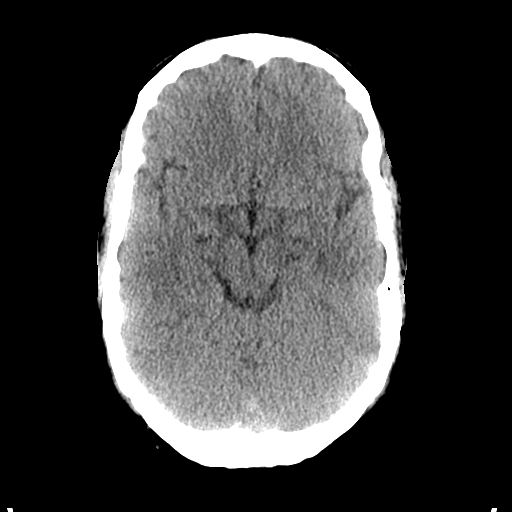
[im 14/32  brain]
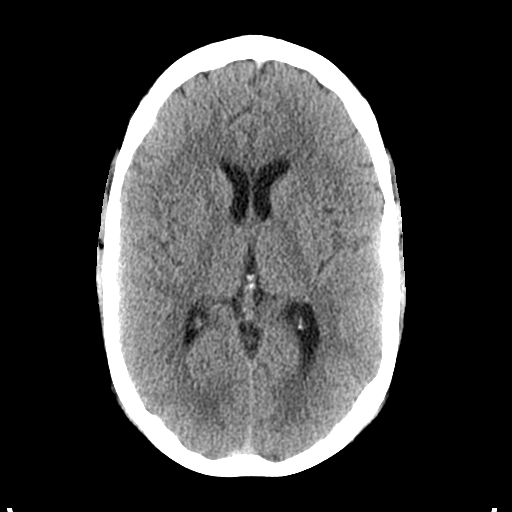
[im 18/32  brain]
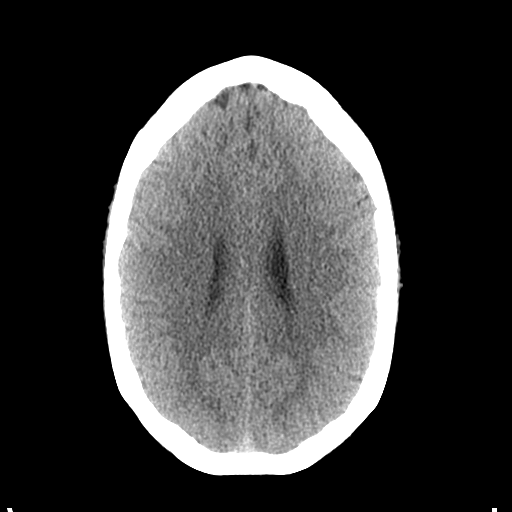
[im 18/32  bone]
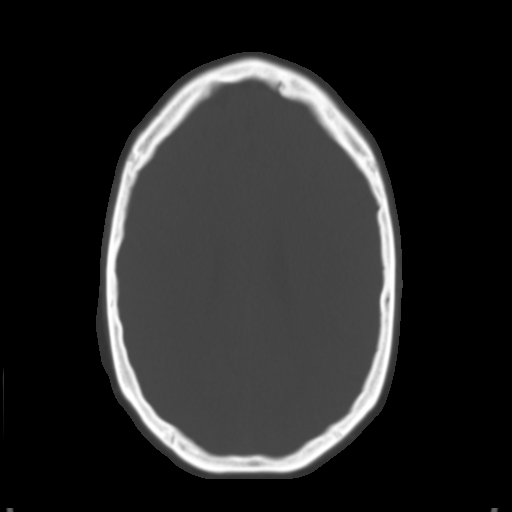
[im 22/32  brain]
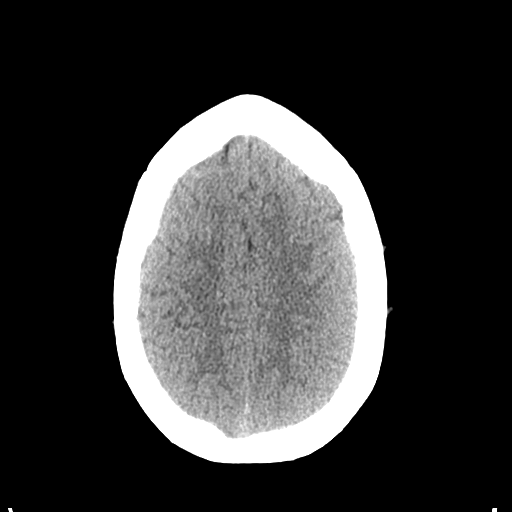
[im 25/32  brain]
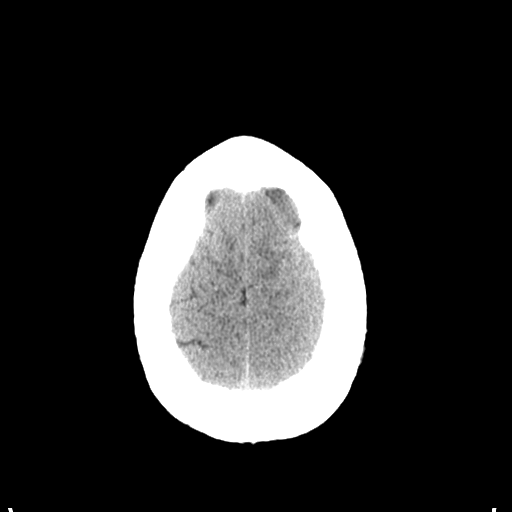
[im 29/32  brain]
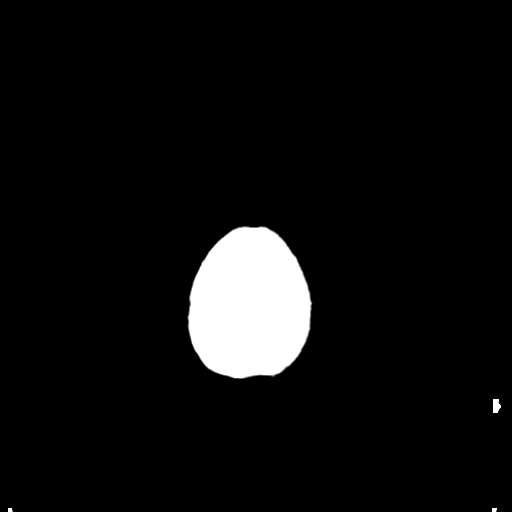

[Series 4: coronal soft · coronal · 0.31mm/px · 3 of 75 slices shown]
[im 25/75  brain]
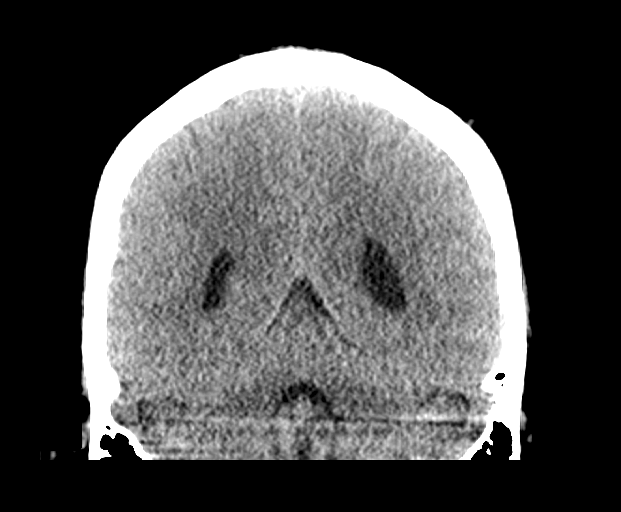
[im 33/75  brain]
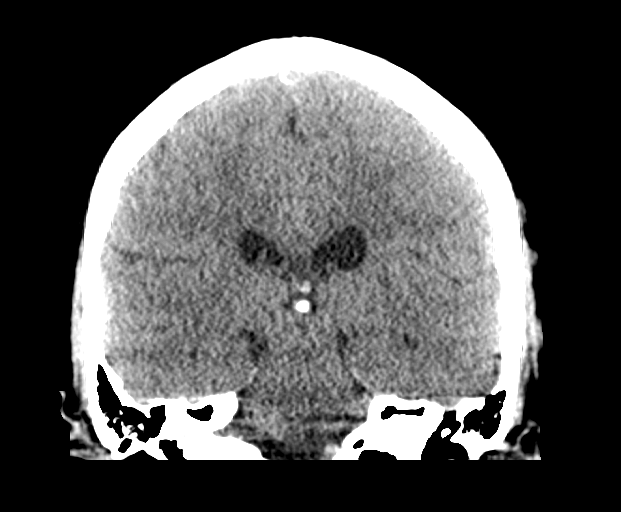
[im 42/75  brain]
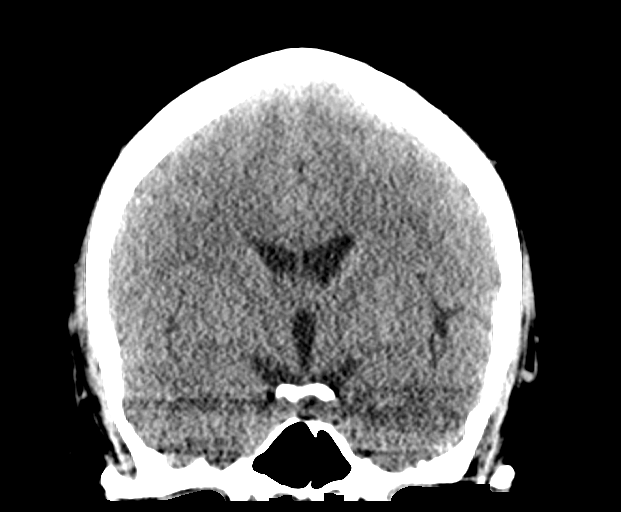

[Series 5: sagittal soft · sagittal · 0.30mm/px · 3 of 56 slices shown]
[im 19/56  brain]
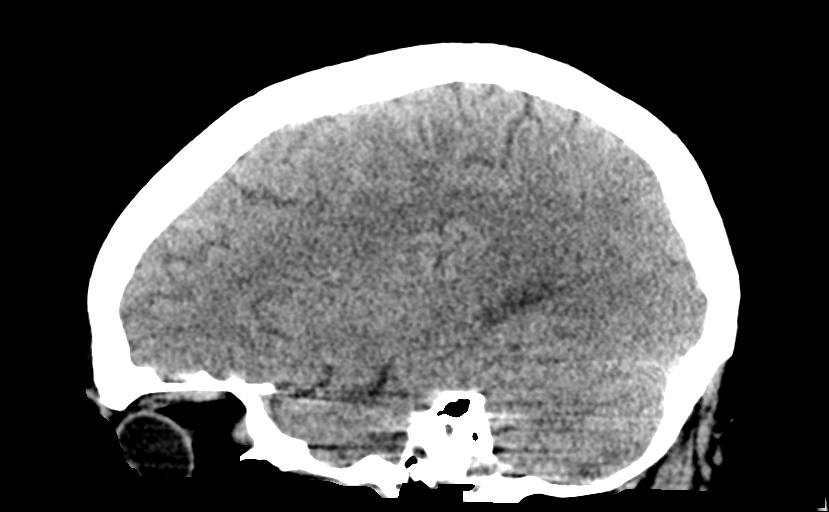
[im 28/56  brain]
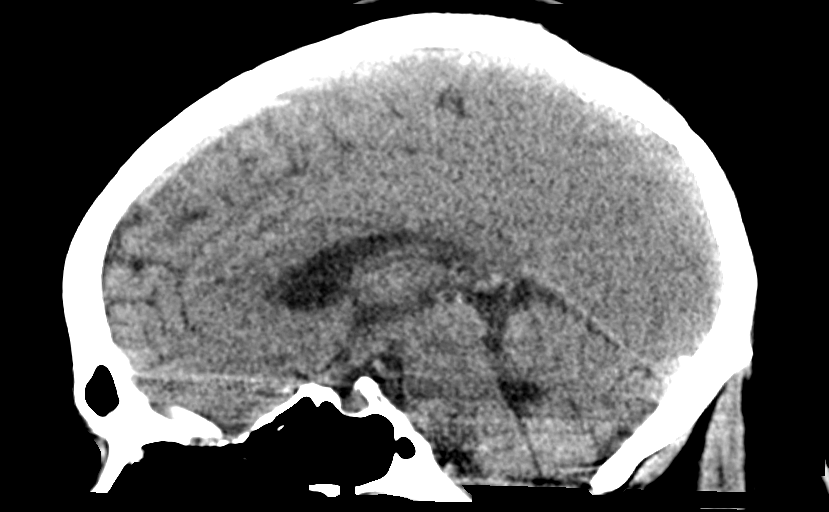
[im 37/56  brain]
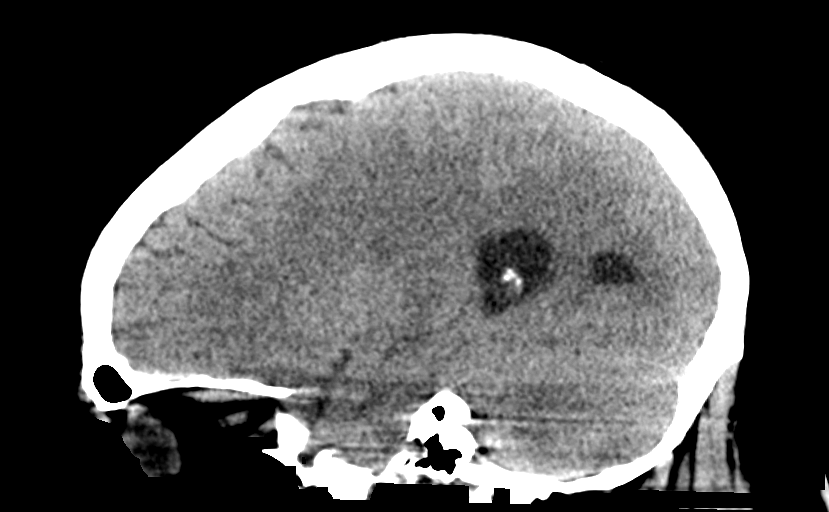

[14 of 47 positions shown; findings below may reference images not displayed]

FINDINGS: Brain: No evidence of acute infarction, hemorrhage, hydrocephalus,
extra-axial collection or mass lesion/mass effect.

Vascular: No hyperdense vessel or unexpected calcification.

Skull: Normal. Negative for fracture or focal lesion.

Sinuses/Orbits: No acute finding.

Other: None.
IMPRESSION: No acute intracranial findings.

## 2022-03-08 ENCOUNTER — Ambulatory Visit: Payer: 59 | Admitting: Nutrition

## 2022-04-05 ENCOUNTER — Other Ambulatory Visit: Payer: Self-pay | Admitting: Family Medicine

## 2022-04-08 ENCOUNTER — Other Ambulatory Visit: Payer: Self-pay | Admitting: Family Medicine

## 2022-04-09 ENCOUNTER — Other Ambulatory Visit: Payer: Self-pay | Admitting: Family Medicine

## 2022-05-05 ENCOUNTER — Telehealth: Payer: Self-pay | Admitting: Family Medicine

## 2022-05-05 NOTE — Telephone Encounter (Signed)
Pt requesting refill on Concerta 54 mg to [facility]. Please advise. Thank you

## 2022-05-06 ENCOUNTER — Other Ambulatory Visit: Payer: Self-pay | Admitting: Family Medicine

## 2022-05-06 MED ORDER — METHYLPHENIDATE HCL ER (OSM) 54 MG PO TBCR: 54.0000 mg | EXTENDED_RELEASE_TABLET | ORAL | 0 refills | Status: DC

## 2022-05-06 NOTE — Telephone Encounter (Signed)
Pt contacted and verbalized understanding. Follow up scheduled for 05/18/22

## 2022-05-18 ENCOUNTER — Ambulatory Visit (INDEPENDENT_AMBULATORY_CARE_PROVIDER_SITE_OTHER): Payer: 59 | Admitting: Family Medicine

## 2022-05-18 ENCOUNTER — Encounter: Payer: Self-pay | Admitting: Family Medicine

## 2022-05-18 VITALS — BP 118/68 | HR 82 | Temp 96.6°F | Ht 72.0 in | Wt 376.0 lb

## 2022-05-18 DIAGNOSIS — R7303 Prediabetes: Secondary | ICD-10-CM | POA: Insufficient documentation

## 2022-05-18 DIAGNOSIS — F900 Attention-deficit hyperactivity disorder, predominantly inattentive type: Secondary | ICD-10-CM

## 2022-05-18 DIAGNOSIS — R69 Illness, unspecified: Secondary | ICD-10-CM | POA: Diagnosis not present

## 2022-05-18 DIAGNOSIS — E785 Hyperlipidemia, unspecified: Secondary | ICD-10-CM | POA: Insufficient documentation

## 2022-05-18 MED ORDER — METHYLPHENIDATE HCL 10 MG PO TABS: 10.0000 mg | ORAL_TABLET | Freq: Every day | ORAL | 0 refills | Status: DC

## 2022-05-18 MED ORDER — METHYLPHENIDATE HCL ER (OSM) 54 MG PO TBCR: 54.0000 mg | EXTENDED_RELEASE_TABLET | ORAL | 0 refills | Status: DC

## 2022-05-18 MED ORDER — METHYLPHENIDATE HCL 10 MG PO TABS: ORAL_TABLET | ORAL | 0 refills | Status: DC

## 2022-05-18 NOTE — Assessment & Plan Note (Signed)
Stable.  Continue current medications.  3 months of refills given.

## 2022-05-18 NOTE — Progress Notes (Signed)
Subjective:  Patient ID: Clayton Jensen, male    DOB: 11/29/1980  Age: 42 y.o. MRN: [id number]  CC: Chief Complaint  Patient presents with   ADHD    HPI:  42 year old male with the below mentioned past medical problems presents for follow-up regarding ADD.  Patient states that he is doing well on his medication.  No reported side effects.  Needs refills.  Denies chest pain or shortness of breath.  Patient continues to smoke.  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   Morbid obesity ([place]) 04/07/2021   ADD (attention deficit disorder) 03/27/2020   History of empyema of pleura 08/30/2017   Chronic lower back pain    History of viral meningitis 04/06/2017    Social Hx   Social History   Socioeconomic History   Marital status: Married    Spouse name: Not on file   Number of children: Not on file   Years of education: Not on file   Highest education level: Not on file  Occupational History   Not on file  Tobacco Use   Smoking status: Light Smoker    Packs/day: 0.75    Years: 23.00    Total pack years: 17.25    Types: Cigarettes, Cigars   Smokeless tobacco: Never  Vaping Use   Vaping Use: Former  Substance and Sexual Activity   Alcohol use: Yes    Comment: 04/05/2017 "might have a beer q 2 months"   Drug use: No   Sexual activity: Yes  Other Topics Concern   Not on file  Social History Narrative   Not on file   Social Determinants of Health   Financial Resource Strain: Not on file  Food Insecurity: Not on file  Transportation Needs: Not on file  Physical Activity: Not on file  Stress: Not on file  Social Connections: Not on file    Review of Systems Per HPI  Objective:  BP 118/68   Pulse 82   Temp (!) 96.6 F (35.9 C)   Ht 6' (1.829 m)   Wt (!) 376 lb (170.6 kg)   SpO2 96%   BMI 50.99 kg/m      05/18/2022   10:34 AM  01/20/2022    8:12 AM 12/31/2021    9:39 AM  BP/Weight  Systolic BP 123456  [id number]  Diastolic BP 68  77  Wt. (Lbs) 376 367 368.2  BMI 50.99 kg/m2 49.77 kg/m2 49.94 kg/m2    Physical Exam Constitutional:      Appearance: Normal appearance. He is obese.  HENT:     Head: Normocephalic and atraumatic.  Eyes:     General:        Right eye: No discharge.        Left eye: No discharge.     Conjunctiva/sclera: Conjunctivae normal.  Cardiovascular:     Rate and Rhythm: Normal rate and regular rhythm.  Pulmonary:     Effort: Pulmonary effort is normal.     Breath sounds: Wheezing present.  Neurological:     Mental Status: He is alert.     Lab Results  Component Value Date   WBC 10.5 06/16/2021   HGB 15.5 06/16/2021   HCT 45.5 06/16/2021   PLT 156 06/16/2021   GLUCOSE 90 11/19/2021   CHOL 202 (H) 11/19/2021   TRIG 137 11/19/2021   HDL 44 11/19/2021  LDLCALC 133 (H) 11/19/2021   ALT 53 (H) 11/19/2021   AST 55 (H) 11/19/2021   NA 140 11/19/2021   K 4.3 11/19/2021   CL 100 11/19/2021   CREATININE 0.88 11/19/2021   BUN 11 11/19/2021   CO2 24 11/19/2021   TSH 3.859 04/04/2017   HGBA1C 6.2 (H) 11/19/2021     Assessment & Plan:   Problem List Items Addressed This Visit       Other   ADD (attention deficit disorder) - Primary    Stable.  Continue current medications.  3 months of refills given.      Relevant Medications   methylphenidate (RITALIN) 10 MG tablet (Start on 05/30/2022)    Meds ordered this encounter  Medications   methylphenidate 54 MG PO CR tablet    Sig: Take 1 tablet (54 mg total) by mouth every morning.    Dispense:  30 tablet    Refill:  0   methylphenidate 54 MG PO CR tablet    Sig: Take 1 tablet (54 mg total) by mouth every morning.    Dispense:  30 tablet    Refill:  0   methylphenidate (RITALIN) 10 MG tablet    Sig: Take 1 tablet (10 mg total) by mouth daily. At 1:30    Dispense:  30 tablet    Refill:  0   methylphenidate (RITALIN) 10 MG  tablet    Sig: Take 1 tablet (10 mg total) by mouth daily. At 1:30.    Dispense:  30 tablet    Refill:  0   methylphenidate (RITALIN) 10 MG tablet    Sig: Take one table po each day at 1:30 pm.    Dispense:  30 tablet    Refill:  0   methylphenidate 54 MG PO CR tablet    Sig: Take 1 tablet (54 mg total) by mouth every morning.    Dispense:  30 tablet    Refill:  0    Follow-up:  Return in about 3 months (around 08/16/2022).  [place]

## 2022-05-18 NOTE — Patient Instructions (Signed)
Work on smoking cessation.  Medications refilled.  Follow up in 3 months.

## 2022-08-03 ENCOUNTER — Other Ambulatory Visit: Payer: Self-pay | Admitting: Family Medicine

## 2022-08-03 DIAGNOSIS — F411 Generalized anxiety disorder: Secondary | ICD-10-CM

## 2022-08-16 ENCOUNTER — Encounter: Payer: Self-pay | Admitting: Family Medicine

## 2022-08-16 ENCOUNTER — Ambulatory Visit (INDEPENDENT_AMBULATORY_CARE_PROVIDER_SITE_OTHER): Payer: Self-pay | Admitting: Family Medicine

## 2022-08-16 VITALS — BP 110/61 | HR 61 | Temp 97.5°F | Ht 72.0 in | Wt 379.0 lb

## 2022-08-16 DIAGNOSIS — M25562 Pain in left knee: Secondary | ICD-10-CM

## 2022-08-16 DIAGNOSIS — F411 Generalized anxiety disorder: Secondary | ICD-10-CM

## 2022-08-16 DIAGNOSIS — F900 Attention-deficit hyperactivity disorder, predominantly inattentive type: Secondary | ICD-10-CM

## 2022-08-16 MED ORDER — METHYLPHENIDATE HCL 10 MG PO TABS: 10.0000 mg | ORAL_TABLET | Freq: Every day | ORAL | 0 refills | Status: DC

## 2022-08-16 MED ORDER — METHYLPHENIDATE HCL ER (OSM) 54 MG PO TBCR: 54.0000 mg | EXTENDED_RELEASE_TABLET | ORAL | 0 refills | Status: DC

## 2022-08-16 MED ORDER — ESCITALOPRAM OXALATE 20 MG PO TABS: 20.0000 mg | ORAL_TABLET | Freq: Every day | ORAL | 3 refills | Status: DC

## 2022-08-16 NOTE — Assessment & Plan Note (Signed)
Improving.  Normal exam.  Supportive care.

## 2022-08-16 NOTE — Assessment & Plan Note (Signed)
Medications refilled.  If patient continues to not take on a regular basis will discontinue.

## 2022-08-16 NOTE — Progress Notes (Signed)
Subjective:  Patient ID: Clayton Jensen, male    DOB: 03/01/1981  Age: 42 y.o. MRN: [id number]  CC: Chief Complaint  Patient presents with   ADHD    HPI:  42 year old with the below mentioned past medical history presents for follow-up regarding ADHD.   Per the [facility] controlled substance database, he has not been taking this medication regularly.  Last filled in February.  Patient denies any difficulty or adverse effects from his medication.  He is requesting refill today.  Patient reports that he has had some recent posterior knee pain on the left side.  He states that this has been slowly improving.  He states that he hyperextended his knee approximately 1 month ago.  No swelling.  No joint line pain.  Patient Active Problem List   Diagnosis Date Noted   Left knee pain 08/16/2022   Prediabetes 05/18/2022   Hyperlipidemia 05/18/2022   Tobacco abuse 11/19/2021   Morbid obesity ([facility]) 04/07/2021   ADD (attention deficit disorder) 03/27/2020   History of empyema of pleura 08/30/2017   Chronic lower back pain    History of viral meningitis 04/06/2017    Social Hx   Social History   Socioeconomic History   Marital status: Married    Spouse name: Not on file   Number of children: Not on file   Years of education: Not on file   Highest education level: Not on file  Occupational History   Not on file  Tobacco Use   Smoking status: Light Smoker    Packs/day: 0.75    Years: 23.00    Additional pack years: 0.00    Total pack years: 17.25    Types: Cigarettes, Cigars   Smokeless tobacco: Never  Vaping Use   Vaping Use: Former  Substance and Sexual Activity   Alcohol use: Yes    Comment: 04/05/2017 "might have a beer q 2 months"   Drug use: No   Sexual activity: Yes  Other Topics Concern   Not on file  Social History Narrative   Not on file   Social Determinants of Health   Financial Resource Strain: Not on file  Food Insecurity: Not on file   Transportation Needs: Not on file  Physical Activity: Not on file  Stress: Not on file  Social Connections: Not on file    Review of Systems Per HPI  Objective:  BP 110/61   Pulse 61   Temp (!) 97.5 F (36.4 C)   Ht 6' (1.829 m)   Wt (!) 379 lb (171.9 kg)   SpO2 95%   BMI 51.40 kg/m      08/16/2022   10:14 AM 05/18/2022   10:34 AM 01/20/2022    8:12 AM  BP/Weight  Systolic BP 110 118   Diastolic BP 61 68   Wt. (Lbs) 379 376 367  BMI 51.4 kg/m2 50.99 kg/m2 49.77 kg/m2    Physical Exam Vitals and nursing note reviewed.  Constitutional:      General: He is not in acute distress.    Appearance: Normal appearance. He is obese.  HENT:     Head: Normocephalic and atraumatic.  Cardiovascular:     Rate and Rhythm: Normal rate and regular rhythm.  Pulmonary:     Effort: Pulmonary effort is normal.     Breath sounds: Normal breath sounds.  Musculoskeletal:     Comments: Left knee: No joint line tenderness.  No swelling.  No tenderness posteriorly.  Ligaments intact.  Neurological:  Mental Status: He is alert.  Psychiatric:     Comments: Flat affect.    Lab Results  Component Value Date   WBC 10.5 06/16/2021   HGB 15.5 06/16/2021   HCT 45.5 06/16/2021   PLT 156 06/16/2021   GLUCOSE 90 11/19/2021   CHOL 202 (H) 11/19/2021   TRIG 137 11/19/2021   HDL 44 11/19/2021   LDLCALC 133 (H) 11/19/2021   ALT 53 (H) 11/19/2021   AST 55 (H) 11/19/2021   NA 140 11/19/2021   K 4.3 11/19/2021   CL 100 11/19/2021   CREATININE 0.88 11/19/2021   BUN 11 11/19/2021   CO2 24 11/19/2021   TSH 3.859 04/04/2017   HGBA1C 6.2 (H) 11/19/2021     Assessment & Plan:   Problem List Items Addressed This Visit       Other   ADD (attention deficit disorder) - Primary    Medications refilled.  If patient continues to not take on a regular basis will discontinue.      Relevant Medications   methylphenidate (RITALIN) 10 MG tablet   Left knee pain    Improving.  Normal exam.   Supportive care.      Other Visit Diagnoses     Generalized anxiety disorder       Relevant Medications   escitalopram (LEXAPRO) 20 MG tablet       Meds ordered this encounter  Medications   escitalopram (LEXAPRO) 20 MG tablet    Sig: Take 1 tablet (20 mg total) by mouth daily.    Dispense:  90 tablet    Refill:  3   methylphenidate (RITALIN) 10 MG tablet    Sig: Take 1 tablet (10 mg total) by mouth daily. At 1:30    Dispense:  30 tablet    Refill:  0   methylphenidate (RITALIN) 10 MG tablet    Sig: Take 1 tablet (10 mg total) by mouth daily. At 1:30.    Dispense:  30 tablet    Refill:  0   methylphenidate (RITALIN) 10 MG tablet    Sig: Take 1 tablet (10 mg total) by mouth daily. At 1:30 pm.    Dispense:  30 tablet    Refill:  0   methylphenidate 54 MG PO CR tablet    Sig: Take 1 tablet (54 mg total) by mouth every morning.    Dispense:  30 tablet    Refill:  0   methylphenidate 54 MG PO CR tablet    Sig: Take 1 tablet (54 mg total) by mouth every morning.    Dispense:  30 tablet    Refill:  0   methylphenidate 54 MG PO CR tablet    Sig: Take 1 tablet (54 mg total) by mouth every morning.    Dispense:  30 tablet    Refill:  0    Follow-up:  Return in about 3 months (around 11/16/2022).  Everlene Other DO [facility]

## 2022-08-16 NOTE — Patient Instructions (Signed)
Medications refilled.   Follow up in 3 months.  Take care  Dr. Rhyann Berton  

## 2022-11-16 ENCOUNTER — Ambulatory Visit: Payer: 59 | Admitting: Family Medicine

## 2022-11-25 ENCOUNTER — Ambulatory Visit: Payer: 59 | Admitting: Family Medicine

## 2022-12-14 ENCOUNTER — Ambulatory Visit (INDEPENDENT_AMBULATORY_CARE_PROVIDER_SITE_OTHER): Payer: Self-pay | Admitting: Family Medicine

## 2022-12-14 VITALS — BP 128/78 | HR 62 | Temp 97.7°F | Ht 72.0 in | Wt 370.2 lb

## 2022-12-14 DIAGNOSIS — Z130 Encounter for screening for diseases of the blood and blood-forming organs and certain disorders involving the immune mechanism: Secondary | ICD-10-CM

## 2022-12-14 DIAGNOSIS — R7303 Prediabetes: Secondary | ICD-10-CM

## 2022-12-14 DIAGNOSIS — F411 Generalized anxiety disorder: Secondary | ICD-10-CM | POA: Insufficient documentation

## 2022-12-14 DIAGNOSIS — E785 Hyperlipidemia, unspecified: Secondary | ICD-10-CM

## 2022-12-14 DIAGNOSIS — F900 Attention-deficit hyperactivity disorder, predominantly inattentive type: Secondary | ICD-10-CM

## 2022-12-14 MED ORDER — METHYLPHENIDATE HCL ER (OSM) 54 MG PO TBCR: 54.0000 mg | EXTENDED_RELEASE_TABLET | ORAL | 0 refills | Status: DC

## 2022-12-14 MED ORDER — METHYLPHENIDATE HCL 10 MG PO TABS: 10.0000 mg | ORAL_TABLET | Freq: Every day | ORAL | 0 refills | Status: DC

## 2022-12-14 MED ORDER — ESCITALOPRAM OXALATE 20 MG PO TABS: 20.0000 mg | ORAL_TABLET | Freq: Every day | ORAL | 3 refills | Status: DC

## 2022-12-14 MED ORDER — METHYLPHENIDATE HCL 10 MG PO TABS
10.0000 mg | ORAL_TABLET | Freq: Every day | ORAL | 0 refills | Status: DC
Start: 2023-01-13 — End: 2023-05-31

## 2022-12-14 MED ORDER — METHYLPHENIDATE HCL 10 MG PO TABS
10.0000 mg | ORAL_TABLET | Freq: Every day | ORAL | 0 refills | Status: DC
Start: 2022-12-14 — End: 2023-05-31

## 2022-12-14 NOTE — Progress Notes (Signed)
Subjective:  Patient ID: Clayton Jensen, male    DOB: 09-19-1980  Age: 42 y.o. MRN: [id number]  CC: Follow up   HPI:  42 year old male with chronic low back pain, morbid obesity, tobacco abuse, hyperlipidemia, prediabetes, ADD presents for follow-up.  Patient overall is doing well.  He has no complaints at this time.  Needs medication refills regarding his ADD medication.  No side effects or concerns.  Last refill was in August per the database.  Patient continues to smoke.  Advised to consider treatment.  Patient needs labs for reassessment of LFTs and lipids.  Also needs A1c to assess prediabetes.  Patient Active Problem List   Diagnosis Date Noted   Generalized anxiety disorder 12/14/2022   Prediabetes 05/18/2022   Hyperlipidemia 05/18/2022   Tobacco abuse 11/19/2021   Morbid obesity (HCC) 04/07/2021   ADD (attention deficit disorder) 03/27/2020   History of empyema of pleura 08/30/2017   Chronic lower back pain    History of viral meningitis 04/06/2017    Social Hx   Social History   Socioeconomic History   Marital status: Married    Spouse name: Not on file   Number of children: Not on file   Years of education: Not on file   Highest education level: Not on file  Occupational History   Not on file  Tobacco Use   Smoking status: Light Smoker    Current packs/day: 0.75    Average packs/day: 0.8 packs/day for 23.0 years (17.3 ttl pk-yrs)    Types: Cigarettes, Cigars   Smokeless tobacco: Never  Vaping Use   Vaping status: Former  Substance and Sexual Activity   Alcohol use: Yes    Comment: 04/05/2017 "might have a beer q 2 months"   Drug use: No   Sexual activity: Yes  Other Topics Concern   Not on file  Social History Narrative   Not on file   Social Determinants of Health   Financial Resource Strain: Not on file  Food Insecurity: Not on file  Transportation Needs: Not on file  Physical Activity: Not on file  Stress: Not on file  Social Connections:  Not on file    Review of Systems  Constitutional: Negative.   Respiratory: Negative.    Cardiovascular: Negative.     Objective:  BP 128/78   Pulse 62   Temp 97.7 F (36.5 C)   Ht 6' (1.829 m)   Wt (!) 370 lb 3.2 oz (167.9 kg)   SpO2 98%   BMI 50.21 kg/m      12/14/2022    8:36 AM 08/16/2022   10:14 AM 05/18/2022   10:34 AM  BP/Weight  Systolic BP 128 110 118  Diastolic BP 78 61 68  Wt. (Lbs) 370.2 379 376  BMI 50.21 kg/m2 51.4 kg/m2 50.99 kg/m2    Physical Exam Vitals reviewed.  Constitutional:      Appearance: Normal appearance. He is obese.  Cardiovascular:     Rate and Rhythm: Normal rate and regular rhythm.  Pulmonary:     Effort: Pulmonary effort is normal.     Breath sounds: Normal breath sounds. No wheezing or rales.  Abdominal:     General: There is no distension.     Palpations: Abdomen is soft.     Tenderness: There is no abdominal tenderness.  Neurological:     Mental Status: He is alert.  Psychiatric:     Comments: Flat affect.    Lab Results  Component Value Date  WBC 10.5 06/16/2021   HGB 15.5 06/16/2021   HCT 45.5 06/16/2021   PLT 156 06/16/2021   GLUCOSE 90 11/19/2021   CHOL 202 (H) 11/19/2021   TRIG 137 11/19/2021   HDL 44 11/19/2021   LDLCALC 133 (H) 11/19/2021   ALT 53 (H) 11/19/2021   AST 55 (H) 11/19/2021   NA 140 11/19/2021   K 4.3 11/19/2021   CL 100 11/19/2021   CREATININE 0.88 11/19/2021   BUN 11 11/19/2021   CO2 24 11/19/2021   TSH 3.859 04/04/2017   HGBA1C 6.2 (H) 11/19/2021     Assessment & Plan:   Problem List Items Addressed This Visit       Other   Morbid obesity (HCC)   Relevant Medications   methylphenidate (RITALIN) 10 MG tablet (Start on 02/13/2023)   methylphenidate 54 MG PO CR tablet (Start on 02/13/2023)   methylphenidate 54 MG PO CR tablet (Start on 01/13/2023)   methylphenidate 54 MG PO CR tablet   methylphenidate (RITALIN) 10 MG tablet (Start on 01/13/2023)   methylphenidate (RITALIN) 10 MG  tablet   Other Relevant Orders   CMP14+EGFR   Hyperlipidemia   Relevant Orders   Lipid panel   Prediabetes   Relevant Orders   Hemoglobin A1c   ADD (attention deficit disorder) - Primary    Stable.  Medications refilled.  [place] controlled substance database reviewed.      Relevant Medications   methylphenidate (RITALIN) 10 MG tablet (Start on 02/13/2023)   methylphenidate 54 MG PO CR tablet (Start on 02/13/2023)   methylphenidate 54 MG PO CR tablet (Start on 01/13/2023)   methylphenidate 54 MG PO CR tablet   methylphenidate (RITALIN) 10 MG tablet (Start on 01/13/2023)   methylphenidate (RITALIN) 10 MG tablet   Generalized anxiety disorder    Stable.  Lexapro refilled.      Relevant Medications   escitalopram (LEXAPRO) 20 MG tablet   Other Visit Diagnoses     Screening for deficiency anemia       Relevant Orders   CBC       Meds ordered this encounter  Medications   escitalopram (LEXAPRO) 20 MG tablet    Sig: Take 1 tablet (20 mg total) by mouth daily.    Dispense:  90 tablet    Refill:  3   methylphenidate (RITALIN) 10 MG tablet    Sig: Take 1 tablet (10 mg total) by mouth daily. At 1:30 pm.    Dispense:  30 tablet    Refill:  0   methylphenidate 54 MG PO CR tablet    Sig: Take 1 tablet (54 mg total) by mouth every morning.    Dispense:  30 tablet    Refill:  0   methylphenidate 54 MG PO CR tablet    Sig: Take 1 tablet (54 mg total) by mouth every morning.    Dispense:  30 tablet    Refill:  0   methylphenidate 54 MG PO CR tablet    Sig: Take 1 tablet (54 mg total) by mouth every morning.    Dispense:  30 tablet    Refill:  0   methylphenidate (RITALIN) 10 MG tablet    Sig: Take 1 tablet (10 mg total) by mouth daily. At 1:30 pm.    Dispense:  30 tablet    Refill:  0   methylphenidate (RITALIN) 10 MG tablet    Sig: Take 1 tablet (10 mg total) by mouth daily. At 1:30 pm.  Dispense:  30 tablet    Refill:  0    Follow-up:  Return in about 3  months (around 03/15/2023).  Everlene Other DO [facility]

## 2022-12-14 NOTE — Assessment & Plan Note (Signed)
Stable.  Lexapro refilled.

## 2022-12-14 NOTE — Patient Instructions (Signed)
Labs ordered.  Medications refilled.  Work on smoking cessation.  Follow up in 3 months.

## 2022-12-14 NOTE — Assessment & Plan Note (Signed)
Stable.  Medications refilled.  [place] controlled substance database reviewed.

## 2023-05-27 ENCOUNTER — Other Ambulatory Visit: Payer: Self-pay

## 2023-05-27 DIAGNOSIS — F900 Attention-deficit hyperactivity disorder, predominantly inattentive type: Secondary | ICD-10-CM

## 2023-05-27 NOTE — Telephone Encounter (Signed)
Pt has been scheduled for an appt next week . Requesting medication refill pended for approval.

## 2023-05-29 MED ORDER — METHYLPHENIDATE HCL ER (OSM) 54 MG PO TBCR
54.0000 mg | EXTENDED_RELEASE_TABLET | ORAL | 0 refills | Status: DC
Start: 2023-05-29 — End: 2023-05-31

## 2023-05-31 ENCOUNTER — Ambulatory Visit (INDEPENDENT_AMBULATORY_CARE_PROVIDER_SITE_OTHER): Payer: Self-pay | Admitting: Family Medicine

## 2023-05-31 DIAGNOSIS — F900 Attention-deficit hyperactivity disorder, predominantly inattentive type: Secondary | ICD-10-CM

## 2023-05-31 MED ORDER — METHYLPHENIDATE HCL ER (OSM) 54 MG PO TBCR
54.0000 mg | EXTENDED_RELEASE_TABLET | ORAL | 0 refills | Status: DC
Start: 2023-06-28 — End: 2023-12-01

## 2023-05-31 MED ORDER — METHYLPHENIDATE HCL 10 MG PO TABS
10.0000 mg | ORAL_TABLET | Freq: Every day | ORAL | 0 refills | Status: DC
Start: 2023-07-29 — End: 2023-09-23

## 2023-05-31 MED ORDER — METHYLPHENIDATE HCL ER (OSM) 54 MG PO TBCR
54.0000 mg | EXTENDED_RELEASE_TABLET | ORAL | 0 refills | Status: DC
Start: 2023-05-31 — End: 2023-12-01

## 2023-05-31 MED ORDER — METHYLPHENIDATE HCL 10 MG PO TABS
10.0000 mg | ORAL_TABLET | Freq: Every day | ORAL | 0 refills | Status: DC
Start: 2023-05-31 — End: 2023-12-01

## 2023-05-31 MED ORDER — METHYLPHENIDATE HCL 10 MG PO TABS
10.0000 mg | ORAL_TABLET | Freq: Every day | ORAL | 0 refills | Status: DC
Start: 2023-06-28 — End: 2023-12-01

## 2023-05-31 MED ORDER — METHYLPHENIDATE HCL ER (OSM) 54 MG PO TBCR
54.0000 mg | EXTENDED_RELEASE_TABLET | ORAL | 0 refills | Status: DC
Start: 2023-07-29 — End: 2023-12-01

## 2023-05-31 NOTE — Patient Instructions (Signed)
 Meds refilled.  Follow up in 6 months.

## 2023-06-01 NOTE — Progress Notes (Signed)
 Subjective:  Patient ID: Clayton Jensen, male    DOB: [birth year]  Age: 43 y.o. MRN: [id number]  CC:   Chief Complaint  Patient presents with   ADHD    Follow up self pay    HPI:  43 year old male presents for follow-up.  Patient states that he is doing well.  No side effects from his ADHD medication.  He said no issues getting the medication.  He is in need of refill.  He has no other complaints or concerns at this time.  Patient Active Problem List   Diagnosis Date Noted   Generalized anxiety disorder 12/14/2022   Prediabetes 05/18/2022   Hyperlipidemia 05/18/2022   Tobacco abuse 11/19/2021   Morbid obesity (HCC) 04/07/2021   ADD (attention deficit disorder) 03/27/2020   History of empyema of pleura 08/30/2017   Chronic lower back pain    History of viral meningitis 04/06/2017    Social Hx   Social History   Socioeconomic History   Marital status: Married    Spouse name: Not on file   Number of children: Not on file   Years of education: Not on file   Highest education level: Not on file  Occupational History   Not on file  Tobacco Use   Smoking status: Light Smoker    Current packs/day: 0.75    Average packs/day: 0.8 packs/day for 23.0 years (17.3 ttl pk-yrs)    Types: Cigarettes, Cigars   Smokeless tobacco: Never  Vaping Use   Vaping status: Former  Substance and Sexual Activity   Alcohol use: Yes    Comment: 04/05/2017 "might have a beer q 2 months"   Drug use: No   Sexual activity: Yes  Other Topics Concern   Not on file  Social History Narrative   Not on file   Social Drivers of Health   Financial Resource Strain: Not on file  Food Insecurity: Not on file  Transportation Needs: Not on file  Physical Activity: Not on file  Stress: Not on file  Social Connections: Not on file    Review of Systems Per HPI  Objective:  BP 117/70   Pulse 84   Temp 98.7 F (37.1 C)   Ht 6' (1.829 m)   Wt (!) 363 lb (164.7 kg)   SpO2 95%   BMI 49.23 kg/m       05/31/2023    2:32 PM 12/14/2022    8:36 AM 08/16/2022   10:14 AM  BP/Weight  Systolic BP 117 128 110  Diastolic BP 70 78 61  Wt. (Lbs) 363 370.2 379  BMI 49.23 kg/m2 50.21 kg/m2 51.4 kg/m2    Physical Exam Vitals and nursing note reviewed.  Constitutional:      General: He is not in acute distress.    Appearance: Normal appearance. He is obese.  HENT:     Head: Normocephalic and atraumatic.  Eyes:     General:        Right eye: No discharge.        Left eye: No discharge.     Conjunctiva/sclera: Conjunctivae normal.  Cardiovascular:     Rate and Rhythm: Normal rate and regular rhythm.  Pulmonary:     Effort: Pulmonary effort is normal.     Breath sounds: Normal breath sounds. No wheezing, rhonchi or rales.  Neurological:     Mental Status: He is alert.  Psychiatric:        Mood and Affect: Mood normal.  Behavior: Behavior normal.     Lab Results  Component Value Date   WBC 10.5 06/16/2021   HGB 15.5 06/16/2021   HCT 45.5 06/16/2021   PLT 156 06/16/2021   GLUCOSE 90 11/19/2021   CHOL 202 (H) 11/19/2021   TRIG 137 11/19/2021   HDL 44 11/19/2021   LDLCALC 133 (H) 11/19/2021   ALT 53 (H) 11/19/2021   AST 55 (H) 11/19/2021   NA 140 11/19/2021   K 4.3 11/19/2021   CL 100 11/19/2021   CREATININE 0.88 11/19/2021   BUN 11 11/19/2021   CO2 24 11/19/2021   TSH 3.859 04/04/2017   HGBA1C 6.2 (H) 11/19/2021     Assessment & Plan:  Attention deficit hyperactivity disorder (ADHD), predominantly inattentive type Assessment & Plan: Stable.  Continue current medications.  Refilled today.  Orders: -     Methylphenidate HCl ER (OSM); Take 1 tablet (54 mg total) by mouth every morning.  Dispense: 30 tablet; Refill: 0 -     Methylphenidate HCl ER (OSM); Take 1 tablet (54 mg total) by mouth every morning.  Dispense: 30 tablet; Refill: 0 -     Methylphenidate HCl ER (OSM); Take 1 tablet (54 mg total) by mouth every morning.  Dispense: 30 tablet; Refill: 0 -      Methylphenidate HCl; Take 1 tablet (10 mg total) by mouth daily. At 1:30 pm.  Dispense: 30 tablet; Refill: 0 -     Methylphenidate HCl; Take 1 tablet (10 mg total) by mouth daily. At 1:30 pm.  Dispense: 30 tablet; Refill: 0 -     Methylphenidate HCl; Take 1 tablet (10 mg total) by mouth daily. At 1:30 pm.  Dispense: 30 tablet; Refill: 0    Follow-up:  3-6 months  Milton Streicher Adriana Simas DO [facility]

## 2023-06-01 NOTE — Assessment & Plan Note (Signed)
 Stable.  Continue current medications.  Refilled today.

## 2023-09-22 ENCOUNTER — Other Ambulatory Visit: Payer: Self-pay | Admitting: Family Medicine

## 2023-09-23 ENCOUNTER — Other Ambulatory Visit: Payer: Self-pay

## 2023-09-23 DIAGNOSIS — F900 Attention-deficit hyperactivity disorder, predominantly inattentive type: Secondary | ICD-10-CM

## 2023-09-23 MED ORDER — METHYLPHENIDATE HCL 10 MG PO TABS
10.0000 mg | ORAL_TABLET | Freq: Every day | ORAL | 0 refills | Status: DC
Start: 2023-09-23 — End: 2023-12-01

## 2023-12-01 ENCOUNTER — Encounter: Payer: Self-pay | Admitting: Family Medicine

## 2023-12-01 ENCOUNTER — Ambulatory Visit (INDEPENDENT_AMBULATORY_CARE_PROVIDER_SITE_OTHER): Payer: Self-pay | Admitting: Family Medicine

## 2023-12-01 VITALS — BP 138/83 | HR 75 | Temp 97.5°F | Ht 72.0 in | Wt 369.0 lb

## 2023-12-01 DIAGNOSIS — R7303 Prediabetes: Secondary | ICD-10-CM

## 2023-12-01 DIAGNOSIS — E785 Hyperlipidemia, unspecified: Secondary | ICD-10-CM

## 2023-12-01 DIAGNOSIS — Z1211 Encounter for screening for malignant neoplasm of colon: Secondary | ICD-10-CM

## 2023-12-01 DIAGNOSIS — Z130 Encounter for screening for diseases of the blood and blood-forming organs and certain disorders involving the immune mechanism: Secondary | ICD-10-CM

## 2023-12-01 DIAGNOSIS — F900 Attention-deficit hyperactivity disorder, predominantly inattentive type: Secondary | ICD-10-CM

## 2023-12-01 MED ORDER — METHYLPHENIDATE HCL 10 MG PO TABS: 10.0000 mg | ORAL_TABLET | Freq: Every day | ORAL | 0 refills | Status: DC

## 2023-12-01 MED ORDER — METHYLPHENIDATE HCL ER (OSM) 54 MG PO TBCR: 54.0000 mg | EXTENDED_RELEASE_TABLET | ORAL | 0 refills | Status: AC

## 2023-12-01 MED ORDER — METHYLPHENIDATE HCL ER (OSM) 54 MG PO TBCR: 54.0000 mg | EXTENDED_RELEASE_TABLET | ORAL | 0 refills | Status: DC

## 2023-12-01 MED ORDER — METHYLPHENIDATE HCL 10 MG PO TABS: 10.0000 mg | ORAL_TABLET | Freq: Every day | ORAL | 0 refills | Status: AC

## 2023-12-01 NOTE — Patient Instructions (Signed)
Follow up in 3-6 months    Labs today

## 2023-12-05 NOTE — Progress Notes (Signed)
 But  Subjective:  Patient ID: Clayton Jensen, male    DOB: 05-05-1980  Age: 43 y.o. MRN: [id number]  CC:   Chief Complaint  Patient presents with   ADHD    HPI:  43 year old male presents for follow-up.  Patient reports that he is doing well.  He is requesting refills on his ADHD medications.  He has not been taking his medication regularly as his last refill was on 5/13.  Denies chest pain, SOB.   Needs routine labs.   Needs colon cancer screening. Amendable to Cologuard.   Patient Active Problem List   Diagnosis Date Noted   Generalized anxiety disorder 12/14/2022   Prediabetes 05/18/2022   Hyperlipidemia 05/18/2022   Tobacco abuse 11/19/2021   Morbid obesity (HCC) 04/07/2021   ADD (attention deficit disorder) 03/27/2020   History of empyema of pleura 08/30/2017   Chronic lower back pain    History of viral meningitis 04/06/2017    Social Hx   Social History   Socioeconomic History   Marital status: Married    Spouse name: Not on file   Number of children: Not on file   Years of education: Not on file   Highest education level: Not on file  Occupational History   Not on file  Tobacco Use   Smoking status: Light Smoker    Current packs/day: 0.75    Average packs/day: 0.8 packs/day for 23.0 years (17.3 ttl pk-yrs)    Types: Cigarettes, Cigars   Smokeless tobacco: Never  Vaping Use   Vaping status: Former  Substance and Sexual Activity   Alcohol use: Yes    Comment: 04/05/2017 might have a beer q 2 months   Drug use: No   Sexual activity: Yes  Other Topics Concern   Not on file  Social History Narrative   Not on file   Social Drivers of Health   Financial Resource Strain: Not on file  Food Insecurity: Not on file  Transportation Needs: Not on file  Physical Activity: Not on file  Stress: Not on file  Social Connections: Not on file    Review of Systems Per HPI  Objective:  BP 138/83   Pulse 75   Temp (!) 97.5 F (36.4 C)   Ht 6' (1.829 m)    Wt (!) 369 lb (167.4 kg)   SpO2 95%   BMI 50.05 kg/m      12/01/2023    2:37 PM 05/31/2023    2:32 PM 12/14/2022    8:36 AM  BP/Weight  Systolic BP 138 117 128  Diastolic BP 83 70 78  Wt. (Lbs) 369 363 370.2  BMI 50.05 kg/m2 49.23 kg/m2 50.21 kg/m2    Physical Exam Vitals and nursing note reviewed.  Constitutional:      General: He is not in acute distress.    Appearance: Normal appearance. He is obese.  HENT:     Head: Normocephalic and atraumatic.  Cardiovascular:     Rate and Rhythm: Normal rate and regular rhythm.  Pulmonary:     Effort: Pulmonary effort is normal.     Breath sounds: Normal breath sounds. No wheezing, rhonchi or rales.  Neurological:     Mental Status: He is alert.  Psychiatric:        Mood and Affect: Mood normal.        Behavior: Behavior normal.     Lab Results  Component Value Date   WBC 10.5 06/16/2021   HGB 15.5 06/16/2021   HCT 45.5  06/16/2021   PLT 156 06/16/2021   GLUCOSE 90 11/19/2021   CHOL 202 (H) 11/19/2021   TRIG 137 11/19/2021   HDL 44 11/19/2021   LDLCALC 133 (H) 11/19/2021   ALT 53 (H) 11/19/2021   AST 55 (H) 11/19/2021   NA 140 11/19/2021   K 4.3 11/19/2021   CL 100 11/19/2021   CREATININE 0.88 11/19/2021   BUN 11 11/19/2021   CO2 24 11/19/2021   TSH 3.859 04/04/2017   HGBA1C 6.2 (H) 11/19/2021     Assessment & Plan:  Attention deficit hyperactivity disorder (ADHD), predominantly inattentive type Assessment & Plan: Stable. Has not had refill since May. Meds refilled today. If continues to not fill regularly, will discuss discontinuation.  Orders: -     Methylphenidate  HCl; Take 1 tablet (10 mg total) by mouth daily. At 1:30 pm.  Dispense: 30 tablet; Refill: 0 -     Methylphenidate  HCl; Take 1 tablet (10 mg total) by mouth daily. At 1:30 pm.  Dispense: 30 tablet; Refill: 0 -     Methylphenidate  HCl; Take 1 tablet (10 mg total) by mouth daily. At 1:30 pm.  Dispense: 30 tablet; Refill: 0 -     Methylphenidate  HCl  ER (OSM); Take 1 tablet (54 mg total) by mouth every morning.  Dispense: 30 tablet; Refill: 0 -     Methylphenidate  HCl ER (OSM); Take 1 tablet (54 mg total) by mouth every morning.  Dispense: 30 tablet; Refill: 0 -     Methylphenidate  HCl ER (OSM); Take 1 tablet (54 mg total) by mouth every morning.  Dispense: 30 tablet; Refill: 0  Hyperlipidemia, unspecified hyperlipidemia type -     Lipid panel  Prediabetes -     CMP14+EGFR -     Hemoglobin A1c  Screening for deficiency anemia -     CBC  Colon cancer screening -     Cologuard    Follow-up:  3-6 months  Alizah Sills Bluford DO [facility]

## 2023-12-05 NOTE — Assessment & Plan Note (Addendum)
 Stable. Has not had refill since May. Meds refilled today. If continues to not fill regularly, will discuss discontinuation.

## 2023-12-09 ENCOUNTER — Ambulatory Visit: Payer: Self-pay

## 2023-12-09 ENCOUNTER — Encounter: Payer: Self-pay | Admitting: Emergency Medicine

## 2023-12-09 ENCOUNTER — Telehealth: Payer: Self-pay | Admitting: Physician Assistant

## 2023-12-09 ENCOUNTER — Ambulatory Visit
Admission: EM | Admit: 2023-12-09 | Discharge: 2023-12-09 | Disposition: A | Discharge status: Home / Self Care | Payer: Self-pay | Attending: Family Medicine | Admitting: Family Medicine

## 2023-12-09 DIAGNOSIS — R300 Dysuria: Secondary | ICD-10-CM

## 2023-12-09 DIAGNOSIS — R310 Gross hematuria: Secondary | ICD-10-CM

## 2023-12-09 DIAGNOSIS — N390 Urinary tract infection, site not specified: Secondary | ICD-10-CM | POA: Insufficient documentation

## 2023-12-09 LAB — POCT URINE DIPSTICK
Bilirubin, UA: NEGATIVE
Glucose, UA: NEGATIVE mg/dL
Ketones, POC UA: NEGATIVE mg/dL
Nitrite, UA: POSITIVE — AB
POC PROTEIN,UA: 100 — AB
Spec Grav, UA: 1.025 (ref 1.010–1.025)
Urobilinogen, UA: 1 U/dL
pH, UA: 6 (ref 5.0–8.0)

## 2023-12-09 MED ORDER — CIPROFLOXACIN HCL 500 MG PO TABS: 500.0000 mg | ORAL_TABLET | Freq: Two times a day (BID) | ORAL | 0 refills | Status: DC

## 2023-12-09 NOTE — ED Triage Notes (Addendum)
 Pain on urination since yesterday.  States groin area feels sore.  States has seen small blood clots in urine.

## 2023-12-09 NOTE — ED Provider Notes (Signed)
 [facility]    CSN: [id number] Arrival date & time: 12/09/23  1616      History   Chief Complaint No chief complaint on file.   HPI Clayton Jensen is a 43 y.o. male.   Patient presenting today with 1 day history of dysuria, groin soreness, small blood clots in urine.  Denies fever, chills, nausea, vomiting, flank pain.  Denies past history of similar symptoms.  No new medications, lifestyle changes recently, concern for STIs.    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   OSA (obstructive sleep apnea)    clinical diagnosis, not officially diagnosed per family   PNA (pneumonia) 08/29/2017   Sepsis (HCC) 08/29/2017   Vertigo     Patient Active Problem List   Diagnosis Date Noted   Generalized anxiety disorder 12/14/2022   Prediabetes 05/18/2022   Hyperlipidemia 05/18/2022   Tobacco abuse 11/19/2021   Morbid obesity (HCC) 04/07/2021   ADD (attention deficit disorder) 03/27/2020   History of empyema of pleura 08/30/2017   Chronic lower back pain    History of viral meningitis 04/06/2017    Past Surgical History:  Procedure Laterality Date   KNEE ARTHROSCOPY Left    VIDEO ASSISTED THORACOSCOPY (VATS)/EMPYEMA Right 08/30/2017   Procedure: VIDEO ASSISTED THORACOSCOPY (VATS)/EMPYEMA;  Surgeon: Lucas Dorise POUR, MD;  Location: [facility] OR;  Service: Thoracic;  Laterality: Right;       Home Medications    Prior to Admission medications   Medication Sig Start Date End Date Taking? Authorizing Provider  ciprofloxacin  (CIPRO ) 500 MG tablet Take 1 tablet (500 mg total) by mouth 2 (two) times daily. 12/09/23  Yes Stuart Vernell Norris, PA-C  methylphenidate  (RITALIN ) 10 MG tablet Take 1 tablet (10 mg total) by mouth daily. At 1:30 pm. 01/31/24   Cook, Jayce G, DO  methylphenidate   (RITALIN ) 10 MG tablet Take 1 tablet (10 mg total) by mouth daily. At 1:30 pm. 01/01/24   Cook, Jayce G, DO  methylphenidate  (RITALIN ) 10 MG tablet Take 1 tablet (10 mg total) by mouth daily. At 1:30 pm. 12/01/23   Cook, Jayce G, DO  methylphenidate  54 MG PO CR tablet Take 1 tablet (54 mg total) by mouth every morning. 01/31/24   Cook, Jayce G, DO  methylphenidate  54 MG PO CR tablet Take 1 tablet (54 mg total) by mouth every morning. 01/01/24   Cook, Jayce G, DO  methylphenidate  54 MG PO CR tablet Take 1 tablet (54 mg total) by mouth every morning. 12/01/23   Bluford Jacqulyn MATSU, DO    Family History Family History  Problem Relation Age of Onset   COPD Mother    Atrial fibrillation Mother    Diabetes Mother    Diabetes Father     Social History Social History   Tobacco Use   Smoking status: Light Smoker    Current packs/day: 0.75    Average packs/day: 0.8 packs/day for 23.0 years (17.3 ttl pk-yrs)    Types: Cigarettes, Cigars   Smokeless tobacco: Never  Vaping Use   Vaping status: Former  Substance Use Topics   Alcohol use: Yes    Comment: 04/05/2017 might have a beer q 2 months   Drug use: No     Allergies   Celery oil  Review of Systems Review of Systems Per HPI  Physical Exam Triage Vital Signs ED Triage Vitals  Encounter Vitals Group     BP 12/09/23 1628 (!) 149/85     Girls Systolic BP Percentile --      Girls Diastolic BP Percentile --      Boys Systolic BP Percentile --      Boys Diastolic BP Percentile --      Pulse Rate 12/09/23 1628 86     Resp 12/09/23 1628 18     Temp 12/09/23 1628 99.8 F (37.7 C)     Temp Source 12/09/23 1628 Oral     SpO2 12/09/23 1628 96 %     Weight --      Height --      Head Circumference --      Peak Flow --      Pain Score 12/09/23 1629 2     Pain Loc --      Pain Education --      Exclude from Growth Chart --    No data found.  Updated Vital Signs BP (!) 149/85 (BP Location: Right Arm)   Pulse 86   Temp 99.8 F (37.7  C) (Oral)   Resp 18   SpO2 96%   Visual Acuity Right Eye Distance:   Left Eye Distance:   Bilateral Distance:    Right Eye Near:   Left Eye Near:    Bilateral Near:     Physical Exam Vitals and nursing note reviewed.  Constitutional:      Appearance: Normal appearance.  HENT:     Head: Atraumatic.  Eyes:     Extraocular Movements: Extraocular movements intact.     Conjunctiva/sclera: Conjunctivae normal.  Cardiovascular:     Rate and Rhythm: Normal rate.  Pulmonary:     Effort: Pulmonary effort is normal.  Abdominal:     General: Bowel sounds are normal. There is no distension.     Palpations: Abdomen is soft.     Tenderness: There is no abdominal tenderness. There is no right CVA tenderness, left CVA tenderness or guarding.  Musculoskeletal:        General: Normal range of motion.     Cervical back: Normal range of motion and neck supple.  Skin:    General: Skin is warm and dry.  Neurological:     Mental Status: He is oriented to person, place, and time.  Psychiatric:        Mood and Affect: Mood normal.        Thought Content: Thought content normal.        Judgment: Judgment normal.      UC Treatments / Results  Labs (all labs ordered are listed, but only abnormal results are displayed) Labs Reviewed  POCT URINE DIPSTICK - Abnormal; Notable for the following components:      Result Value   Color, UA straw (*)    Clarity, UA cloudy (*)    Blood, UA large (*)    POC PROTEIN,UA =100 (*)    Nitrite, UA Positive (*)    Leukocytes, UA Trace (*)    All other components within normal limits  URINE CULTURE    EKG   Radiology No results found.  Procedures Procedures (including critical care time)  Medications Ordered in UC Medications - No data to display  Initial Impression / Assessment and Plan / UC Course  I have reviewed the triage vital signs and the nursing notes.  Pertinent  labs & imaging results that were available during my care of the  patient were reviewed by me and considered in my medical decision making (see chart for details).     Urinalysis today with evidence of urinary tract infection, urine culture pending, treat with Cipro , fluids, supportive over-the-counter medications and home care.  Return for worsening symptoms.  Final Clinical Impressions(s) / UC Diagnoses   Final diagnoses:  Acute lower UTI   Discharge Instructions   None    ED Prescriptions     Medication Sig Dispense Auth. Provider   ciprofloxacin  (CIPRO ) 500 MG tablet Take 1 tablet (500 mg total) by mouth 2 (two) times daily. 14 tablet Stuart Vernell Norris, [place]      PDMP not reviewed this encounter.   Stuart Vernell Norris, [place] 12/09/23 1752

## 2023-12-09 NOTE — Progress Notes (Signed)
 E-Visit for Urinary Problems  Based on what you shared with me, I feel your condition warrants further evaluation and I recommend that you be seen for a face to face office visit.  Male bladder infections are not very common.  We worry about prostate or kidney conditions.  The standard of care is to examine the abdomen and kidneys, and to do a urine and blood test to make sure that something more serious is not going on.  We recommend that you see a provider today.  If your doctor's office is closed [facility] has the following Urgent Cares:   NOTE: There will be NO CHARGE for this E-Visit   If you are having a true medical emergency, please call 911.     For an urgent face to face visit, [facility] has multiple urgent care centers for your convenience.  Click the link below for the full list of locations and hours, walk-in wait times, appointment scheduling options and driving directions:  Urgent Care - [place], [place], [place], [place], [place], [place]  [place]     Your MyChart E-visit questionnaire answers were reviewed by a board certified advanced clinical practitioner to complete your personal care plan based on your specific symptoms.  Thank you for using e-Visits.        I have spent 5 minutes in review of e-visit questionnaire, review and updating patient chart, medical decision making and response to patient.   Delon CHRISTELLA Dickinson, PA-C

## 2023-12-09 NOTE — Telephone Encounter (Signed)
 FYI Only or Action Required?: FYI only for provider.  Patient was last seen in primary care on 12/01/2023 by Cook, Jayce G, DO.  Called Nurse Triage reporting Urinary Frequency.  Symptoms began yesterday.  Interventions attempted: Nothing.  Symptoms are: gradually worsening.  Triage Disposition: See Physician Within 24 Hours  Patient/caregiver understands and will follow disposition?: Yes      Copied from CRM [id number]. Topic: Clinical - Red Word Triage >> Dec 09, 2023  2:07 PM Delon HERO wrote: Red Word that prompted transfer to Nurse Triage: Patient is call to report pain with urination. Reason for Disposition  All other males with painful urination  Answer Assessment - Initial Assessment Questions Patient has had extreme urinary frequency with symptoms. No appointments available in office for today. Patient denies flank pain, penis discharge, scrotal pain, or blood in urine. Pt referred to [facility] at [facility]. Pt states he will go to UC now and walk in.   1. SEVERITY: How bad is the pain?  (e.g., Scale 1-10; mild, moderate, or severe)     7 2. FREQUENCY: How many times have you had painful urination today?      10 3. PATTERN: Is pain present every time you urinate or just sometimes?      Every time  4. ONSET: When did the painful urination start?      Yesterday  5. FEVER: Do you have a fever? If Yes, ask: What is your temperature, how was it measured, and when did it start?     Denies  6. PAST UTI: Have you had a urine infection before? If Yes, ask: When was the last time? and What happened that time?      No  7. CAUSE: What do you think is causing the painful urination?      Unsure  8. OTHER SYMPTOMS: Do you have any other symptoms? (e.g., flank pain, penis discharge, scrotal pain, blood in urine)     Urinary frequency  Protocols used: Urination Pain - Male-A-AH

## 2023-12-13 ENCOUNTER — Ambulatory Visit (HOSPITAL_COMMUNITY): Payer: Self-pay

## 2023-12-13 LAB — URINE CULTURE: Culture: >=100000 — AB

## 2024-01-31 ENCOUNTER — Emergency Department (HOSPITAL_COMMUNITY): Payer: Self-pay

## 2024-01-31 ENCOUNTER — Other Ambulatory Visit: Payer: Self-pay

## 2024-01-31 ENCOUNTER — Inpatient Hospital Stay (HOSPITAL_COMMUNITY)
Admission: EM | Admit: 2024-01-31 | Discharge: 2024-02-01 | DRG: 065 | Disposition: A | Discharge status: Home / Self Care | Payer: Self-pay | Attending: Hospitalist | Admitting: Hospitalist

## 2024-01-31 ENCOUNTER — Encounter (HOSPITAL_COMMUNITY): Payer: Self-pay

## 2024-01-31 DIAGNOSIS — I6389 Other cerebral infarction: Principal | ICD-10-CM | POA: Diagnosis present

## 2024-01-31 DIAGNOSIS — F909 Attention-deficit hyperactivity disorder, unspecified type: Secondary | ICD-10-CM

## 2024-01-31 DIAGNOSIS — Z8661 Personal history of infections of the central nervous system: Secondary | ICD-10-CM

## 2024-01-31 DIAGNOSIS — Z833 Family history of diabetes mellitus: Secondary | ICD-10-CM

## 2024-01-31 DIAGNOSIS — R200 Anesthesia of skin: Secondary | ICD-10-CM

## 2024-01-31 DIAGNOSIS — G8194 Hemiplegia, unspecified affecting left nondominant side: Principal | ICD-10-CM | POA: Diagnosis present

## 2024-01-31 DIAGNOSIS — R7303 Prediabetes: Secondary | ICD-10-CM | POA: Diagnosis present

## 2024-01-31 DIAGNOSIS — Z87442 Personal history of urinary calculi: Secondary | ICD-10-CM

## 2024-01-31 DIAGNOSIS — R739 Hyperglycemia, unspecified: Secondary | ICD-10-CM

## 2024-01-31 DIAGNOSIS — E785 Hyperlipidemia, unspecified: Secondary | ICD-10-CM | POA: Diagnosis present

## 2024-01-31 DIAGNOSIS — F17210 Nicotine dependence, cigarettes, uncomplicated: Secondary | ICD-10-CM | POA: Diagnosis present

## 2024-01-31 DIAGNOSIS — I639 Cerebral infarction, unspecified: Secondary | ICD-10-CM | POA: Diagnosis present

## 2024-01-31 DIAGNOSIS — Z888 Allergy status to other drugs, medicaments and biological substances status: Secondary | ICD-10-CM

## 2024-01-31 DIAGNOSIS — Z720 Tobacco use: Secondary | ICD-10-CM | POA: Diagnosis present

## 2024-01-31 DIAGNOSIS — F17290 Nicotine dependence, other tobacco product, uncomplicated: Secondary | ICD-10-CM | POA: Diagnosis present

## 2024-01-31 DIAGNOSIS — Z79899 Other long term (current) drug therapy: Secondary | ICD-10-CM

## 2024-01-31 DIAGNOSIS — I10 Essential (primary) hypertension: Secondary | ICD-10-CM | POA: Diagnosis present

## 2024-01-31 DIAGNOSIS — E662 Morbid (severe) obesity with alveolar hypoventilation: Secondary | ICD-10-CM | POA: Diagnosis present

## 2024-01-31 DIAGNOSIS — F988 Other specified behavioral and emotional disorders with onset usually occurring in childhood and adolescence: Secondary | ICD-10-CM | POA: Diagnosis present

## 2024-01-31 DIAGNOSIS — R29704 NIHSS score 4: Secondary | ICD-10-CM | POA: Diagnosis present

## 2024-01-31 DIAGNOSIS — F411 Generalized anxiety disorder: Secondary | ICD-10-CM | POA: Diagnosis present

## 2024-01-31 DIAGNOSIS — G8929 Other chronic pain: Secondary | ICD-10-CM | POA: Diagnosis present

## 2024-01-31 DIAGNOSIS — Z825 Family history of asthma and other chronic lower respiratory diseases: Secondary | ICD-10-CM

## 2024-01-31 LAB — CBC
HCT: 48 % (ref 39.0–52.0)
Hemoglobin: 16.3 g/dL (ref 13.0–17.0)
MCH: 30 pg (ref 26.0–34.0)
MCHC: 34 g/dL (ref 30.0–36.0)
MCV: 88.2 fL (ref 80.0–100.0)
Platelets: 159 K/uL (ref 150–400)
RBC: 5.44 MIL/uL (ref 4.22–5.81)
RDW: 13.8 % (ref 11.5–15.5)
WBC: 10.7 K/uL — ABNORMAL HIGH (ref 4.0–10.5)
nRBC: 0 % (ref 0.0–0.2)

## 2024-01-31 LAB — DIFFERENTIAL
Abs Immature Granulocytes: 0.04 K/uL (ref 0.00–0.07)
Basophils Absolute: 0.1 K/uL (ref 0.0–0.1)
Basophils Relative: 1 %
Eosinophils Absolute: 0.2 K/uL (ref 0.0–0.5)
Eosinophils Relative: 2 %
Immature Granulocytes: 0 %
Lymphocytes Relative: 32 %
Lymphs Abs: 3.4 K/uL (ref 0.7–4.0)
Monocytes Absolute: 0.9 K/uL (ref 0.1–1.0)
Monocytes Relative: 8 %
Neutro Abs: 6.1 K/uL (ref 1.7–7.7)
Neutrophils Relative %: 57 %

## 2024-01-31 LAB — COMPREHENSIVE METABOLIC PANEL WITH GFR
ALT: 33 U/L (ref 0–44)
AST: 30 U/L (ref 15–41)
Albumin: 3.8 g/dL (ref 3.5–5.0)
Alkaline Phosphatase: 93 U/L (ref 38–126)
Anion gap: 7 (ref 5–15)
BUN: 11 mg/dL (ref 6–20)
CO2: 30 mmol/L (ref 22–32)
Calcium: 8.9 mg/dL (ref 8.9–10.3)
Chloride: 106 mmol/L (ref 98–111)
Creatinine, Ser: 0.82 mg/dL (ref 0.61–1.24)
GFR, Estimated: >60 mL/min (ref >60)
Glucose, Bld: 113 mg/dL — ABNORMAL HIGH (ref 70–99)
Potassium: 4.2 mmol/L (ref 3.5–5.1)
Sodium: 143 mmol/L (ref 135–145)
Total Bilirubin: 0.4 mg/dL (ref 0.0–1.2)
Total Protein: 6.9 g/dL (ref 6.5–8.1)

## 2024-01-31 LAB — PROTIME-INR
INR: 1 (ref 0.8–1.2)
Prothrombin Time: 13.7 s (ref 11.4–15.2)

## 2024-01-31 LAB — ETHANOL: Alcohol, Ethyl (B): <15 mg/dL (ref <15)

## 2024-01-31 LAB — LIPID PANEL
Cholesterol: 164 mg/dL (ref 0–200)
HDL: 41 mg/dL (ref >40)
LDL Cholesterol: 105 mg/dL — ABNORMAL HIGH (ref 0–99)
Total CHOL/HDL Ratio: 4 ratio
Triglycerides: 86 mg/dL (ref <150)
VLDL: 17 mg/dL (ref 0–40)

## 2024-01-31 LAB — HEMOGLOBIN A1C
Hgb A1c MFr Bld: 5.3 % (ref 4.8–5.6)
Mean Plasma Glucose: 105.41 mg/dL

## 2024-01-31 LAB — CBG MONITORING, ED: Glucose-Capillary: 101 mg/dL — ABNORMAL HIGH (ref 70–99)

## 2024-01-31 LAB — APTT: aPTT: 29 s (ref 24–36)

## 2024-01-31 LAB — VITAMIN B12: Vitamin B-12: 568 pg/mL (ref 180–914)

## 2024-01-31 LAB — TSH: TSH: 2.44 u[IU]/mL (ref 0.350–4.500)

## 2024-01-31 MED ORDER — STROKE: EARLY STAGES OF RECOVERY BOOK
Freq: Once | Status: AC
  Filled 2024-01-31: qty 1

## 2024-01-31 MED ORDER — ATORVASTATIN CALCIUM 40 MG PO TABS
40.0000 mg | ORAL_TABLET | Freq: Every evening | ORAL | Status: DC
  Administered 2024-01-31: 40 mg via ORAL
  Filled 2024-01-31: qty 1

## 2024-01-31 MED ORDER — ASPIRIN 81 MG PO TBEC
81.0000 mg | DELAYED_RELEASE_TABLET | Freq: Every day | ORAL | Status: DC
  Administered 2024-02-01: 81 mg via ORAL
  Filled 2024-01-31: qty 1

## 2024-01-31 MED ORDER — SODIUM CHLORIDE 0.9 % IV SOLN: INTRAVENOUS | Status: AC

## 2024-01-31 MED ORDER — ENOXAPARIN SODIUM 40 MG/0.4ML IJ SOSY
40.0000 mg | PREFILLED_SYRINGE | INTRAMUSCULAR | Status: DC
  Administered 2024-01-31: 40 mg via SUBCUTANEOUS
  Filled 2024-01-31: qty 0.4

## 2024-01-31 MED ORDER — ASPIRIN 325 MG PO TABS
325.0000 mg | ORAL_TABLET | Freq: Once | ORAL | Status: AC
  Administered 2024-01-31: 325 mg via ORAL
  Filled 2024-01-31: qty 1

## 2024-01-31 MED ORDER — ACETAMINOPHEN 325 MG PO TABS: 650.0000 mg | ORAL_TABLET | ORAL | Status: DC | PRN

## 2024-01-31 MED ORDER — ACETAMINOPHEN 160 MG/5ML PO SOLN: 650.0000 mg | ORAL | Status: DC | PRN

## 2024-01-31 MED ORDER — IOHEXOL 350 MG/ML SOLN
100.0000 mL | Freq: Once | INTRAVENOUS | Status: AC | PRN
  Administered 2024-01-31: 100 mL via INTRAVENOUS

## 2024-01-31 MED ORDER — SENNOSIDES-DOCUSATE SODIUM 8.6-50 MG PO TABS: 1.0000 | ORAL_TABLET | Freq: Every evening | ORAL | Status: DC | PRN

## 2024-01-31 MED ORDER — GADOBUTROL 1 MMOL/ML IV SOLN
10.0000 mL | Freq: Once | INTRAVENOUS | Status: AC | PRN
Start: 2024-01-31 — End: 2024-01-31
  Administered 2024-01-31: 10 mL via INTRAVENOUS

## 2024-01-31 MED ORDER — CLOPIDOGREL BISULFATE 75 MG PO TABS
300.0000 mg | ORAL_TABLET | Freq: Once | ORAL | Status: AC
  Administered 2024-01-31: 300 mg via ORAL
  Filled 2024-01-31: qty 4

## 2024-01-31 MED ORDER — CLOPIDOGREL BISULFATE 75 MG PO TABS
75.0000 mg | ORAL_TABLET | Freq: Every day | ORAL | Status: DC
  Administered 2024-02-01: 75 mg via ORAL
  Filled 2024-01-31: qty 1

## 2024-01-31 MED ORDER — ACETAMINOPHEN 650 MG RE SUPP: 650.0000 mg | RECTAL | Status: DC | PRN

## 2024-01-31 NOTE — TOC CM/SW Note (Signed)
 Transition of Care [facility]) - Inpatient Brief Assessment   Patient Details  Name: Clayton Jensen MRN: [id number] Date of Birth: February 21, 1981  Transition of Care [facility]) CM/SW Contact:    Noreen KATHEE Cleotilde ISRAEL Phone Number: 01/31/2024, 10:27 AM   Clinical Narrative:  Inpatient Care Management (ICM) has reviewed patient and no other ICM needs have been identified at this time. We will continue to monitor patient advancement through interdisciplinary progression rounds. If new patient transition needs arise, please place a ICM consult.  Transition of Care Asessment: Insurance and Status: Selfpay Patient has primary care physician: Yes Home environment has been reviewed: Single Family Home Prior level of function:: Independent Prior/Current Home Services: No current home services Social Drivers of Health Review: SDOH reviewed no interventions necessary Readmission risk has been reviewed: Yes Transition of care needs: no transition of care needs at this time

## 2024-01-31 NOTE — ED Notes (Signed)
 Teleneurologist paged at this time

## 2024-01-31 NOTE — ED Notes (Signed)
 PT assisted patient to restroom without collecting sample. Urinalysis delayed. Patient educated to provide sample at next urination.

## 2024-01-31 NOTE — ED Provider Notes (Signed)
 [place] EMERGENCY DEPARTMENT AT [facility] Provider Note   CSN: [id number] Arrival date & time: 01/31/24  [id number]     Patient presents with: Extremity Weakness and Numbness   VINAL ROSENGRANT is a 43 y.o. male.   The history is provided by the patient and the spouse.  Extremity Weakness   He has history of obstructive sleep apnea and woke up this morning with numbness in the left side of his body and difficulty walking.  He went to sleep at about 11 PM, which would be the last known normal.  He denies any headache.  His wife states that in the past he had meningitis and did have some periods where he would be numb and weak on 1 side of the body or the other, the patient is denying a fever or stiff neck.    Prior to Admission medications   Medication Sig Start Date End Date Taking? Authorizing Provider  ciprofloxacin  (CIPRO ) 500 MG tablet Take 1 tablet (500 mg total) by mouth 2 (two) times daily. 12/09/23   Stuart Vernell Norris, PA-C  methylphenidate  (RITALIN ) 10 MG tablet Take 1 tablet (10 mg total) by mouth daily. At 1:30 pm. 01/31/24   Cook, Jayce G, DO  methylphenidate  (RITALIN ) 10 MG tablet Take 1 tablet (10 mg total) by mouth daily. At 1:30 pm. 01/01/24   Cook, Jayce G, DO  methylphenidate  (RITALIN ) 10 MG tablet Take 1 tablet (10 mg total) by mouth daily. At 1:30 pm. 12/01/23   Cook, Jayce G, DO  methylphenidate  54 MG PO CR tablet Take 1 tablet (54 mg total) by mouth every morning. 01/31/24   Cook, Jayce G, DO  methylphenidate  54 MG PO CR tablet Take 1 tablet (54 mg total) by mouth every morning. 01/01/24   Cook, Jayce G, DO  methylphenidate  54 MG PO CR tablet Take 1 tablet (54 mg total) by mouth every morning. 12/01/23   Cook, Jayce G, DO    Allergies: Celery oil    Review of Systems  Musculoskeletal:  Positive for extremity weakness.  All other systems reviewed and are negative.   Updated Vital Signs BP (!) 157/85   Pulse 72   Resp 18   SpO2 97%   Physical  Exam Vitals and nursing note reviewed.   43 year old male, resting comfortably and in no acute distress. Vital signs are significant for elevated blood pressure. Oxygen saturation is 97%, which is normal. Head is normocephalic and atraumatic. PERRLA, EOMI.  Neck is nontender and supple without adenopathy.  There are no carotid bruits. Lungs are clear without rales, wheezes, or rhonchi. Heart has regular rate and rhythm without murmur. Abdomen is soft, flat, nontender. Extremities have no cyanosis or edema. Skin is warm and dry without rash. Neurologic: Awake and alert and oriented.  Speech is normal.  Visual fields are intact to confrontation.  There is no facial asymmetry.  Tongue protrudes in midline.  Shrug is symmetric.  There is decreased sensation on the left side of the face in all 3 divisions of the trigeminal nerve.  There is a severe left hemiparesis with strength 3/5 in left arm and left leg.  There is dense loss of sensation in the left side of the body involving the arm, leg, trunk.  Of note, he was able to ambulate to get into the emergency department, but I did not witness his ambulation.  (all labs ordered are listed, but only abnormal results are displayed) Labs Reviewed  CBG MONITORING,  ED - Abnormal; Notable for the following components:      Result Value   Glucose-Capillary 101 (*)    All other components within normal limits  PROTIME-INR  APTT  CBC  DIFFERENTIAL  COMPREHENSIVE METABOLIC PANEL WITH GFR  ETHANOL  URINE DRUG SCREEN  I-STAT CHEM 8, ED    EKG: EKG Interpretation Date/Time:  Tuesday January 31 2024 05:51:47 EDT Ventricular Rate:  68 PR Interval:  160 QRS Duration:  100 QT Interval:  395 QTC Calculation: 421 R Axis:   -2  Text Interpretation: Sinus rhythm RSR' in V1 or V2, right VCD or RVH When compared with ECG of 06/16/2021, No significant change was found Confirmed by Raford Lenis ([id number]) on 01/31/2024 5:56:32 AM  Radiology: No results  found.   Procedures   Medications Ordered in the ED - No data to display                                  Medical Decision Making Amount and/or Complexity of Data Reviewed Labs: ordered. Radiology: ordered.  Risk Prescription drug management.   Left sided numbness and weakness-stroke versus conversion reaction.  I have activated code stroke.  He is outside of the thrombolytic window but within the window for endovascular treatment.  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  CT head shows evidence of prior strokes but no acute findings.  I have independently viewed the images, and I have also personally discussed the findings with radiologist.  I agree with the radiologist's interpretation.  I ordered CT angiogram and perfusion, and this shows no large vessel occlusion but ischemia is suggested in the right parietal lobe with mismatch volume of 28 mm.  Once again, I have this discussed the findings directly with radiologist and I reviewed the images personally and agree with radiologist's interpretation.  I have discussed this case with Dr. Tamea of teleneurology who is concerned about stroke, but also notes that patient is able to be distracted and has better movement so there may be some component of a conversion reaction.  I observed the patient during his evaluation by teleneurology and he had significantly more movement on the left side than he had during my initial exam.  I reviewed his laboratory tests, and my interpretation is mild leukocytosis which is nonspecific, mildly elevated random glucose level.  I have ordered an MRI with and without contrast.  Dr. Tamea is ordered initial dose of clopidogrel.  Case is signed out to Dr. Melvenia, oncoming physician.  Regardless of MRI findings, he will need to be admitted for stroke workup.  CRITICAL CARE Performed by: Lenis Raford Total critical care  time: 55 minutes Critical care time was exclusive of separately billable procedures and treating other patients. Critical care was necessary to treat or prevent imminent or life-threatening deterioration. Critical care was time spent personally by me on the following activities: development of treatment plan with patient and/or surrogate as well as nursing, discussions with consultants, evaluation of patient's response to treatment, examination of patient, obtaining history from patient or surrogate, ordering and performing treatments and interventions, ordering and review of laboratory studies, ordering and review of radiographic studies, pulse oximetry and re-evaluation of patient's condition.     Final diagnoses:  Acute left hemiparesis (HCC)  Left sided numbness  Elevated random blood glucose level    ED Discharge Orders  None          Raford Lenis, MD 01/31/24 [phone]

## 2024-01-31 NOTE — ED Notes (Signed)
Cbg 101 

## 2024-01-31 NOTE — Consult Note (Signed)
 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 Nonetheless, high suspicion for an acute R subcortical small vessel ischemic stroke. Moving forward, recommend aggressive antiplatelet therapy as well as admission for stroke workup.  Our recommendations are outlined below.  Recommendations:        Stroke/Telemetry Floor       Neuro Checks (Q2)       Bedside Swallow Eval       DVT Prophylaxis       IV Fluids, Normal Saline       Head of Bed 30 Degrees       Euglycemia and Avoid Hyperthermia (PRN Acetaminophen )       Bolus with Clopidogrel 300 mg bolus x1 and initiate dual antiplatelet therapy with Aspirin 81 mg daily and Clopidogrel 75 mg daily       Antihypertensives PRN if Blood pressure is greater than 220/120 or there is a concern for End organ damage/contraindications for permissive HTN. If blood pressure is greater than 220/120 give labetalol PO or IV or Vasotec IV with a goal of 15% reduction in BP during the first 24 hours.       MRI brain w and wo con       Transthoracic echo w bubble study       TSH, A1C, lipid panel, B12       PT/OT/SLP eval       Infectious and metabolic workup per primary team  Sign Out:       Discussed with Emergency Department  Provider    ------------------------------------------------------------------------------  Advanced Imaging: CTA Head and Neck Completed.  CTP Completed.  LVO:No  Patient is not a candidate for NIR   Metrics: Last Known Well: 01/30/2024 23:00:00 Dispatch Time: 01/31/2024 06:00:02 Arrival Time: 01/31/2024 05:38:00 Initial Response Time: 01/31/2024 06:06:14 Symptoms: Sudden onset L sided weakness and numbness. Initial patient interaction: 01/31/2024 06:15:53 NIHSS Assessment Completed: 01/31/2024 06:22:03 Patient is not a candidate for Thrombolytic. Thrombolytic Medical Decision: 01/31/2024 06:22:04 Patient was not deemed candidate for Thrombolytic because of following reasons: LKW outside 4.5 hr window. .  CT Head: I personally reviewed all the CT images that were available to me and it showed: No blood products; however, at least two R hemispheric subacute to chronic subcortical strokes involving the anterior limb of the internal capsule as well as the R basal ganglia. No clear acute ischemic changes.  Primary Provider Notified of Diagnostic Impression and Management Plan on: 01/31/2024 06:42:33    ------------------------------------------------------------------------------  History of Present Illness: Patient is a 43 year old Male.  Patient was brought by private transportation with symptoms of Sudden onset L sided weakness and numbness. 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 Per patient and wife, patient  went to bed on 10/27 in his normal state of health; at 05:15 patient woke up to use the restroom and on trying to ambulate, his left leg gave out on him, causing him to slide onto the floor. Wife helped him up and on returning to bed, patient noticed that he had significant heaviness and weakness in his L leg and arm, which persisted since  patient noticed symptoms. Denies any previous history of strokes or seizures with no headache or recent trauma or falls, yet does admit to subtle swimming sensation with feeling of unsteadiness.   Past Medical History:      There is no history of Hypertension      There is no history of Diabetes Mellitus      There is no history of Hyperlipidemia      There is no history of Atrial Fibrillation  Medications:  No Anticoagulant use  No Antiplatelet use Reviewed EMR for current medications  Allergies:  Reviewed  Social History: Patient Is: Married Drug Use: No  Family History:  There is no family history of premature cerebrovascular disease pertinent to this consultation  ROS : 14 Points Review of Systems was performed and was negative except mentioned in HPI.  Past Surgical History: There Is No Surgical History Contributory To Today's Visit    Examination: BP(144/75), Pulse(71), Blood Glucose(101) 1A: Level of Consciousness - Alert; keenly responsive + 0 1B: Ask Month and Age - Both Questions Right + 0 1C: Blink Eyes & Squeeze Hands - Performs Both Tasks + 0 2: Test Horizontal Extraocular Movements - Normal + 0 3: Test Visual Fields - No Visual Loss + 0 4: Test Facial Palsy (Use Grimace if Obtunded) - Normal symmetry + 0 5A: Test Left Arm Motor Drift - Drift, but doesn't hit bed + 1 5B: Test Right Arm Motor Drift - No Drift for 10 Seconds + 0 6A: Test Left Leg Motor Drift - Drift, but doesn't hit bed + 1 6B: Test Right Leg Motor Drift - No Drift for 5 Seconds + 0 7: Test Limb Ataxia (FNF/Heel-Shin) - No Ataxia + 0 8: Test Sensation - Complete Loss: Cannot Sense Being Touched At All + 2 9: Test Language/Aphasia - Normal; No aphasia + 0 10: Test Dysarthria - Normal + 0 11: Test Extinction/Inattention - No abnormality + 0  NIHSS Score: 4  NIHSS Free Text : Decreased sensation L arm, no sensation L leg. LUE pronator drift with subtle vertical drift  Pre-Morbid  Modified Rankin Scale: 0 Points = No symptoms at all  Spoke with : Dr Raford I reviewed the available imaging via [facility] and initiated discussion with the primary provider  This consult was conducted in real time using interactive audio and video technology. Patient was informed of the technology being used for this visit and agreed to proceed. Patient located in hospital and provider located at home/office setting.   Patient is being evaluated for possible acute neurologic impairment and high probability of imminent or life-threatening deterioration. I spent total of 28 minutes providing care to this patient, including time for face to face visit via telemedicine, review of medical records, imaging studies and discussion of findings with providers, the patient and/or family.    Dr Alonna Sanders   [facility] For Inpatient follow-up with [facility] physician please call [facility] at [phone]. As we are not an outpatient service for any post hospital discharge needs please contact the hospital for assistance. If you have any questions for the [facility] physicians or  need to reconsult for clinical or diagnostic changes please contact us  via [facility] at [phone].  Non-radiologist review of imaging performed to assist with emergent clinical decision-making. Remote physician workstations do not possess the same resolution, calibration, or diagnostic capabilities as hospital-based radiology reading stations, and formal radiologist read is necessary.   Signature : Alonna Sanders

## 2024-01-31 NOTE — Progress Notes (Signed)
   01/31/24 2002  BiPAP/CPAP/SIPAP  Reason BIPAP/CPAP not in use Non-compliant   Patient states he doesn't wear a CPAP at home and doesn't want one while in the hospital. Told patient one would be provided if he changed his mind.

## 2024-01-31 NOTE — Evaluation (Signed)
 Physical Therapy Evaluation Patient Details Name: Clayton Jensen MRN: [id number] DOB: 12-22-1980 Today's Date: 01/31/2024  History of Present Illness  Clayton Jensen is a 43 year old male with past medical history of obesity, OSA/OHS, viral meningeal encephalitis 6 years ago, tobacco, prediabetes, hyperlipidemia, GAD, chronic lower back pain, ADD who presented to the emergency early this morning stating that he woke up unable to move his left side.  He was last known to be fully well at around 11 PM.  He had no difficulty with speech but he has been having dizziness and numbness especially on the left upper and lower extremities.  He reports no fever or chills and denies headache and neck pain and stiffness.  In the ED he had a CT angiogram and perfusion study with findings of no large vessel occlusion but ischemia suggested in the right parietal lobe.  Code stroke was called and he was evaluated by teleneurologist Dr. Tamea who was also concerned about CVA.  He was loaded with clopidogrel and aspirin and admission requested for full stroke workup and MRI.   Clinical Impression  Patient appears to mostly at baseline other than slightly slower than normal velocity of gait, no loss of balance or need for an AD. Patient has no significant difference in BLE strength. Patient encouraged to ambulate ad lib in room and with nursing staff/family supervising in hallways. Patient will benefit from continued skilled physical therapy in hospital and recommended venue below to increase strength, balance, endurance for safe ADLs and gait.         If plan is discharge home, recommend the following: Help with stairs or ramp for entrance   Can travel by private vehicle        Equipment Recommendations None recommended by PT  Recommendations for Other Services       Functional Status Assessment Patient has had a recent decline in their functional status and/or demonstrates limited ability to make  significant improvements in function in a reasonable and predictable amount of time     Precautions / Restrictions Precautions Precautions: None Recall of Precautions/Restrictions: Intact Restrictions Weight Bearing Restrictions Per Provider Order: No      Mobility  Bed Mobility Overal bed mobility: Independent                  Transfers Overall transfer level: Independent                      Ambulation/Gait Ambulation/Gait assistance: Modified independent (Device/Increase time) Gait Distance (Feet): 100 Feet Assistive device: None Gait Pattern/deviations: Decreased step length - right, Decreased step length - left, Decreased stride length Gait velocity: decreased     General Gait Details: grossly WFL with slower than normal gait velocity, no loss of balance or bumping into objects or need for an AD  Stairs            Wheelchair Mobility     Tilt Bed    Modified Rankin (Stroke Patients Only)       Balance Overall balance assessment: Independent                                           Pertinent Vitals/Pain Pain Assessment Pain Assessment: No/denies pain    Home Living Family/patient expects to be discharged to:: Private residence Living Arrangements: Spouse/significant other Available Help at Discharge: Family;Available 24 hours/day Type  of Home: House Home Access: Stairs to enter Entrance Stairs-Rails: None Entrance Stairs-Number of Steps: 2 Alternate Level Stairs-Number of Steps: Patient states he does not have to go upstairs Home Layout: Two level;Able to live on main level with bedroom/bathroom;Full bath on main level Home Equipment: None      Prior Function Prior Level of Function : Independent/Modified Independent;Driving             Mobility Comments: Community ambulation without AD, drives, works ADLs Comments: Independent     Extremity/Trunk Assessment   Upper Extremity Assessment Upper  Extremity Assessment: Defer to OT evaluation    Lower Extremity Assessment Lower Extremity Assessment: Overall WFL for tasks assessed    Cervical / Trunk Assessment Cervical / Trunk Assessment: Normal  Communication   Communication Communication: No apparent difficulties    Cognition Arousal: Alert Behavior During Therapy: WFL for tasks assessed/performed                             Following commands: Intact       Cueing Cueing Techniques: Verbal cues     General Comments      Exercises     Assessment/Plan    PT Assessment Patient needs continued PT services  PT Problem List Decreased mobility;Decreased activity tolerance;Obesity       PT Treatment Interventions Gait training;Stair training;Functional mobility training;Therapeutic activities;Therapeutic exercise;Balance training;DME instruction;Patient/family education    PT Goals (Current goals can be found in the Care Plan section)  Acute Rehab PT Goals Patient Stated Goal: return home PT Goal Formulation: With patient/family Time For Goal Achievement: 02/02/24 Potential to Achieve Goals: Good    Frequency Min 2X/week     Co-evaluation PT/OT/SLP Co-Evaluation/Treatment: Yes Reason for Co-Treatment: To address functional/ADL transfers PT goals addressed during session: Mobility/safety with mobility;Balance         AM-PAC PT 6 Clicks Mobility  Outcome Measure Help needed turning from your back to your side while in a flat bed without using bedrails?: None Help needed moving from lying on your back to sitting on the side of a flat bed without using bedrails?: None Help needed moving to and from a bed to a chair (including a wheelchair)?: None Help needed standing up from a chair using your arms (e.g., wheelchair or bedside chair)?: None Help needed to walk in hospital room?: A Little Help needed climbing 3-5 steps with a railing? : A Little 6 Click Score: 22    End of Session    Activity Tolerance: Patient tolerated treatment well Patient left: in bed;with call bell/phone within reach;with family/visitor present Nurse Communication: Mobility status PT Visit Diagnosis: Other abnormalities of gait and mobility (R26.89);Difficulty in walking, not elsewhere classified (R26.2);Unsteadiness on feet (R26.81)    Time: 1020-1045 PT Time Calculation (min) (ACUTE ONLY): 25 min   Charges:   PT Evaluation $PT Eval Moderate Complexity: 1 Mod PT Treatments $Therapeutic Activity: 23-37 mins PT General Charges $$ ACUTE PT VISIT: 1 Visit         1:47 PM, 01/31/24 Lynwood Music, MPT Physical Therapist with [facility] 336 [phone] office [phone] mobile phone

## 2024-01-31 NOTE — H&P (Signed)
 History and Physical  [facility]  SUN WILENSKY FMW:[id number] DOB: 08-12-1980 DOA: 01/31/2024  PCP: Cook, Jayce G, DO  Patient coming from: Home  Level of care: Telemetry  I have personally briefly reviewed patient's old medical records in [facility] Link  Chief Complaint: left sided numbness  HPI: Clayton Jensen is a 43 year old male with past medical history of obesity, OSA/OHS, viral meningeal encephalitis 6 years ago, tobacco, prediabetes, hyperlipidemia, GAD, chronic lower back pain, ADD who presented to the emergency early this morning stating that he woke up unable to move his left side.  He was last known to be fully well at around 11 PM.  He had no difficulty with speech but he has been having dizziness and numbness especially on the left upper and lower extremities.  He reports no fever or chills and denies headache and neck pain and stiffness.  In the ED he had a CT angiogram and perfusion study with findings of no large vessel occlusion but ischemia suggested in the right parietal lobe.  Code stroke was called and he was evaluated by teleneurologist Dr. Tamea who was also concerned about CVA.  He was loaded with clopidogrel and aspirin and admission requested for full stroke workup and MRI.   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   OSA (obstructive sleep apnea)    clinical diagnosis, not officially diagnosed per family   PNA (pneumonia) 08/29/2017   Sepsis (HCC) 08/29/2017   Vertigo     Past Surgical History:  Procedure Laterality Date   KNEE ARTHROSCOPY Left    VIDEO ASSISTED THORACOSCOPY (VATS)/EMPYEMA Right 08/30/2017   Procedure: VIDEO ASSISTED THORACOSCOPY (VATS)/EMPYEMA;  Surgeon: Lucas Dorise POUR, MD;  Location: [facility] OR;  Service: Thoracic;  Laterality: Right;      reports that he has been smoking cigarettes and cigars. He has a 17.3 pack-year smoking history. He has never used smokeless tobacco. He reports current alcohol use. He reports that he does not use drugs.  Allergies  Allergen Reactions   Celery Oil Shortness Of Breath and Swelling    Swelling of throat    Family History  Problem Relation Age of Onset   COPD Mother    Atrial fibrillation Mother    Diabetes Mother    Diabetes Father     Prior to Admission medications   Medication Sig Start Date End Date Taking? Authorizing Provider  ciprofloxacin  (CIPRO ) 500 MG tablet Take 1 tablet (500 mg total) by mouth 2 (two) times daily. 12/09/23   Stuart Vernell Norris, PA-C  methylphenidate  (RITALIN ) 10 MG tablet Take 1 tablet (10 mg total) by mouth daily. At 1:30 pm. 01/31/24   Cook, Jayce G, DO  methylphenidate  (RITALIN ) 10 MG tablet Take 1 tablet (10 mg total) by mouth daily. At 1:30 pm. 01/01/24   Cook, Jayce G, DO  methylphenidate  (RITALIN ) 10 MG tablet Take 1 tablet (10 mg total) by mouth daily. At 1:30 pm. 12/01/23   Cook, Jayce G, DO  methylphenidate  54 MG PO CR tablet Take 1 tablet (54 mg total) by mouth every morning. 01/31/24   Cook, Jayce G, DO  methylphenidate  54 MG PO CR tablet Take 1 tablet (54 mg total) by mouth every morning. 01/01/24   Cook, Jayce G, DO  methylphenidate  54 MG PO CR tablet Take 1 tablet (54 mg total) by mouth every morning. 12/01/23   Bluford Jacqulyn MATSU, DO    Physical Exam: Vitals:   01/31/24 0556 01/31/24 0620 01/31/24 0630 01/31/24 0631  BP:  (!) 136/91 (!) 144/75   Pulse: 69  71   Resp: (!) 22  18   Temp:    98 F (36.7 C)  TempSrc:    Oral  SpO2: 96%  97%     Constitutional: NAD, calm, comfortable Eyes: PERRL, lids and conjunctivae normal ENMT: Mucous membranes are moist. Posterior pharynx clear of any exudate or lesions.Normal dentition.  Neck: normal, supple, no masses, no thyromegaly Respiratory: clear to auscultation bilaterally, no wheezing, no  crackles. Normal respiratory effort. No accessory muscle use.  Cardiovascular: normal s1, s2 sounds, no murmurs / rubs / gallops. No extremity edema. 2+ pedal pulses. No carotid bruits.  Abdomen: no tenderness, no masses palpated. No hepatosplenomegaly. Bowel sounds positive.  Musculoskeletal: no clubbing / cyanosis. No joint deformity upper and lower extremities. Good ROM, no contractures. Normal muscle tone.  Skin: no rashes, lesions, ulcers. No induration Neurologic: CN 2-12 grossly intact. Sensation diminished mildly in LLE, DTR normal. Strength 5/5 in all 4.  Psychiatric: Normal judgment and insight. Alert and oriented x 3. Normal mood.   Labs on Admission: I have personally reviewed following labs and imaging studies  CBC: Recent Labs  Lab 01/31/24 0550  WBC 10.7*  NEUTROABS 6.1  HGB 16.3  HCT 48.0  MCV 88.2  PLT 159   Basic Metabolic Panel: Recent Labs  Lab 01/31/24 0550  NA 143  K 4.2  CL 106  CO2 30  GLUCOSE 113*  BUN 11  CREATININE 0.82  CALCIUM  8.9   GFR: CrCl cannot be calculated (Unknown ideal weight.). Liver Function Tests: Recent Labs  Lab 01/31/24 0550  AST 30  ALT 33  ALKPHOS 93  BILITOT 0.4  PROT 6.9  ALBUMIN 3.8   No results for input(s): LIPASE, AMYLASE in the last 168 hours. No results for input(s): AMMONIA in the last 168 hours. Coagulation Profile: Recent Labs  Lab 01/31/24 0550  INR 1.0   Cardiac Enzymes: No results for input(s): CKTOTAL, CKMB, CKMBINDEX, TROPONINI in the last 168 hours. BNP (last 3 results) No results for input(s): PROBNP in the last 8760 hours. HbA1C: No results for input(s): HGBA1C in the last 72 hours. CBG: Recent Labs  Lab 01/31/24 0546  GLUCAP 101*   Lipid Profile: No results for input(s): CHOL, HDL, LDLCALC, TRIG, CHOLHDL, LDLDIRECT in the last 72 hours. Thyroid  Function Tests: No results for input(s): TSH, T4TOTAL, FREET4, T3FREE, THYROIDAB in the last 72  hours. Anemia Panel: No results for input(s): VITAMINB12, FOLATE, FERRITIN, TIBC, IRON, RETICCTPCT in the last 72 hours. Urine analysis:    Component Value Date/Time   COLORURINE YELLOW 04/04/2017 0832   APPEARANCEUR CLEAR 04/04/2017 0832   LABSPEC 1.017 04/04/2017 0832   PHURINE 6.0 04/04/2017 0832   GLUCOSEU NEGATIVE 04/04/2017 0832   HGBUR SMALL (A) 04/04/2017 0832   BILIRUBINUR negative 12/09/2023 1638   KETONESUR negative 12/09/2023 1638   KETONESUR NEGATIVE 04/04/2017 0832   PROTEINUR 100 (A) 04/04/2017 0832   UROBILINOGEN 1.0 12/09/2023 1638   UROBILINOGEN 0.2 10/17/2014 0350   NITRITE Positive (A) 12/09/2023 1638   NITRITE NEGATIVE 04/04/2017 0832   LEUKOCYTESUR Trace (A) 12/09/2023 1638    Radiological Exams on Admission: CT ANGIO HEAD NECK W WO CM W PERF (CODE STROKE) LKW > 6h Result  Date: 01/31/2024 EXAM: CT BRAIN PERFUSION 01/31/2024 06:18:33 AM TECHNIQUE: Cerebral perfusion analysis using computed tomography with contrast administration, including post-processing of parametric maps with determination of cerebral blood flow, cerebral blood volume, mean transit time and time-to-maximum. Automated exposure control, iterative reconstruction, and/or weight based adjustment of the mA/kV was utilized to reduce the radiation dose to as low as reasonably achievable. 100 mL (iohexol  (OMNIPAQUE ) 350 MG/ML injection 100 mL IOHEXOL  350 MG/ML SOLN) was administered. COMPARISON: CT of the head dated 01/31/2024 and 12/26/2020. CLINICAL HISTORY: Neuro deficit, acute, stroke suspected. Pt states he woke up this morning and was not able to move his left side. LKW was 2300 last night. No slurred speech or facial droop. Pt endorses dizziness and numbness. FINDINGS: CT PERFUSION: EXAM QUALITY: The examination is adequate with diagnostic perfusion maps. No significant motion artifact. Appropriate arterial inflow and venous outflow curves. CORE INFARCT (CBF<30% volume): 0 mL TOTAL  HYPOPERFUSION (Tmax>6s volume): 28 mL PENUMBRA: Mismatch volume: 28 mL Mismatch ratio: Not applicable Location: Right parietal lobe. Ischemia is suggested within the right parietal lobe. VASCULATURE: The right vertebral artery is dominant and the left vertebral artery is relatively hypoplastic, uniform in caliber. There is no evidence of large vessel occlusion, flow-limiting stenosis, or cerebral aneurysm. DISCUSSION: The above findings were discussed with Doctor Alm Lias at 6:31 AM 01/31/2024. IMPRESSION: 1. Ischemia suggested within the right parietal lobe with a mismatch volume of 28 mL. 2. No evidence of large vessel occlusion, flow-limiting stenosis, or cerebral aneurysm. 3. Findings discussed with Dr. Alm Lias at 6:31 AM on 01/31/24. Electronically signed by: Evalene Coho MD 01/31/2024 06:36 AM EDT RP Workstation: [id number]   CT HEAD CODE STROKE WO CONTRAST (LKW 0-4.5h, LVO 0-24h) Result Date: 01/31/2024 EXAM: CT HEAD WITHOUT 01/31/2024 06:05:13 AM TECHNIQUE: CT of the head was performed without the administration of intravenous contrast. Automated exposure control, iterative reconstruction, and/or weight based adjustment of the mA/kV was utilized to reduce the radiation dose to as low as reasonably achievable. COMPARISON: CT of the head dated 12/26/2020. CLINICAL HISTORY: Neuro deficit, acute, stroke suspected. Pt states he woke up this morning and was not able to move his left side. LKW was 2300 last night. No slurred speech or facial droop. Pt endorses dizziness and numbness. FINDINGS: BRAIN AND VENTRICLES: No acute intracranial hemorrhage. No mass effect or midline shift. No extra-axial fluid collection. No evidence of acute infarct. No hydrocephalus. Chronic encephalomalacia changes present within the anterior limb of the right internal capsule and periventricular white matter. ORBITS: No acute abnormality. SINUSES AND MASTOIDS: No acute abnormality. SOFT TISSUES AND SKULL: No acute skull  fracture. No acute soft tissue abnormality. [place] Stroke Program Early CT Score (ASPECTS) ----- Ganglionic (caudate, IC, lentiform nucleus, insula, M1-M3): 7 Supraganglionic (M4-M6): 3 Total: 10 IMPRESSION: 1. No acute intracranial abnormality. 2. Chronic encephalomalacia changes in the anterior limb of the right internal capsule and periventricular white matter. 3. The above findings were discussed with doctor glik at 6:09 am 01/31/2024. Electronically signed by: Evalene Coho MD 01/31/2024 06:14 AM EDT RP Workstation: [id number]    EKG: Independently reviewed.   Assessment/Plan Principal Problem:   Acute CVA (cerebrovascular accident) (HCC) Active Problems:   History of viral meningitis   Chronic lower back pain   ADD (attention deficit disorder)   Morbid obesity (HCC)   Tobacco abuse   Prediabetes   Hyperlipidemia   Generalized anxiety disorder   Acute CVA -- MRI confirming signs of acute ischemia -- appreciate teleneurologist consultation -- pt has  been loaded with 300 mg of clopidogrel and aspirin in ED -- continue aspirin 81 mg daily and clopidogrel 75 mg daily starting 10/29 -- full stroke work up in progress -- TTE pending -- check fasting lipids and add statin therapy for LDL>70 -- check TSH, B12 per neuro recs -- PT/OT evaluation requested -- allowing permissive hypertension  -- check A1c -- begin tobacco cessation education/counseling  Elevated BPs -- possibly acute stress reaction -- follow for now, permissive hypertension  OSA -- offer nightly CPAP  ADD -- holding home amphetamine for now  Hyperlipidemia -- starting atorvastatin 40 mg every evening  Prediabetes mellitus -- follow up A1c test   DVT prophylaxis: enoxaparin    Code Status: Full   Family Communication: wife at bedside   Disposition Plan:   Consults called: teleneurology   Admission status: IP Time spent: 60 mins  Level of care: Telemetry Haeven Nickle MD [facility] How  to contact the [facility] Attending or Consulting provider 7A - 7P or covering provider during after hours 7P -7A, for this patient?  Check the care team in [facility] and look for a) attending/consulting [facility] provider listed and b) the [facility] team listed Log into [email or web address] and use [place]'s universal password to access. If you do not have the password, please contact the hospital operator. Locate the [facility] provider you are looking for under [facility] and page to a number that you can be directly reached. If you still have difficulty reaching the provider, please page the [facility] (Director on Call) for the Hospitalists listed on amion for assistance.   If 7PM-7AM, please contact night-coverage [email or web address] Password [email or web address]  01/31/2024, 7:40 AM

## 2024-01-31 NOTE — ED Notes (Signed)
 ED TO INPATIENT HANDOFF REPORT  ED Nurse Name and Phone #: [phone]  S Name/Age/Gender Clayton Jensen Overlie 43 y.o. male Room/Bed: APA08/APA08  Code Status   Code Status: Full Code  Home/SNF/Other Home Patient oriented to: self, place, time, and situation Is this baseline? Yes   Triage Complete: Triage complete  Chief Complaint Acute CVA (cerebrovascular accident) [facility]) [I63.9]  Triage Note Pt states he woke up this morning and was not able to move his left side. LKW was 2300 last night. No slurred speech or facial droop. Pt endorses dizziness and numbness    Allergies Allergies  Allergen Reactions   Celery Oil Shortness Of Breath and Swelling    Swelling of throat    Level of Care/Admitting Diagnosis ED Disposition     ED Disposition  Admit   Condition  --   Comment  Hospital Area: [facility] [[id number]]  Level of Care: Telemetry [5]  Diagnosis: Acute CVA (cerebrovascular accident) [facility]) [[id number]]  Admitting Physician: VICCI AFTON CROME [[id number]]  Attending Physician: VICCI AFTON CROME [[id number]]  Certification:: I certify this patient will need inpatient services for at least 2 midnights  Expected Medical Readiness: 02/02/2024          B Medical/Surgery History 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   OSA (obstructive sleep apnea)    clinical diagnosis, not officially diagnosed per family   PNA (pneumonia) 08/29/2017   Sepsis (HCC) 08/29/2017   Vertigo    Past Surgical History:  Procedure Laterality Date   KNEE ARTHROSCOPY Left    VIDEO ASSISTED THORACOSCOPY (VATS)/EMPYEMA Right 08/30/2017   Procedure: VIDEO ASSISTED THORACOSCOPY (VATS)/EMPYEMA;  Surgeon: Lucas Dorise POUR, MD;  Location: [facility] OR;  Service: Thoracic;  Laterality: Right;     A IV  Location/Drains/Wounds Patient Lines/Drains/Airways Status     Active Line/Drains/Airways     Name Placement date Placement time Site Days   Peripheral IV 01/31/24 18 G Right Antecubital 01/31/24  0552  Antecubital  less than 1            Intake/Output Last 24 hours No intake or output data in the 24 hours ending 01/31/24 1256  Labs/Imaging Results for orders placed or performed during the hospital encounter of 01/31/24 (from the past 48 hours)  CBG monitoring, ED     Status: Abnormal   Collection Time: 01/31/24  5:46 AM  Result Value Ref Range   Glucose-Capillary 101 (H) 70 - 99 mg/dL    Comment: Glucose reference range applies only to samples taken after fasting for at least 8 hours.  Protime-INR     Status: None   Collection Time: 01/31/24  5:50 AM  Result Value Ref Range   Prothrombin Time 13.7 11.4 - 15.2 seconds   INR 1.0 0.8 - 1.2    Comment: (NOTE) INR goal varies based on device and disease states. Performed at [facility], [address], [place], [place] [address]   APTT     Status: None   Collection Time: 01/31/24  5:50 AM  Result Value Ref Range   aPTT 29 24 - 36 seconds    Comment: Performed at [facility], [address], [place], [place] [address]  CBC     Status: Abnormal   Collection Time: 01/31/24  5:50 AM  Result Value Ref Range   WBC 10.7 (H) 4.0 - 10.5 K/uL   RBC 5.44 4.22 - 5.81 MIL/uL   Hemoglobin 16.3 13.0 - 17.0 g/dL   HCT 51.9 60.9 - 47.9 %   MCV 88.2 80.0 - 100.0 fL   MCH 30.0 26.0 - 34.0 pg   MCHC 34.0 30.0 - 36.0 g/dL   RDW 86.1 88.4 - 84.4 %   Platelets 159 150 - 400 K/uL   nRBC 0.0 0.0 - 0.2 %    Comment: Performed at [facility], [address] [place], [place] [address]  Differential     Status: None   Collection Time: 01/31/24  5:50 AM  Result Value Ref Range   Neutrophils Relative % 57 %   Neutro Abs 6.1 1.7 - 7.7 K/uL   Lymphocytes Relative 32 %   Lymphs Abs 3.4 0.7 - 4.0 K/uL   Monocytes Relative 8 %   Monocytes  Absolute 0.9 0.1 - 1.0 K/uL   Eosinophils Relative 2 %   Eosinophils Absolute 0.2 0.0 - 0.5 K/uL   Basophils Relative 1 %   Basophils Absolute 0.1 0.0 - 0.1 K/uL   Immature Granulocytes 0 %   Abs Immature Granulocytes 0.04 0.00 - 0.07 K/uL    Comment: Performed at [facility], [address] [place], [place] [address]  Comprehensive metabolic panel     Status: Abnormal   Collection Time: 01/31/24  5:50 AM  Result Value Ref Range   Sodium 143 135 - 145 mmol/L   Potassium 4.2 3.5 - 5.1 mmol/L   Chloride 106 98 - 111 mmol/L   CO2 30 22 - 32 mmol/L   Glucose, Bld 113 (H) 70 - 99 mg/dL    Comment: Glucose reference range applies only to samples taken after fasting for at least 8 hours.   BUN 11 6 - 20 mg/dL   Creatinine, Ser 9.17 0.61 - 1.24 mg/dL   Calcium  8.9 8.9 - 10.3 mg/dL   Total Protein 6.9 6.5 - 8.1 g/dL   Albumin 3.8 3.5 - 5.0 g/dL   AST 30 15 - 41 U/L   ALT 33 0 - 44 U/L   Alkaline Phosphatase 93 38 - 126 U/L   Total Bilirubin 0.4 0.0 - 1.2 mg/dL   GFR, Estimated >39 >39 mL/min    Comment: (NOTE) Calculated using the CKD-EPI Creatinine Equation (2021)    Anion gap 7 5 - 15    Comment: Performed at [facility], [address] [place], [place] [address]  Ethanol     Status: None   Collection Time: 01/31/24  5:50 AM  Result Value Ref Range   Alcohol, Ethyl (B) <15 <15 mg/dL    Comment: (NOTE) For medical purposes only. Performed at [facility], [address], [place], [place] [address]   Lipid panel     Status: Abnormal   Collection Time: 01/31/24  5:50 AM  Result Value Ref Range   Cholesterol 164 0 - 200 mg/dL    Comment:        ATP III CLASSIFICATION:  <200     mg/dL   Desirable  799-760  mg/dL   Borderline High  >=759    mg/dL   High           Triglycerides 86 <150 mg/dL   HDL 41 >59 mg/dL   Total CHOL/HDL Ratio 4.0 RATIO   VLDL 17 0 - 40 mg/dL   LDL Cholesterol 894 (H) 0 - 99  mg/dL    Comment:        Total Cholesterol/HDL:CHD Risk Coronary Heart  Disease Risk Table                     Men   Women  1/2 Average Risk   3.4   3.3  Average Risk       5.0   4.4  2 X Average Risk   9.6   7.1  3 X Average Risk  23.4   11.0        Use the calculated Patient Ratio above and the CHD Risk Table to determine the patient's CHD Risk.        ATP III CLASSIFICATION (LDL):  <100     mg/dL   Optimal  899-870  mg/dL   Near or Above                    Optimal  130-159  mg/dL   Borderline  839-810  mg/dL   High  >809     mg/dL   Very High Performed at [facility], [address] [place], [place] [address]   TSH     Status: None   Collection Time: 01/31/24  5:50 AM  Result Value Ref Range   TSH 2.440 0.350 - 4.500 uIU/mL    Comment: Performed at [facility], [address], [place], [place] [address]  Vitamin B12     Status: None   Collection Time: 01/31/24  5:50 AM  Result Value Ref Range   Vitamin B-12 568 180 - 914 pg/mL    Comment: Performed at [facility], [address], [place], [place] [address]   MR BRAIN W WO CONTRAST Result Date: 01/31/2024 EXAM: MRI BRAIN WITH AND WITHOUT CONTRAST 01/31/2024 07:28:29 AM TECHNIQUE: Multiplanar multisequence MRI of the head/brain was performed with and without the administration of 10 mL gadobutrol (GADAVIST) 1 MMOL/ML injection. COMPARISON: CT of the head dated 01/31/2024 and MRI of the head dated 04/06/2017. CLINICAL HISTORY: Neuro deficit, acute, stroke suspected. FINDINGS: BRAIN AND VENTRICLES: Restricted diffusion within the right periventricular white matter and thalamocapsular junction. No acute intracranial hemorrhage. No mass effect or midline shift. No hydrocephalus. The sella is unremarkable. Normal flow voids. No mass or abnormal enhancement. There are chronic ischemic changes present along the frontal horns of the lateral ventricles bilaterally, worse on the right. There are scattered foci of increased T2 signal within the centrum semiovale bilaterally. ORBITS: No acute abnormality. SINUSES:  No acute abnormality. BONES AND SOFT TISSUES: Normal bone marrow signal and enhancement. No acute soft tissue abnormality. IMPRESSION: 1. Restricted diffusion within the right periventricular white matter and thalamocapsular junction, consistent with acute ischemic changes. 2. Chronic ischemic changes along the frontal horns of the lateral ventricles bilaterally, worse on the right. 3. Scattered foci of increased T2 signal within the centrum semiovale bilaterally. Electronically signed by: Evalene Coho MD 01/31/2024 07:42 AM EDT RP Workstation: [id number]   CT ANGIO HEAD NECK W WO CM W PERF (CODE STROKE) LKW > 6h Result Date: 01/31/2024 EXAM: CT BRAIN PERFUSION 01/31/2024 06:18:33 AM TECHNIQUE: Cerebral perfusion analysis using computed tomography with contrast administration, including post-processing of parametric maps with determination of cerebral blood flow, cerebral blood volume, mean transit time and time-to-maximum. Automated exposure control, iterative reconstruction, and/or weight based adjustment of the mA/kV was utilized to reduce the radiation dose to as low as reasonably achievable. 100 mL (iohexol  (OMNIPAQUE ) 350 MG/ML injection 100 mL IOHEXOL  350 MG/ML  SOLN) was administered. COMPARISON: CT of the head dated 01/31/2024 and 12/26/2020. CLINICAL HISTORY: Neuro deficit, acute, stroke suspected. Pt states he woke up this morning and was not able to move his left side. LKW was 2300 last night. No slurred speech or facial droop. Pt endorses dizziness and numbness. FINDINGS: CT PERFUSION: EXAM QUALITY: The examination is adequate with diagnostic perfusion maps. No significant motion artifact. Appropriate arterial inflow and venous outflow curves. CORE INFARCT (CBF<30% volume): 0 mL TOTAL HYPOPERFUSION (Tmax>6s volume): 28 mL PENUMBRA: Mismatch volume: 28 mL Mismatch ratio: Not applicable Location: Right parietal lobe. Ischemia is suggested within the right parietal lobe. VASCULATURE: The right  vertebral artery is dominant and the left vertebral artery is relatively hypoplastic, uniform in caliber. There is no evidence of large vessel occlusion, flow-limiting stenosis, or cerebral aneurysm. DISCUSSION: The above findings were discussed with Doctor Alm Lias at 6:31 AM 01/31/2024. IMPRESSION: 1. Ischemia suggested within the right parietal lobe with a mismatch volume of 28 mL. 2. No evidence of large vessel occlusion, flow-limiting stenosis, or cerebral aneurysm. 3. Findings discussed with Dr. Alm Lias at 6:31 AM on 01/31/24. Electronically signed by: Evalene Coho MD 01/31/2024 06:36 AM EDT RP Workstation: [id number]   CT HEAD CODE STROKE WO CONTRAST (LKW 0-4.5h, LVO 0-24h) Result Date: 01/31/2024 EXAM: CT HEAD WITHOUT 01/31/2024 06:05:13 AM TECHNIQUE: CT of the head was performed without the administration of intravenous contrast. Automated exposure control, iterative reconstruction, and/or weight based adjustment of the mA/kV was utilized to reduce the radiation dose to as low as reasonably achievable. COMPARISON: CT of the head dated 12/26/2020. CLINICAL HISTORY: Neuro deficit, acute, stroke suspected. Pt states he woke up this morning and was not able to move his left side. LKW was 2300 last night. No slurred speech or facial droop. Pt endorses dizziness and numbness. FINDINGS: BRAIN AND VENTRICLES: No acute intracranial hemorrhage. No mass effect or midline shift. No extra-axial fluid collection. No evidence of acute infarct. No hydrocephalus. Chronic encephalomalacia changes present within the anterior limb of the right internal capsule and periventricular white matter. ORBITS: No acute abnormality. SINUSES AND MASTOIDS: No acute abnormality. SOFT TISSUES AND SKULL: No acute skull fracture. No acute soft tissue abnormality. [place] Stroke Program Early CT Score (ASPECTS) ----- Ganglionic (caudate, IC, lentiform nucleus, insula, M1-M3): 7 Supraganglionic (M4-M6): 3 Total: 10 IMPRESSION: 1.  No acute intracranial abnormality. 2. Chronic encephalomalacia changes in the anterior limb of the right internal capsule and periventricular white matter. 3. The above findings were discussed with doctor glik at 6:09 am 01/31/2024. Electronically signed by: Evalene Coho MD 01/31/2024 06:14 AM EDT RP Workstation: [id number]    Pending Labs Unresulted Labs (From admission, onward)     Start     Ordered   02/07/24 0500  Creatinine, serum  (enoxaparin  (LOVENOX )    CrCl >/= 30 ml/min)  Weekly,   R     Comments: while on enoxaparin  therapy    01/31/24 0737   01/31/24 0733  HIV Antibody (routine testing w rflx)  (HIV Antibody (Routine testing w reflex) panel)  Once,   R        01/31/24 0737   01/31/24 0732  Hemoglobin A1c  Add-on,   AD        01/31/24 0731   01/31/24 0553  Urine rapid drug screen (hosp performed)  (Code Stroke (Thrombolytic / IR candidate emergent stroke workup.))  Once,   URGENT       Question:  Indication  Answer:  Altered mental status,  unspecified R41.82   01/31/24 0553            Vitals/Pain Today's Vitals   01/31/24 1034 01/31/24 1034 01/31/24 1100 01/31/24 1200  BP:   (!) 114/52 130/72  Pulse: 60  62 66  Resp: 18  20 (!) 21  Temp:  98.3 F (36.8 C)    TempSrc:  Oral    SpO2: 96%  95% 95%  PainSc:        Isolation Precautions No active isolations  Medications Medications   stroke: early stages of recovery book (has no administration in time range)  0.9 %  sodium chloride  infusion ( Intravenous New Bag/Given 01/31/24 0841)  acetaminophen  (TYLENOL ) tablet 650 mg (has no administration in time range)    Or  acetaminophen  (TYLENOL ) 160 MG/5ML solution 650 mg (has no administration in time range)    Or  acetaminophen  (TYLENOL ) suppository 650 mg (has no administration in time range)  senna-docusate (Senokot-S) tablet 1 tablet (has no administration in time range)  enoxaparin  (LOVENOX ) injection 40 mg (has no administration in time range)   clopidogrel (PLAVIX) tablet 75 mg (has no administration in time range)  aspirin EC tablet 81 mg (has no administration in time range)  atorvastatin (LIPITOR) tablet 40 mg (has no administration in time range)  iohexol  (OMNIPAQUE ) 350 MG/ML injection 100 mL (100 mLs Intravenous Contrast Given 01/31/24 0611)  clopidogrel (PLAVIX) tablet 300 mg (300 mg Oral Given 01/31/24 0650)  aspirin tablet 325 mg (325 mg Oral Given 01/31/24 0651)  gadobutrol (GADAVIST) 1 MMOL/ML injection 10 mL (10 mLs Intravenous Contrast Given 01/31/24 9287)    Mobility walks     Focused Assessments    R Recommendations: See Admitting Provider Note  Report given to:   Additional Notes:

## 2024-01-31 NOTE — ED Triage Notes (Signed)
 Pt states he woke up this morning and was not able to move his left side. LKW was 2300 last night. No slurred speech or facial droop. Pt endorses dizziness and numbness

## 2024-01-31 NOTE — Hospital Course (Signed)
 43 year old male with past medical history of obesity, OSA/OHS, viral meningeal encephalitis 6 years ago, tobacco, prediabetes, hyperlipidemia, GAD, chronic lower back pain, ADD who presented to the emergency early this morning stating that he woke up unable to move his left side.  He was last known to be fully well at around 11 PM.  He had no difficulty with speech but he has been having dizziness and numbness especially on the left upper and lower extremities.  He reports no fever or chills and denies headache and neck pain and stiffness.  In the ED he had a CT angiogram and perfusion study with findings of no large vessel occlusion but ischemia suggested in the right parietal lobe.  Code stroke was called and he was evaluated by teleneurologist Dr. Tamea who was also concerned about CVA.  He was loaded with clopidogrel and aspirin and admission requested for full stroke workup and MRI.

## 2024-01-31 NOTE — Evaluation (Signed)
 Occupational Therapy Evaluation Patient Details Name: Clayton Jensen MRN: [id number] DOB: 04-Jan-1981 Today's Date: 01/31/2024   History of Present Illness   Clayton Jensen is a 43 year old male with past medical history of obesity, OSA/OHS, viral meningeal encephalitis 6 years ago, tobacco, prediabetes, hyperlipidemia, GAD, chronic lower back pain, ADD who presented to the emergency early this morning stating that he woke up unable to move his left side.  He was last known to be fully well at around 11 PM.  He had no difficulty with speech but he has been having dizziness and numbness especially on the left upper and lower extremities.  He reports no fever or chills and denies headache and neck pain and stiffness.  In the ED he had a CT angiogram and perfusion study with findings of no large vessel occlusion but ischemia suggested in the right parietal lobe.  Code stroke was called and he was evaluated by teleneurologist Dr. Tamea who was also concerned about CVA.  He was loaded with clopidogrel and aspirin and admission requested for full stroke workup and MRI.     Clinical Impressions Pt admitted for concerns listed above. PTA pt reported that he was independent with all ADL's and IADL's, including working. At this time pt presents at his baseline with no assist needed. He reports that all deficits cleared up in the past 2 hours. He has no further skilled OT needs at this time and will be discharged from acute OT.      If plan is discharge home, recommend the following:         Functional Status Assessment   Patient has had a recent decline in their functional status and demonstrates the ability to make significant improvements in function in a reasonable and predictable amount of time.     Equipment Recommendations   None recommended by OT     Recommendations for Other Services         Precautions/Restrictions   Precautions Precautions: None Recall of  Precautions/Restrictions: Intact Restrictions Weight Bearing Restrictions Per Provider Order: No     Mobility Bed Mobility Overal bed mobility: Independent                  Transfers Overall transfer level: Independent                        Balance Overall balance assessment: Independent                                         ADL either performed or assessed with clinical judgement   ADL Overall ADL's : At baseline;Independent                                             Vision Baseline Vision/History: 0 No visual deficits Ability to See in Adequate Light: 0 Adequate Patient Visual Report: No change from baseline Vision Assessment?: No apparent visual deficits     Perception         Praxis         Pertinent Vitals/Pain Pain Assessment Pain Assessment: No/denies pain     Extremity/Trunk Assessment Upper Extremity Assessment Upper Extremity Assessment: Overall WFL for tasks assessed   Lower Extremity Assessment Lower Extremity Assessment: Overall [facility]  for tasks assessed   Cervical / Trunk Assessment Cervical / Trunk Assessment: Normal   Communication Communication Communication: No apparent difficulties   Cognition Arousal: Alert Behavior During Therapy: WFL for tasks assessed/performed Cognition: No apparent impairments                               Following commands: Intact       Cueing  General Comments   Cueing Techniques: Verbal cues  VSS   Exercises     Shoulder Instructions      Home Living Family/patient expects to be discharged to:: Private residence Living Arrangements: Spouse/significant other Available Help at Discharge: Family;Available 24 hours/day Type of Home: House Home Access: Stairs to enter [employer] of Steps: 2 Entrance Stairs-Rails: None Home Layout: Two level;Able to live on main level with bedroom/bathroom;Full bath on main  level Alternate Level Stairs-Number of Steps: Patient states he does not have to go upstairs   Bathroom Shower/Tub: Chief Strategy Officer: Standard Bathroom Accessibility: Yes How Accessible: Accessible via walker Home Equipment: None          Prior Functioning/Environment Prior Level of Function : Independent/Modified Independent;Driving             Mobility Comments: Community ambulation without AD, drives, works ADLs Comments: Independent    OT Problem List: Decreased strength;Decreased activity tolerance   OT Treatment/Interventions:        OT Goals(Current goals can be found in the care plan section)   Acute Rehab OT Goals Patient Stated Goal: To go home OT Goal Formulation: With patient Time For Goal Achievement: 01/31/24 Potential to Achieve Goals: Good   OT Frequency:       Co-evaluation   Reason for Co-Treatment: To address functional/ADL transfers PT goals addressed during session: Mobility/safety with mobility;Balance        AM-PAC OT 6 Clicks Daily Activity     Outcome Measure Help from another person eating meals?: None Help from another person taking care of personal grooming?: None Help from another person toileting, which includes using toliet, bedpan, or urinal?: None Help from another person bathing (including washing, rinsing, drying)?: None Help from another person to put on and taking off regular upper body clothing?: None Help from another person to put on and taking off regular lower body clothing?: None 6 Click Score: 24   End of Session Nurse Communication: Mobility status  Activity Tolerance: Patient tolerated treatment well Patient left: in bed;with call bell/phone within reach  OT Visit Diagnosis: Unsteadiness on feet (R26.81);Other abnormalities of gait and mobility (R26.89);Muscle weakness (generalized) (M62.81)                Time: 8968-8953 OT Time Calculation (min): 15 min Charges:  OT General  Charges $OT Visit: 1 Visit OT Evaluation $OT Eval Moderate Complexity: 1 Mod  Nathasha Fiorillo, OTR/L [facility]  Eustolia Drennen Jillyn Nightingale 01/31/2024, 4:52 PM

## 2024-01-31 NOTE — ED Notes (Signed)
Pt taken to CT with Vicente Males, RN

## 2024-01-31 NOTE — Plan of Care (Signed)
  Problem: Acute Rehab PT Goals(only PT should resolve) Goal: Pt Will Go Supine/Side To Sit Flowsheets (Taken 01/31/2024 1349) Pt will go Supine/Side to Sit: Independently Goal: Patient Will Transfer Sit To/From Stand Flowsheets (Taken 01/31/2024 1349) Patient will transfer sit to/from stand: Independently Goal: Pt Will Transfer Bed To Chair/Chair To Bed Flowsheets (Taken 01/31/2024 1349) Pt will Transfer Bed to Chair/Chair to Bed: Independently Goal: Pt Will Ambulate Flowsheets (Taken 01/31/2024 1349) Pt will Ambulate:  > 125 feet  Independently   1:49 PM, 01/31/24 Lynwood Music, MPT Physical Therapist with [facility] [phone] office [phone] mobile phone

## 2024-02-01 ENCOUNTER — Inpatient Hospital Stay (HOSPITAL_COMMUNITY): Payer: Self-pay

## 2024-02-01 DIAGNOSIS — I6389 Other cerebral infarction: Secondary | ICD-10-CM

## 2024-02-01 LAB — ECHOCARDIOGRAM COMPLETE BUBBLE STUDY
AR max vel: 4.28 cm2
AV Area VTI: 4.68 cm2
AV Area mean vel: 4.28 cm2
AV Mean grad: 2.8 mmHg
AV Peak grad: 6.1 mmHg
Ao pk vel: 1.24 m/s
Area-P 1/2: 3.42 cm2
S' Lateral: 3.8 cm

## 2024-02-01 LAB — HIV ANTIBODY (ROUTINE TESTING W REFLEX): HIV Screen 4th Generation wRfx: NONREACTIVE

## 2024-02-01 MED ORDER — CLOPIDOGREL BISULFATE 75 MG PO TABS: 75.0000 mg | ORAL_TABLET | Freq: Every day | ORAL | 0 refills | Status: DC

## 2024-02-01 MED ORDER — ATORVASTATIN CALCIUM 40 MG PO TABS: 40.0000 mg | ORAL_TABLET | Freq: Every evening | ORAL | 0 refills | Status: DC

## 2024-02-01 MED ORDER — ASPIRIN 81 MG PO TBEC: 81.0000 mg | DELAYED_RELEASE_TABLET | Freq: Every day | ORAL | 3 refills | Status: AC

## 2024-02-01 NOTE — Progress Notes (Signed)
  Echocardiogram 2D Echocardiogram has been performed.  Koleen KANDICE Popper, RDCS 02/01/2024, 9:22 AM

## 2024-02-01 NOTE — Discharge Summary (Signed)
 Physician Discharge Summary  Clayton Jensen DOB: 20-Jun-1980 DOA: 01/31/2024  PCP: Cook, Jayce G, DO  Admit date: 01/31/2024 Discharge date: 02/07/2024  Admitted From: Home Disposition: Home  Recommendations for Outpatient Follow-up:  Follow up with PCP in 1 week, follow-up with neurology in 1 to 2 weeks.  Referral placed. Follow up in ED if recurrence of stroke symptoms. Follow-up echocardiogram report  Home Health: No Equipment/Devices: None  Discharge Condition: Stable CODE STATUS: Full Diet recommendation: Heart healthy  Brief/Interim Summary:  Clayton Jensen is a 43 year old male with past medical history of obesity, OSA/OHS, viral meningeal encephalitis 6 years ago, tobacco, prediabetes, hyperlipidemia, GAD, chronic lower back pain, ADD who presented to the emergency early this morning stating that he woke up unable to move his left side.  He was last known to be fully well at around 11 PM.  He had no difficulty with speech but he has been having dizziness and numbness especially on the left upper and lower extremities.  He reports no fever or chills and denies headache and neck pain and stiffness.  In the ED he had a CT angiogram and perfusion study with findings of no large vessel occlusion but ischemia suggested in the right parietal lobe.  Code stroke was called and he was evaluated by teleneurologist Dr. Tamea who was also concerned about CVA.  He was loaded with clopidogrel and aspirin and admission requested for full stroke workup and MRI.   MRI of the brain subsequently showed restricted diffusion within the right periventricular white matter and thalamocapsular junction consistent with acute ischemia.  Chronic ischemic changes along the frontal horns of the lateral ventricle bilaterally, worse on the right.  Scattered foci of increased T2 signal within the central semiovale bilaterally.   His symptoms resolved.  He is initiated on dual antiplatelet therapy  with aspirin and Plavix for 3 weeks followed by aspirin alone.  Also started on atorvastatin 40 mg daily.  Hemoglobin A1c is within normal limits.  Lipid panel with total cholesterol 164, LDL 105, HDL 41.  Vitamin B12 568.  EKG with normal sinus rhythm.  Echocardiogram completed but read pending at the time of discharge.  Patient is discharged home.  Will follow-up with PCP in 1 week and neurology 1 to 2 weeks.    Discharge Diagnoses:  Active Problems:   History of viral meningitis   Chronic lower back pain   ADD (attention deficit disorder)   Morbid obesity (HCC)   Tobacco abuse   Prediabetes   Hyperlipidemia   Generalized anxiety disorder    Discharge Instructions  Discharge Instructions     Ambulatory referral to Neurology   Complete by: As directed    An appointment is requested in approximately: 2 weeks   Call MD for:  persistant dizziness or light-headedness   Complete by: As directed    Diet - low sodium heart healthy   Complete by: As directed    Discharge instructions   Complete by: As directed    1.  Start taking aspirin, Plavix, atorvastatin.  Take Plavix for 3 weeks and aspirin indefinitely. 2.  Return to the ER if recurrence of neurological symptoms. 3.  Do not take over-the-counter pain medications when taking aspirin and Plavix together.   Increase activity slowly   Complete by: As directed       Allergies as of 02/01/2024       Reactions   Celery Oil Shortness Of Breath, Swelling   Swelling of throat  Medication List     STOP taking these medications    ibuprofen  400 MG tablet Commonly known as: ADVIL        TAKE these medications    aspirin EC 81 MG tablet Take 1 tablet (81 mg total) by mouth daily. Swallow whole.   atorvastatin 40 MG tablet Commonly known as: LIPITOR Take 1 tablet (40 mg total) by mouth every evening.   clopidogrel 75 MG tablet Commonly known as: PLAVIX Take 1 tablet (75 mg total) by mouth daily.    methylphenidate  10 MG tablet Commonly known as: Ritalin  Take 1 tablet (10 mg total) by mouth daily. At 1:30 pm.   methylphenidate  54 MG CR tablet Commonly known as: CONCERTA  Take 1 tablet (54 mg total) by mouth every morning.        Follow-up Information     Cook, Jayce G, DO. Schedule an appointment as soon as possible for a visit in 1 week(s).   Specialty: Family Medicine Contact information: [address] [place] [address] [phone]         [facility]. Go in 2 week(s).   Contact information: [address]     [address] [place]  [address] [phone]               Allergies  Allergen Reactions   Celery Oil Shortness Of Breath and Swelling    Swelling of throat    Consultations:    Procedures/Studies: ECHOCARDIOGRAM COMPLETE BUBBLE STUDY Result Date: 02/01/2024    ECHOCARDIOGRAM REPORT   Patient Name:   Clayton Jensen Date of Exam: 02/01/2024 Medical Rec #:  [id number]      Height:       72.0 in Accession #:    [id number]     Weight:       369.0 lb Date of Birth:  April 14, 1980      BSA:          2.764 m Patient Age:    43 years       BP:           129/75 mmHg Patient Gender: M              HR:           67 bpm. Exam Location:  [facility] Procedure: 2D Echo, 3D Echo, Cardiac Doppler, Color Doppler and Saline Contrast            Bubble Study (Both Spectral and Color Flow Doppler were utilized            during procedure). Indications:    Stroke 434.91  History:        Patient has no prior history of Echocardiogram examinations.                 Stroke, Arrythmias:Bradycardia; Risk Factors:Pre-diabetes,                 Current Smoker and Dyslipidemia.  Sonographer:    Clayton Jensen RDCS Referring Phys: [id number] Clayton Jensen  Sonographer Comments: Patient is obese. Image acquisition challenging due to respiratory motion. IMPRESSIONS  1. Left ventricular ejection fraction, by estimation, is 60 to 65%. The left  ventricle has normal function. The left ventricle has no regional wall motion abnormalities. Left ventricular diastolic parameters were normal.  2. Right ventricular systolic function is normal. The right ventricular size is normal. There is normal pulmonary artery systolic pressure.  3. The mitral valve is normal in structure. No evidence  of mitral valve regurgitation. No evidence of mitral stenosis.  4. The aortic valve has an indeterminant number of cusps. Aortic valve regurgitation is not visualized. No aortic stenosis is present.  5. Aortic dilatation noted. There is mild dilatation of the ascending aorta, measuring 36 mm.  6. The inferior vena cava is normal in size with greater than 50% respiratory variability, suggesting right atrial pressure of 3 mmHg. FINDINGS  Left Ventricle: Left ventricular ejection fraction, by estimation, is 60 to 65%. The left ventricle has normal function. The left ventricle has no regional wall motion abnormalities. The left ventricular internal cavity size was normal in size. There is  no left ventricular hypertrophy. Left ventricular diastolic parameters were normal. Right Ventricle: The right ventricular size is normal. Right vetricular wall thickness was not well visualized. Right ventricular systolic function is normal. There is normal pulmonary artery systolic pressure. The tricuspid regurgitant velocity is 2.34 m/s, and with an assumed right atrial pressure of 3 mmHg, the estimated right ventricular systolic pressure is 24.9 mmHg. Left Atrium: Left atrial size was normal in size. Right Atrium: Right atrial size was normal in size. Pericardium: There is no evidence of pericardial effusion. Mitral Valve: The mitral valve is normal in structure. No evidence of mitral valve regurgitation. No evidence of mitral valve stenosis. Tricuspid Valve: The tricuspid valve is normal in structure. Tricuspid valve regurgitation is trivial. No evidence of tricuspid stenosis. Aortic Valve: The  aortic valve has an indeterminant number of cusps. Aortic valve regurgitation is not visualized. No aortic stenosis is present. Aortic valve mean gradient measures 2.8 mmHg. Aortic valve peak gradient measures 6.1 mmHg. Aortic valve area, by VTI measures 4.68 cm. Pulmonic Valve: The pulmonic valve was not well visualized. Pulmonic valve regurgitation is not visualized. No evidence of pulmonic stenosis. Aorta: The aortic root is normal in size and structure and aortic dilatation noted. There is mild dilatation of the ascending aorta, measuring 36 mm. Venous: The inferior vena cava was not well visualized. The inferior vena cava is normal in size with greater than 50% respiratory variability, suggesting right atrial pressure of 3 mmHg. IAS/Shunts: The interatrial septum was not well visualized. Agitated saline contrast was given intravenously to evaluate for intracardiac shunting. Additional Comments: 3D was performed not requiring image post processing on an independent workstation and was normal.  LEFT VENTRICLE PLAX 2D LVIDd:         5.60 cm   Diastology LVIDs:         3.80 cm   LV e' medial:    12.00 cm/s LV PW:         0.80 cm   LV E/e' medial:  7.5 LV IVS:        1.00 cm   LV e' lateral:   12.60 cm/s LVOT diam:     2.40 cm   LV E/e' lateral: 7.2 LV SV:         114 LV SV Index:   41 LVOT Area:     4.52 cm                           3D Volume EF:                          3D EF:        61 %  LV EDV:       189 ml                          LV ESV:       73 ml                          LV SV:        116 ml RIGHT VENTRICLE          IVC RV Basal diam:  4.30 cm  IVC diam: 2.10 cm LEFT ATRIUM           Index        RIGHT ATRIUM           Index LA diam:      4.40 cm 1.59 cm/m   RA Area:     18.40 cm LA Vol (A4C): 66.7 ml 24.13 ml/m  RA Volume:   47.00 ml  17.00 ml/m  AORTIC VALVE AV Area (Vmax):    4.28 cm AV Area (Vmean):   4.28 cm AV Area (VTI):     4.68 cm AV Vmax:           123.76 cm/s AV  Vmean:          75.799 cm/s AV VTI:            0.243 m AV Peak Grad:      6.1 mmHg AV Mean Grad:      2.8 mmHg LVOT Vmax:         117.00 cm/s LVOT Vmean:        71.700 cm/s LVOT VTI:          0.251 m LVOT/AV VTI ratio: 1.03  AORTA Ao Root diam: 3.20 cm Ao Asc diam:  3.60 cm MITRAL VALVE               TRICUSPID VALVE MV Area (PHT): 3.42 cm    TR Peak grad:   21.9 mmHg MV Decel Time: 222 msec    TR Vmax:        234.00 cm/s MV E velocity: 90.30 cm/s MV A velocity: 59.40 cm/s  SHUNTS MV E/A ratio:  1.52        Systemic VTI:  0.25 m                            Systemic Diam: 2.40 cm Dorn Ross MD Electronically signed by Dorn Ross MD Signature Date/Time: 02/01/2024/4:46:31 PM    Final    MR BRAIN W WO CONTRAST Result Date: 01/31/2024 EXAM: MRI BRAIN WITH AND WITHOUT CONTRAST 01/31/2024 07:28:29 AM TECHNIQUE: Multiplanar multisequence MRI of the head/brain was performed with and without the administration of 10 mL gadobutrol (GADAVIST) 1 MMOL/ML injection. COMPARISON: CT of the head dated 01/31/2024 and MRI of the head dated 04/06/2017. CLINICAL HISTORY: Neuro deficit, acute, stroke suspected. FINDINGS: BRAIN AND VENTRICLES: Restricted diffusion within the right periventricular white matter and thalamocapsular junction. No acute intracranial hemorrhage. No mass effect or midline shift. No hydrocephalus. The sella is unremarkable. Normal flow voids. No mass or abnormal enhancement. There are chronic ischemic changes present along the frontal horns of the lateral ventricles bilaterally, worse on the right. There are scattered foci of increased T2 signal within the centrum semiovale bilaterally. ORBITS: No acute abnormality. SINUSES: No acute abnormality. BONES AND SOFT TISSUES: Normal bone marrow signal and enhancement.  No acute soft tissue abnormality. IMPRESSION: 1. Restricted diffusion within the right periventricular white matter and thalamocapsular junction, consistent with acute ischemic changes. 2.  Chronic ischemic changes along the frontal horns of the lateral ventricles bilaterally, worse on the right. 3. Scattered foci of increased T2 signal within the centrum semiovale bilaterally. Electronically signed by: Evalene Coho MD 01/31/2024 07:42 AM EDT RP Workstation: [id number]   CT ANGIO HEAD NECK W WO CM W PERF (CODE STROKE) LKW > 6h Result Date: 01/31/2024 EXAM: CT BRAIN PERFUSION 01/31/2024 06:18:33 AM TECHNIQUE: Cerebral perfusion analysis using computed tomography with contrast administration, including post-processing of parametric maps with determination of cerebral blood flow, cerebral blood volume, mean transit time and time-to-maximum. Automated exposure control, iterative reconstruction, and/or weight based adjustment of the mA/kV was utilized to reduce the radiation dose to as low as reasonably achievable. 100 mL (iohexol  (OMNIPAQUE ) 350 MG/ML injection 100 mL IOHEXOL  350 MG/ML SOLN) was administered. COMPARISON: CT of the head dated 01/31/2024 and 12/26/2020. CLINICAL HISTORY: Neuro deficit, acute, stroke suspected. Pt states he woke up this morning and was not able to move his left side. LKW was 2300 last night. No slurred speech or facial droop. Pt endorses dizziness and numbness. FINDINGS: CT PERFUSION: EXAM QUALITY: The examination is adequate with diagnostic perfusion maps. No significant motion artifact. Appropriate arterial inflow and venous outflow curves. CORE INFARCT (CBF<30% volume): 0 mL TOTAL HYPOPERFUSION (Tmax>6s volume): 28 mL PENUMBRA: Mismatch volume: 28 mL Mismatch ratio: Not applicable Location: Right parietal lobe. Ischemia is suggested within the right parietal lobe. VASCULATURE: The right vertebral artery is dominant and the left vertebral artery is relatively hypoplastic, uniform in caliber. There is no evidence of large vessel occlusion, flow-limiting stenosis, or cerebral aneurysm. DISCUSSION: The above findings were discussed with Doctor Alm Lias at 6:31 AM  01/31/2024. IMPRESSION: 1. Ischemia suggested within the right parietal lobe with a mismatch volume of 28 mL. 2. No evidence of large vessel occlusion, flow-limiting stenosis, or cerebral aneurysm. 3. Findings discussed with Dr. Alm Lias at 6:31 AM on 01/31/24. Electronically signed by: Evalene Coho MD 01/31/2024 06:36 AM EDT RP Workstation: [id number]   CT HEAD CODE STROKE WO CONTRAST (LKW 0-4.5h, LVO 0-24h) Result Date: 01/31/2024 EXAM: CT HEAD WITHOUT 01/31/2024 06:05:13 AM TECHNIQUE: CT of the head was performed without the administration of intravenous contrast. Automated exposure control, iterative reconstruction, and/or weight based adjustment of the mA/kV was utilized to reduce the radiation dose to as low as reasonably achievable. COMPARISON: CT of the head dated 12/26/2020. CLINICAL HISTORY: Neuro deficit, acute, stroke suspected. Pt states he woke up this morning and was not able to move his left side. LKW was 2300 last night. No slurred speech or facial droop. Pt endorses dizziness and numbness. FINDINGS: BRAIN AND VENTRICLES: No acute intracranial hemorrhage. No mass effect or midline shift. No extra-axial fluid collection. No evidence of acute infarct. No hydrocephalus. Chronic encephalomalacia changes present within the anterior limb of the right internal capsule and periventricular white matter. ORBITS: No acute abnormality. SINUSES AND MASTOIDS: No acute abnormality. SOFT TISSUES AND SKULL: No acute skull fracture. No acute soft tissue abnormality. [place] Stroke Program Early CT Score (ASPECTS) ----- Ganglionic (caudate, IC, lentiform nucleus, insula, M1-M3): 7 Supraganglionic (M4-M6): 3 Total: 10 IMPRESSION: 1. No acute intracranial abnormality. 2. Chronic encephalomalacia changes in the anterior limb of the right internal capsule and periventricular white matter. 3. The above findings were discussed with doctor glik at 6:09 am 01/31/2024. Electronically signed by: Evalene Coho MD  01/31/2024 06:14 AM EDT RP Workstation: [id number]      Subjective:   Discharge Exam: Vitals:   02/01/24 0400 02/01/24 1208  BP: 129/75 (!) 147/78  Pulse: (!) 58 64  Resp:  20  Temp: 97.7 F (36.5 C)   SpO2: 97% 95%    General: Pt is alert, awake, not in acute distress Cardiovascular: rate controlled, S1/S2 + Respiratory: bilateral decreased breath sounds at bases Abdominal: Soft, NT, ND, bowel sounds + Extremities: no edema, no cyanosis    The results of significant diagnostics from this hospitalization (including imaging, microbiology, ancillary and laboratory) are listed below for reference.     Microbiology: No results found for this or any previous visit (from the past 240 hours).   Labs: BNP (last 3 results) No results for input(s): BNP in the last 8760 hours. Basic Metabolic Panel: No results for input(s): NA, K, CL, CO2, GLUCOSE, BUN, CREATININE, CALCIUM , MG, PHOS in the last 168 hours.  Liver Function Tests: No results for input(s): AST, ALT, ALKPHOS, BILITOT, PROT, ALBUMIN in the last 168 hours.  No results for input(s): LIPASE, AMYLASE in the last 168 hours. No results for input(s): AMMONIA in the last 168 hours. CBC: No results for input(s): WBC, NEUTROABS, HGB, HCT, MCV, PLT in the last 168 hours.  Cardiac Enzymes: No results for input(s): CKTOTAL, CKMB, CKMBINDEX, TROPONINI in the last 168 hours. BNP: Invalid input(s): POCBNP CBG: No results for input(s): GLUCAP in the last 168 hours.  D-Dimer No results for input(s): DDIMER in the last 72 hours. Hgb A1c No results for input(s): HGBA1C in the last 72 hours.  Lipid Profile No results for input(s): CHOL, HDL, LDLCALC, TRIG, CHOLHDL, LDLDIRECT in the last 72 hours.  Thyroid  function studies No results for input(s): TSH, T4TOTAL, T3FREE, THYROIDAB in the last 72 hours.  Invalid input(s): FREET3  Anemia  work up No results for input(s): VITAMINB12, FOLATE, FERRITIN, TIBC, IRON, RETICCTPCT in the last 72 hours.  Urinalysis    Component Value Date/Time   COLORURINE YELLOW 04/04/2017 0832   APPEARANCEUR CLEAR 04/04/2017 0832   LABSPEC 1.017 04/04/2017 0832   PHURINE 6.0 04/04/2017 0832   GLUCOSEU NEGATIVE 04/04/2017 0832   HGBUR SMALL (A) 04/04/2017 0832   BILIRUBINUR negative 12/09/2023 1638   KETONESUR negative 12/09/2023 1638   KETONESUR NEGATIVE 04/04/2017 0832   PROTEINUR 100 (A) 04/04/2017 0832   UROBILINOGEN 1.0 12/09/2023 1638   UROBILINOGEN 0.2 10/17/2014 0350   NITRITE Positive (A) 12/09/2023 1638   NITRITE NEGATIVE 04/04/2017 0832   LEUKOCYTESUR Trace (A) 12/09/2023 1638   Sepsis Labs No results for input(s): WBC in the last 168 hours.  Invalid input(s): PROCALCITONIN, LACTICIDVEN  Microbiology No results found for this or any previous visit (from the past 240 hours).   Time coordinating discharge: 35 minutes  SIGNED:   Kiala Faraj, MD  [facility] 02/07/2024, 4:21 PM

## 2024-02-01 NOTE — Progress Notes (Signed)
 At 1713 patient discharged . ECHO results in and Dr Mcarthur advised may discharge

## 2024-02-01 NOTE — Progress Notes (Signed)
 SLP Cancellation Note  Patient Details Name: PRAVIN PEREZPEREZ MRN: [id number] DOB: Sep 03, 1980   Cancelled treatment:       Reason Eval/Treat Not Completed: SLP screened, no needs identified, will sign off   Pat Kees Idrovo,M.S.,CCC-SLP 02/01/2024, 1:15 PM

## 2024-02-02 ENCOUNTER — Telehealth: Payer: Self-pay

## 2024-02-02 NOTE — Transitions of Care (Post Inpatient/ED Visit) (Signed)
   02/02/2024  Name: Clayton Jensen MRN: [id number] DOB: 1981/03/23  Today's TOC FU Call Status: Today's TOC FU Call Status:: Successful TOC FU Call Completed TOC FU Call Complete Date: 02/02/24 Patient's Name and Date of Birth confirmed.  Transition Care Management Follow-up Telephone Call Date of Discharge: 02/01/24 [facility] Type of Discharge: Inpatient Admission Primary Inpatient Discharge Diagnosis:: cerebral infarction How have you been since you were released from the hospital?: Better Any questions or concerns?: No  Items Reviewed: Did you receive and understand the discharge instructions provided?: Yes Medications obtained,verified, and reconciled?: Yes (Medications Reviewed) Any new allergies since your discharge?: No Dietary orders reviewed?: Yes Do you have support at home?: Yes People in Home [RPT]: spouse  Medications Reviewed Today: Medications Reviewed Today     Reviewed by Emmitt Pan, LPN (Licensed Practical Nurse) on 02/02/24 at [phone]  Med List Status: <None>   Medication Order Taking? Sig Documenting Provider Last Dose Status Informant  aspirin EC 81 MG tablet [id number] Yes Take 1 tablet (81 mg total) by mouth daily. Swallow whole. Sigdel, Santosh, MD  Active   atorvastatin (LIPITOR) 40 MG tablet [id number] Yes Take 1 tablet (40 mg total) by mouth every evening. Sigdel, Santosh, MD  Active   clopidogrel (PLAVIX) 75 MG tablet [id number] Yes Take 1 tablet (75 mg total) by mouth daily. Sigdel, Santosh, MD  Active   methylphenidate  (RITALIN ) 10 MG tablet [id number] Yes Take 1 tablet (10 mg total) by mouth daily. At 1:30 pm. Cook, Jayce G, DO  Active Self, Pharmacy Records  methylphenidate  54 MG PO CR tablet [id number] Yes Take 1 tablet (54 mg total) by mouth every morning. Cook, Jayce G, DO  Active Self, Pharmacy Records            Home Care and Equipment/Supplies: Were Home Health Services Ordered?: NA Any new equipment or  medical supplies ordered?: NA  Functional Questionnaire: Do you need assistance with bathing/showering or dressing?: No Do you need assistance with meal preparation?: No Do you need assistance with eating?: No Do you have difficulty maintaining continence: No Do you need assistance with getting out of bed/getting out of a chair/moving?: No Do you have difficulty managing or taking your medications?: No  Follow up appointments reviewed: PCP Follow-up appointment confirmed?: Yes Date of PCP follow-up appointment?: 02/07/24 Follow-up Provider: [facility] Follow-up appointment confirmed?: No Reason Specialist Follow-Up Not Confirmed: Patient has Specialist Provider Number and will Call for Appointment Do you need transportation to your follow-up appointment?: No Do you understand care options if your condition(s) worsen?: Yes-patient verbalized understanding    SIGNATURE Pan Emmitt, LPN [facility] Nurse Health Advisor Direct Dial [phone]

## 2024-02-07 ENCOUNTER — Ambulatory Visit (INDEPENDENT_AMBULATORY_CARE_PROVIDER_SITE_OTHER): Payer: Self-pay | Admitting: Family Medicine

## 2024-02-07 ENCOUNTER — Encounter: Payer: Self-pay | Admitting: Family Medicine

## 2024-02-07 VITALS — BP 108/63 | HR 70 | Ht 72.0 in | Wt 368.0 lb

## 2024-02-07 DIAGNOSIS — Z720 Tobacco use: Secondary | ICD-10-CM

## 2024-02-07 DIAGNOSIS — E291 Testicular hypofunction: Secondary | ICD-10-CM

## 2024-02-07 DIAGNOSIS — Z8673 Personal history of transient ischemic attack (TIA), and cerebral infarction without residual deficits: Secondary | ICD-10-CM

## 2024-02-07 DIAGNOSIS — E785 Hyperlipidemia, unspecified: Secondary | ICD-10-CM

## 2024-02-07 MED ORDER — VARENICLINE TARTRATE (STARTER) 0.5 MG X 11 & 1 MG X 42 PO TBPK: ORAL_TABLET | ORAL | 0 refills | Status: AC

## 2024-02-07 MED ORDER — VARENICLINE TARTRATE 1 MG PO TABS: 1.0000 mg | ORAL_TABLET | Freq: Two times a day (BID) | ORAL | 2 refills | Status: AC

## 2024-02-07 NOTE — Progress Notes (Signed)
 Subjective:  Patient ID: Clayton Jensen, male    DOB: 1980/06/15  Age: 43 y.o. MRN: [id number]  CC:   Chief Complaint  Patient presents with   Hospitalization Follow-up    HPI:  43 year old male presents for hospital follow-up.  Patient admitted from 10/28 to 10/29.  Hospital course reviewed.  Presented with left-sided weakness.  He had some dizziness and numbness/paresthesias as well.  He was found to have an acute stroke.  He was treated with aspirin, Plavix.  LDL was 105.  Was placed on statin therapy.  A1c 5.3.  Normal thyroid  studies.  Patient presents today for follow-up.  He states that he is having some weakness of the left leg particularly the hamstring.  He has also had some paresthesias on the left side as well.  He continues to smoke.  Will discuss smoking cessation today.  Patient interested in getting his testosterone checked as well.  Smoking is his biggest risk factor.  Wife is concerned that he has sleep apnea as well.    Patient Active Problem List   Diagnosis Date Noted   History of stroke 02/07/2024   Generalized anxiety disorder 12/14/2022   Prediabetes 05/18/2022   Hyperlipidemia 05/18/2022   Tobacco abuse 11/19/2021   Morbid obesity (HCC) 04/07/2021   ADD (attention deficit disorder) 03/27/2020   History of empyema of pleura 08/30/2017   Chronic lower back pain    History of viral meningitis 04/06/2017    Social Hx   Social History   Socioeconomic History   Marital status: Married    Spouse name: Not on file   Number of children: Not on file   Years of education: Not on file   Highest education level: Not on file  Occupational History   Not on file  Tobacco Use   Smoking status: Light Smoker    Current packs/day: 0.75    Average packs/day: 0.8 packs/day for 23.0 years (17.3 ttl pk-yrs)    Types: Cigarettes, Cigars   Smokeless tobacco: Never  Vaping Use   Vaping status: Former  Substance and Sexual Activity   Alcohol use: Yes    Comment:  04/05/2017 might have a beer q 2 months   Drug use: No   Sexual activity: Yes  Other Topics Concern   Not on file  Social History Narrative   Not on file   Social Drivers of Health   Financial Resource Strain: Not on file  Food Insecurity: No Food Insecurity (01/31/2024)   Hunger Vital Sign    Worried About Running Out of Food in the Last Year: Never true    Ran Out of Food in the Last Year: Never true  Transportation Needs: No Transportation Needs (01/31/2024)   PRAPARE - Administrator, Civil Service (Medical): No    Lack of Transportation (Non-Medical): No  Physical Activity: Not on file  Stress: Not on file  Social Connections: Moderately Integrated (01/31/2024)   Social Connection and Isolation Panel    Frequency of Communication with Friends and Family: Once a week    Frequency of Social Gatherings with Friends and Family: Once a week    Attends Religious Services: 1 to 4 times per year    Active Member of [employer] or Organizations: Yes    Attends Banker Meetings: 1 to 4 times per year    Marital Status: Married    Review of Systems Per HPI  Objective:  BP 108/63   Pulse 70   Ht  6' (1.829 m)   Wt (!) 368 lb (166.9 kg)   SpO2 98%   BMI 49.91 kg/m      02/07/2024    9:05 AM 02/01/2024   12:08 PM 02/01/2024    4:00 AM  BP/Weight  Systolic BP 108 147 129  Diastolic BP 63 78 75  Wt. (Lbs) 368    BMI 49.91 kg/m2      Physical Exam Vitals and nursing note reviewed.  Constitutional:      General: He is not in acute distress.    Appearance: Normal appearance.  HENT:     Head: Normocephalic and atraumatic.  Eyes:     General:        Right eye: No discharge.        Left eye: No discharge.     Conjunctiva/sclera: Conjunctivae normal.  Cardiovascular:     Rate and Rhythm: Normal rate and regular rhythm.  Pulmonary:     Effort: Pulmonary effort is normal.     Breath sounds: Normal breath sounds. No wheezing, rhonchi or rales.   Neurological:     Mental Status: He is alert.     Lab Results  Component Value Date   WBC 10.7 (H) 01/31/2024   HGB 16.3 01/31/2024   HCT 48.0 01/31/2024   PLT 159 01/31/2024   GLUCOSE 113 (H) 01/31/2024   CHOL 164 01/31/2024   TRIG 86 01/31/2024   HDL 41 01/31/2024   LDLCALC 105 (H) 01/31/2024   ALT 33 01/31/2024   AST 30 01/31/2024   NA 143 01/31/2024   K 4.2 01/31/2024   CL 106 01/31/2024   CREATININE 0.82 01/31/2024   BUN 11 01/31/2024   CO2 30 01/31/2024   TSH 2.440 01/31/2024   INR 1.0 01/31/2024   HGBA1C 5.3 01/31/2024     Assessment & Plan:  History of stroke Assessment & Plan: Overall doing fairly well.  Will continue aspirin and Plavix.  Plavix stopped after 3 weeks of therapy.  Continue statin.  Patient needs to quit smoking.  He is amenable to proceeding with Chantix .   Hypogonadism male -     Testosterone,Free and Total  Tobacco abuse Assessment & Plan: Starting Chantix .  Orders: -     Varenicline  Tartrate (Starter); Days 1 to 3: 0.5 mg once daily. Days 4 to 7: 0.5 mg twice daily. Maintenance (day 8 and later): 1 mg twice daily.  Dispense: 53 each; Refill: 0 -     Varenicline  Tartrate; Take 1 tablet (1 mg total) by mouth 2 (two) times daily.  Dispense: 180 tablet; Refill: 2  Hyperlipidemia, unspecified hyperlipidemia type Assessment & Plan: Will recheck at follow-up.  Continue statin.     Follow-up: Has follow-up in February.  Jacqulyn Ahle DO [facility]

## 2024-02-07 NOTE — Patient Instructions (Signed)
 Work on smoking cessation.  Start Chantix  1 week before quit date.  Lab ordered.  Follow up in 3 months.  Ask neuro about testing for sleep apnea.

## 2024-02-07 NOTE — Assessment & Plan Note (Signed)
 Will recheck at follow-up.  Continue statin.

## 2024-02-07 NOTE — Assessment & Plan Note (Signed)
 Overall doing fairly well.  Will continue aspirin and Plavix.  Plavix stopped after 3 weeks of therapy.  Continue statin.  Patient needs to quit smoking.  He is amenable to proceeding with Chantix .

## 2024-02-07 NOTE — Assessment & Plan Note (Signed)
Starting Chantix. 

## 2024-02-29 ENCOUNTER — Ambulatory Visit (INDEPENDENT_AMBULATORY_CARE_PROVIDER_SITE_OTHER): Payer: Self-pay | Admitting: Neurology

## 2024-02-29 ENCOUNTER — Encounter: Payer: Self-pay | Admitting: Neurology

## 2024-02-29 VITALS — BP 125/77 | HR 72 | Ht 72.0 in | Wt 365.6 lb

## 2024-02-29 DIAGNOSIS — I6381 Other cerebral infarction due to occlusion or stenosis of small artery: Secondary | ICD-10-CM

## 2024-02-29 DIAGNOSIS — Z9189 Other specified personal risk factors, not elsewhere classified: Secondary | ICD-10-CM

## 2024-02-29 DIAGNOSIS — E782 Mixed hyperlipidemia: Secondary | ICD-10-CM

## 2024-02-29 NOTE — Patient Instructions (Signed)
 I had a long d/w patient and his wife about his recent lacunar stroke, risk for recurrent stroke/TIAs, personally independently reviewed imaging studies and stroke evaluation results and answered questions.Continue aspirin  81 mg daily  for secondary stroke prevention and maintain strict control of hypertension with blood pressure goal below 130/90, diabetes with hemoglobin A1c goal below 6.5% and lipids with LDL cholesterol goal below 70 mg/dL. I also advised the patient to eat a healthy diet with plenty of whole grains, cereals, fruits and vegetables, exercise regularly and maintain ideal body weight .follow-up lipid profile today.  Referred for polysomnogram for sleep apnea followup in the future with me in 6 to 8 months or call earlier if necessary    Stroke Prevention Some medical conditions and behaviors can lead to a higher chance of having a stroke. You can help prevent a stroke by eating healthy, exercising, not smoking, and managing any medical conditions you have. Stroke is a leading cause of functional impairment. Primary prevention is particularly important because a majority of strokes are first-time events. Stroke changes the lives of not only those who experience a stroke but also their family and other caregivers. How can this condition affect me? A stroke is a medical emergency and should be treated right away. A stroke can lead to brain damage and can sometimes be life-threatening. If a person gets medical treatment right away, there is a better chance of surviving and recovering from a stroke. What can increase my risk? The following medical conditions may increase your risk of a stroke: Cardiovascular disease. High blood pressure (hypertension). Diabetes. High cholesterol. Sickle cell disease. Blood clotting disorders (hypercoagulable state). Obesity. Sleep disorders (obstructive sleep apnea). Other risk factors include: Being older than age 66. Having a history of blood  clots, stroke, or mini-stroke (transient ischemic attack, TIA). Genetic factors, such as race, ethnicity, or a family history of stroke. Smoking cigarettes or using other tobacco products. Taking birth control pills, especially if you also use tobacco. Heavy use of alcohol or drugs, especially cocaine and methamphetamine. Physical inactivity. What actions can I take to prevent this? Manage your health conditions High cholesterol levels. Eating a healthy diet is important for preventing high cholesterol. If cholesterol cannot be managed through diet alone, you may need to take medicines. Take any prescribed medicines to control your cholesterol as told by your health care provider. Hypertension. To reduce your risk of stroke, try to keep your blood pressure below 130/80. Eating a healthy diet and exercising regularly are important for controlling blood pressure. If these steps are not enough to manage your blood pressure, you may need to take medicines. Take any prescribed medicines to control hypertension as told by your health care provider. Ask your health care provider if you should monitor your blood pressure at home. Have your blood pressure checked every year, even if your blood pressure is normal. Blood pressure increases with age and some medical conditions. Diabetes. Eating a healthy diet and exercising regularly are important parts of managing your blood sugar (glucose). If your blood sugar cannot be managed through diet and exercise, you may need to take medicines. Take any prescribed medicines to control your diabetes as told by your health care provider. Get evaluated for obstructive sleep apnea. Talk to your health care provider about getting a sleep evaluation if you snore a lot or have excessive sleepiness. Make sure that any other medical conditions you have, such as atrial fibrillation or atherosclerosis, are managed. Nutrition Follow instructions from your health  care  provider about what to eat or drink to help manage your health condition. These instructions may include: Reducing your daily calorie intake. Limiting how much salt (sodium) you use to 1,500 milligrams (mg) each day. Using only healthy fats for cooking, such as olive oil, canola oil, or sunflower oil. Eating healthy foods. You can do this by: Choosing foods that are high in fiber, such as whole grains, and fresh fruits and vegetables. Eating at least 5 servings of fruits and vegetables a day. Try to fill one-half of your plate with fruits and vegetables at each meal. Choosing lean protein foods, such as lean cuts of meat, poultry without skin, fish, tofu, beans, and nuts. Eating low-fat dairy products. Avoiding foods that are high in sodium. This can help lower blood pressure. Avoiding foods that have saturated fat, trans fat, and cholesterol. This can help prevent high cholesterol. Avoiding processed and prepared foods. Counting your daily carbohydrate intake.  Lifestyle If you drink alcohol: Limit how much you have to: 0-1 drink a day for women who are not pregnant. 0-2 drinks a day for men. Know how much alcohol is in your drink. In the [place] one drink equals one 12 oz bottle of beer ( ), one 5 oz glass of wine ( ), or one 1 oz glass of hard liquor (44mL). Do not use any products that contain nicotine  or tobacco. These products include cigarettes, chewing tobacco, and vaping devices, such as e-cigarettes. If you need help quitting, ask your health care provider. Avoid secondhand smoke. Do not use drugs. Activity  Try to stay at a healthy weight. Get at least 30 minutes of exercise on most days, such as: Fast walking. Biking. Swimming. Medicines Take over-the-counter and prescription medicines only as told by your health care provider. Aspirin  or blood thinners (antiplatelets or anticoagulants) may be recommended to reduce your risk of forming blood clots that can lead to  stroke. Avoid taking birth control pills. Talk to your health care provider about the risks of taking birth control pills if: You are over 58 years old. You smoke. You get very bad headaches. You have had a blood clot. Where to find more information American Stroke Association: www.strokeassociation.org Get help right away if: You or a loved one has any symptoms of a stroke. BE FAST is an easy way to remember the main warning signs of a stroke: B - Balance. Signs are dizziness, sudden trouble walking, or loss of balance. E - Eyes. Signs are trouble seeing or a sudden change in vision. F - Face. Signs are sudden weakness or numbness of the face, or the face or eyelid drooping on one side. A - Arms. Signs are weakness or numbness in an arm. This happens suddenly and usually on one side of the body. S - Speech. Signs are sudden trouble speaking, slurred speech, or trouble understanding what people say. T - Time. Time to call emergency services. Write down what time symptoms started. You or a loved one has other signs of a stroke, such as: A sudden, severe headache with no known cause. Nausea or vomiting. Seizure. These symptoms may represent a serious problem that is an emergency. Do not wait to see if the symptoms will go away. Get medical help right away. Call your local emergency services (911 in the [place] Do not drive yourself to the hospital. Summary You can help to prevent a stroke by eating healthy, exercising, not smoking, limiting alcohol intake, and managing any medical conditions you may have.  Do not use any products that contain nicotine  or tobacco. These include cigarettes, chewing tobacco, and vaping devices, such as e-cigarettes. If you need help quitting, ask your health care provider. Remember BE FAST for warning signs of a stroke. Get help right away if you or a loved one has any of these signs. This information is not intended to replace advice given to you by your  health care provider. Make sure you discuss any questions you have with your health care provider. Document Revised: 02/22/2022 Document Reviewed: 02/22/2022 Elsevier Patient Education  2024 [employer].

## 2024-02-29 NOTE — Progress Notes (Signed)
 [facility] [address] [place]. [place] [address] [phone]       OFFICE CONSULT NOTE  Clayton Jensen Date of Birth:  09/17/1980 Medical Record Number:  [id number]   Referring MD: Derryl Duval  Reason for Referral: Stroke  HPI: Clayton Jensen is a 43 year old pleasant Caucasian male seen today for initial office consultation visit for stroke.  He is accompanied by his wife.  History is obtained from them and review of electronic medical records and I personally reviewed pertinent available imaging films in PACS. He has past medical history of morbid obesity, obstructive sleep apnea and viral meningoencephalitis 2019, tobacco abuse, prediabetes, hyperlipidemia, generalized anxiety disorder and chronic low back pain and ADHD.  He presented on 01/31/2024 to the emergency room for sudden onset of left-sided weakness.  He woke up from sleep and was unable to walk and fell down.  Probably any headache or speech difficulty CT head showed no acute abnormality.  CT angiogram showed no large vessel stenosis scan showed a right thalamocapsular periventricular subcortical infarct is also with moderate right periventricular infarct noted.  Patient was not aware of echocardiogram showed normal ejection fraction of 60 to 65% and left atrial size was normal.  LDL cholesterol 105 mg percent and hemoglobin A1c 5.3.  Patient was started on dual antiplatelet therapy aspirin  and Plavix .  He states he has done well since discharge.  Sided weakness and numbness recovered fairly quickly following his hospitalization.  He still has occasional feeling of weakness in his left after walking.  He has had no disease.  He has stopped Plavix  and is now on aspirin  alone tolerating well without bruising or bleeding.  Tolerating Lipitor well but he does have occasional muscle pain in his thighs.  The patient has started using Chantix  to quit smoking.  The patient's wife feels he has sleep apnea wanting to be  tested for the same.  He does snore a lot. He has past history of viral meningocephalitis in January 2019 from which he gradually recovered in a month with only mild cognitive issues and anxiety and no motor deficits. 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   OSA (obstructive sleep apnea)    clinical diagnosis, not officially diagnosed per family   PNA (pneumonia) 08/29/2017   Sepsis (HCC) 08/29/2017   Vertigo     Social History:  Social History   Socioeconomic History   Marital status: Married    Spouse name: Not on file   Number of children: Not on file   Years of education: Not on file   Highest education level: Not on file  Occupational History   Not on file  Tobacco Use   Smoking status: Light Smoker    Current packs/day: 0.75    Average packs/day: 0.8 packs/day for 23.0 years (17.3 ttl pk-yrs)    Types: Cigarettes, Cigars   Smokeless tobacco: Never  Vaping Use   Vaping status: Former  Substance and Sexual Activity   Alcohol use: Yes    Comment: 04/05/2017 might have a beer q 2 months   Drug use: No   Sexual activity: Yes  Other Topics Concern   Not  on file  Social History Narrative   Not on file   Social Drivers of Health   Financial Resource Strain: Not on file  Food Insecurity: No Food Insecurity (01/31/2024)   Hunger Vital Sign    Worried About Running Out of Food in the Last Year: Never true    Ran Out of Food in the Last Year: Never true  Transportation Needs: No Transportation Needs (01/31/2024)   PRAPARE - Administrator, Civil Service (Medical): No    Lack of Transportation (Non-Medical): No  Physical Activity: Not on file  Stress: Not on file  Social Connections:  Moderately Integrated (01/31/2024)   Social Connection and Isolation Panel    Frequency of Communication with Friends and Family: Once a week    Frequency of Social Gatherings with Friends and Family: Once a week    Attends Religious Services: 1 to 4 times per year    Active Member of [employer] or Organizations: Yes    Attends Banker Meetings: 1 to 4 times per year    Marital Status: Married  Catering Manager Violence: Not At Risk (01/31/2024)   Humiliation, Afraid, Rape, and Kick questionnaire    Fear of Current or Ex-Partner: No    Emotionally Abused: No    Physically Abused: No    Sexually Abused: No    Medications:   Current Outpatient Medications on File Prior to Visit  Medication Sig Dispense Refill   aspirin  EC 81 MG tablet Take 1 tablet (81 mg total) by mouth daily. Swallow whole. 90 tablet 3   atorvastatin  (LIPITOR) 40 MG tablet Take 1 tablet (40 mg total) by mouth every evening. 30 tablet 0   methylphenidate  (RITALIN ) 10 MG tablet Take 1 tablet (10 mg total) by mouth daily. At 1:30 pm. 30 tablet 0   methylphenidate  54 MG PO CR tablet Take 1 tablet (54 mg total) by mouth every morning. 30 tablet 0   clopidogrel  (PLAVIX ) 75 MG tablet Take 1 tablet (75 mg total) by mouth daily. (Patient not taking: Reported on 02/29/2024) 21 tablet 0   varenicline  (CHANTIX ) 1 MG tablet Take 1 tablet (1 mg total) by mouth 2 (two) times daily. (Patient not taking: Reported on 02/29/2024) 180 tablet 2   Varenicline  Tartrate, Starter, (CHANTIX  STARTING MONTH PAK) 0.5 MG X 11 & 1 MG X 42 TBPK Days 1 to 3: 0.5 mg once daily. Days 4 to 7: 0.5 mg twice daily. Maintenance (day 8 and later): 1 mg twice daily. (Patient not taking: Reported on 02/29/2024) 53 each 0   No current facility-administered medications on file prior to visit.    Allergies:   Allergies  Allergen Reactions   Celery Oil Shortness Of Breath and Swelling    Swelling of throat   Oxycodone -Acetaminophen  Other (See Comments)     Physical Exam General: well developed, well nourished, seated, in no evident distress Head: head normocephalic and atraumatic.   Neck: supple with no carotid or supraclavicular bruits Cardiovascular: regular rate and rhythm, no murmurs Musculoskeletal: no deformity Skin:  no rash/petichiae Vascular:  Normal pulses all extremities  Neurologic Exam Mental Status: Awake and fully alert. Oriented to place and time. Recent and remote memory intact. Attention span, concentration and fund of knowledge appropriate. Mood and affect appropriate.  Cranial Nerves: Fundoscopic exam reveals sharp disc margins. Pupils equal, briskly reactive to light. Extraocular movements full without nystagmus. Visual fields full to confrontation. Hearing intact. Facial sensation intact. Face, tongue, palate moves normally and symmetrically.  Motor: Normal bulk and tone. Normal strength in all tested extremity muscles. Sensory.: intact to touch , pinprick , position and vibratory sensation.  Coordination: Rapid alternating movements normal in all extremities. Finger-to-nose and heel-to-shin performed accurately bilaterally. Gait and Station: Arises from chair without difficulty. Stance is normal. Gait demonstrates normal stride length and balance . Able to heel, toe and tandem walk without difficulty.  Reflexes: 1+ and symmetric. Toes downgoing.   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  Vascular risk factors of morbid obesity, hyperlipidemia, smoking and at risk for sleep apnea     PLAN:I had a long d/w patient and his wife about his recent lacunar stroke, risk for recurrent stroke/TIAs, personally independently reviewed imaging studies and stroke evaluation results and answered questions.Continue aspirin  81 mg daily  for secondary stroke prevention and maintain strict control of hypertension with blood  pressure goal below 130/90, diabetes with hemoglobin A1c goal below 6.5% and lipids with LDL cholesterol goal below 70 mg/dL. I also advised the patient to eat a healthy diet with plenty of whole grains, cereals, fruits and vegetables, exercise regularly and maintain ideal body weight .follow-up lipid profile today.  Referred for polysomnogram for sleep apnea followup in the future with me in 6 to 8 months or call earlier if necessary   I personally spent a total of 50 minutes in the care of the patient today including getting/reviewing separately obtained history, performing a medically appropriate exam/evaluation, counseling and educating, placing orders, referring and communicating with other health care professionals, documenting clinical information in the EHR, independently interpreting results, and coordinating care.        Eather Popp, MD Note: This document was prepared with digital dictation and possible smart phrase technology. Any transcriptional errors that result from this process are unintentional.

## 2024-03-01 LAB — LIPID PANEL
Chol/HDL Ratio: 3.3 ratio (ref 0.0–5.0)
Cholesterol, Total: 127 mg/dL (ref 100–199)
HDL: 39 mg/dL — ABNORMAL LOW (ref >39)
LDL Chol Calc (NIH): 70 mg/dL (ref 0–99)
Triglycerides: 95 mg/dL (ref 0–149)
VLDL Cholesterol Cal: 18 mg/dL (ref 5–40)

## 2024-03-04 ENCOUNTER — Ambulatory Visit: Payer: Self-pay | Admitting: Neurology

## 2024-03-14 ENCOUNTER — Other Ambulatory Visit: Payer: Self-pay | Admitting: Family Medicine

## 2024-03-14 NOTE — Telephone Encounter (Signed)
 Copied from CRM #[id number]. Topic: Clinical - Medication Refill >> Mar 14, 2024  3:42 PM Santiya F wrote: Medication: atorvastatin  (LIPITOR) 40 MG tablet [[id number]]  Has the patient contacted their pharmacy? Yes  (Agent: If yes, when and what did the pharmacy advise?) contact office, [facility] has reached out and not received a response.   This is the patient's preferred pharmacy:  [id number] - [place], [place] - [address] SEC [address] [place] [address] Phone: [phone] Fax: [phone]  Is this the correct pharmacy for this prescription? Yes If no, delete pharmacy and type the correct one.   Has the prescription been filled recently? Yes  Is the patient out of the medication? No, has 2 pills left.   Has the patient been seen for an appointment in the last year OR does the patient have an upcoming appointment? Yes  Can we respond through MyChart? Yes  Agent: Please be advised that Rx refills may take up to 3 business days. We ask that you follow-up with your pharmacy.

## 2024-03-15 ENCOUNTER — Other Ambulatory Visit: Payer: Self-pay | Admitting: Family Medicine

## 2024-03-15 MED ORDER — ATORVASTATIN CALCIUM 40 MG PO TABS: 40.0000 mg | ORAL_TABLET | Freq: Every evening | ORAL | 0 refills | Status: AC

## 2024-05-31 ENCOUNTER — Ambulatory Visit: Payer: Self-pay | Admitting: Family Medicine

## 2025-01-02 ENCOUNTER — Ambulatory Visit: Payer: Self-pay | Admitting: Neurology
